# Patient Record
Sex: Female | Born: 1955 | Race: Black or African American | Hispanic: No | Marital: Single | Smoking: Never smoker
Health system: Southern US, Community
[De-identification: ages and names within clinical notes are randomized; demographics above are authoritative.]

## PROBLEM LIST (undated history)

## (undated) DIAGNOSIS — I10 Essential (primary) hypertension: Secondary | ICD-10-CM

## (undated) DIAGNOSIS — M62838 Other muscle spasm: Secondary | ICD-10-CM

## (undated) DIAGNOSIS — E119 Type 2 diabetes mellitus without complications: Secondary | ICD-10-CM

---

## 2006-06-15 ENCOUNTER — Ambulatory Visit (HOSPITAL_COMMUNITY): Admission: RE | Admit: 2006-06-15 | Discharge: 2006-06-15 | Payer: Self-pay | Admitting: Family Medicine

## 2006-09-21 ENCOUNTER — Ambulatory Visit (HOSPITAL_COMMUNITY): Admission: RE | Admit: 2006-09-21 | Discharge: 2006-09-21 | Payer: Self-pay | Admitting: Family Medicine

## 2007-06-19 ENCOUNTER — Ambulatory Visit (HOSPITAL_COMMUNITY): Admission: RE | Admit: 2007-06-19 | Discharge: 2007-06-19 | Payer: Self-pay | Admitting: Family Medicine

## 2008-08-12 ENCOUNTER — Ambulatory Visit (HOSPITAL_COMMUNITY): Admission: RE | Admit: 2008-08-12 | Discharge: 2008-08-12 | Payer: Self-pay | Admitting: Family Medicine

## 2010-08-09 ENCOUNTER — Encounter: Payer: Self-pay | Admitting: Family Medicine

## 2011-01-22 ENCOUNTER — Emergency Department (HOSPITAL_COMMUNITY)
Admission: EM | Admit: 2011-01-22 | Discharge: 2011-01-22 | Disposition: A | Discharge status: Home / Self Care | Payer: No Typology Code available for payment source | Attending: Emergency Medicine | Admitting: Emergency Medicine

## 2011-01-22 DIAGNOSIS — Y32XXXA Crashing of motor vehicle, undetermined intent, initial encounter: Secondary | ICD-10-CM | POA: Insufficient documentation

## 2011-01-22 DIAGNOSIS — IMO0002 Reserved for concepts with insufficient information to code with codable children: Secondary | ICD-10-CM | POA: Insufficient documentation

## 2011-01-22 DIAGNOSIS — M542 Cervicalgia: Secondary | ICD-10-CM | POA: Insufficient documentation

## 2011-05-05 ENCOUNTER — Other Ambulatory Visit (HOSPITAL_COMMUNITY): Payer: Self-pay | Admitting: Preventative Medicine

## 2011-05-05 DIAGNOSIS — M542 Cervicalgia: Secondary | ICD-10-CM

## 2011-05-06 ENCOUNTER — Other Ambulatory Visit (HOSPITAL_COMMUNITY): Payer: No Typology Code available for payment source

## 2011-05-07 ENCOUNTER — Ambulatory Visit (HOSPITAL_COMMUNITY)
Admission: RE | Admit: 2011-05-07 | Discharge: 2011-05-07 | Disposition: A | Discharge status: Home / Self Care | Payer: No Typology Code available for payment source | Source: Ambulatory Visit | Attending: Preventative Medicine | Admitting: Preventative Medicine

## 2011-05-07 DIAGNOSIS — M542 Cervicalgia: Secondary | ICD-10-CM | POA: Insufficient documentation

## 2011-05-07 DIAGNOSIS — R209 Unspecified disturbances of skin sensation: Secondary | ICD-10-CM | POA: Insufficient documentation

## 2012-10-03 ENCOUNTER — Telehealth (HOSPITAL_COMMUNITY): Payer: Self-pay | Admitting: Dietician

## 2012-10-03 NOTE — Telephone Encounter (Signed)
Pt registered at attend group diabetes class at [facility] on 10/03/12. However, pt was a no-show.

## 2012-10-12 ENCOUNTER — Telehealth (HOSPITAL_COMMUNITY): Payer: Self-pay | Admitting: Dietician

## 2012-10-12 NOTE — Telephone Encounter (Signed)
Pt registered at attend group diabetes class at [facility] on 10/12/12. However, pt was a no-show.   

## 2012-10-17 ENCOUNTER — Encounter (HOSPITAL_COMMUNITY): Payer: Self-pay | Admitting: Dietician

## 2012-10-17 NOTE — Progress Notes (Signed)
[facility] Diabetes Class Completion  Date:October 17, 2012  Time: 1000  Pt attended [facility]'s Diabetes Group Education Class on October 17, 2012.   Patient was educated on the following topics: survival skills (signs and symptoms of hyperglycemia and hypoglycemia, treatment for hypoglycemia, ideal levels for fasting and postprandial blood sugars, goal Hgb A1c level, foot care basics), recommendations for physical activity, carbohydrate metabolism in relation to diabetes, and meal planning (sources of carbohydrate, carbohydrate counting, meal planning strategies, food label reading, and portion control).   Melody Haver, RD, LDN

## 2013-05-29 ENCOUNTER — Other Ambulatory Visit (HOSPITAL_COMMUNITY): Payer: Self-pay | Admitting: *Deleted

## 2013-05-29 DIAGNOSIS — Z1231 Encounter for screening mammogram for malignant neoplasm of breast: Secondary | ICD-10-CM

## 2013-06-15 ENCOUNTER — Ambulatory Visit (HOSPITAL_COMMUNITY): Payer: No Typology Code available for payment source

## 2013-07-02 ENCOUNTER — Ambulatory Visit (HOSPITAL_COMMUNITY)
Admission: RE | Admit: 2013-07-02 | Discharge: 2013-07-02 | Disposition: A | Discharge status: Home / Self Care | Payer: Self-pay | Source: Ambulatory Visit | Attending: *Deleted | Admitting: *Deleted

## 2013-07-02 DIAGNOSIS — Z1231 Encounter for screening mammogram for malignant neoplasm of breast: Secondary | ICD-10-CM | POA: Insufficient documentation

## 2013-07-11 ENCOUNTER — Other Ambulatory Visit: Payer: Self-pay | Admitting: *Deleted

## 2013-07-11 DIAGNOSIS — R928 Other abnormal and inconclusive findings on diagnostic imaging of breast: Secondary | ICD-10-CM

## 2019-08-28 ENCOUNTER — Emergency Department (HOSPITAL_COMMUNITY)
Admission: EM | Admit: 2019-08-28 | Discharge: 2019-08-28 | Disposition: A | Discharge status: Home / Self Care | Payer: Self-pay | Attending: Emergency Medicine | Admitting: Emergency Medicine

## 2019-08-28 ENCOUNTER — Other Ambulatory Visit: Payer: Self-pay

## 2019-08-28 ENCOUNTER — Emergency Department (HOSPITAL_COMMUNITY): Payer: Self-pay

## 2019-08-28 ENCOUNTER — Encounter (HOSPITAL_COMMUNITY): Payer: Self-pay | Admitting: Emergency Medicine

## 2019-08-28 DIAGNOSIS — W08XXXA Fall from other furniture, initial encounter: Secondary | ICD-10-CM | POA: Insufficient documentation

## 2019-08-28 DIAGNOSIS — Y999 Unspecified external cause status: Secondary | ICD-10-CM | POA: Insufficient documentation

## 2019-08-28 DIAGNOSIS — M25531 Pain in right wrist: Secondary | ICD-10-CM | POA: Insufficient documentation

## 2019-08-28 DIAGNOSIS — M25511 Pain in right shoulder: Secondary | ICD-10-CM | POA: Insufficient documentation

## 2019-08-28 DIAGNOSIS — Z7984 Long term (current) use of oral hypoglycemic drugs: Secondary | ICD-10-CM | POA: Insufficient documentation

## 2019-08-28 DIAGNOSIS — Y92099 Unspecified place in other non-institutional residence as the place of occurrence of the external cause: Secondary | ICD-10-CM | POA: Insufficient documentation

## 2019-08-28 DIAGNOSIS — E119 Type 2 diabetes mellitus without complications: Secondary | ICD-10-CM | POA: Insufficient documentation

## 2019-08-28 DIAGNOSIS — Y9389 Activity, other specified: Secondary | ICD-10-CM | POA: Insufficient documentation

## 2019-08-28 HISTORY — DX: Type 2 diabetes mellitus without complications: E11.9

## 2019-08-28 HISTORY — DX: Other muscle spasm: M62.838

## 2019-08-28 HISTORY — DX: Essential (primary) hypertension: I10

## 2019-08-28 NOTE — ED Triage Notes (Signed)
PT states she fell about 3 weeks ago and since then has been having right shoulder and right wrist pain with ROM.

## 2019-08-28 NOTE — Discharge Instructions (Addendum)
Your xrays are negative for any acute injury from your fall or any arthritis in your shoulder or wrist.  It is possible you have a muscle or tendon injury which will not show up on the tests we have available in the emergency department.  I recommend continuing your naproxen and your muscle relaxer.  I also recommend taking a tylenol (acetaminophen) 650 mg tablet every 12 hours also for pain relief.  Apply a heating pad for 15-20 minutes three time daily.  Call the orthopedist listed above if your symptoms do not improve with this treatment over the next 10-14 days.
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
## 2021-02-23 ENCOUNTER — Encounter: Payer: Self-pay | Admitting: *Deleted

## 2021-02-24 ENCOUNTER — Telehealth: Payer: Self-pay | Admitting: *Deleted

## 2021-02-24 ENCOUNTER — Ambulatory Visit: Payer: Self-pay | Admitting: Neurology

## 2021-02-24 ENCOUNTER — Encounter: Payer: Self-pay | Admitting: Neurology

## 2021-02-24 NOTE — Telephone Encounter (Signed)
Patient no showed appt with Dr Frances Furbish this AM. This is her first no show at [facility].

## 2021-02-25 ENCOUNTER — Ambulatory Visit: Payer: Self-pay | Admitting: Neurology

## 2021-03-29 IMAGING — DX DG SHOULDER 2+V*R*
3 series · 3 of 3 positions shown · non-contrast
Comparison: None.

CLINICAL DATA: Fall 3 weeks ago.  Persistent right shoulder pain.

EXAM:
RIGHT SHOULDER - 2+ VIEW

[shoulder grashey]
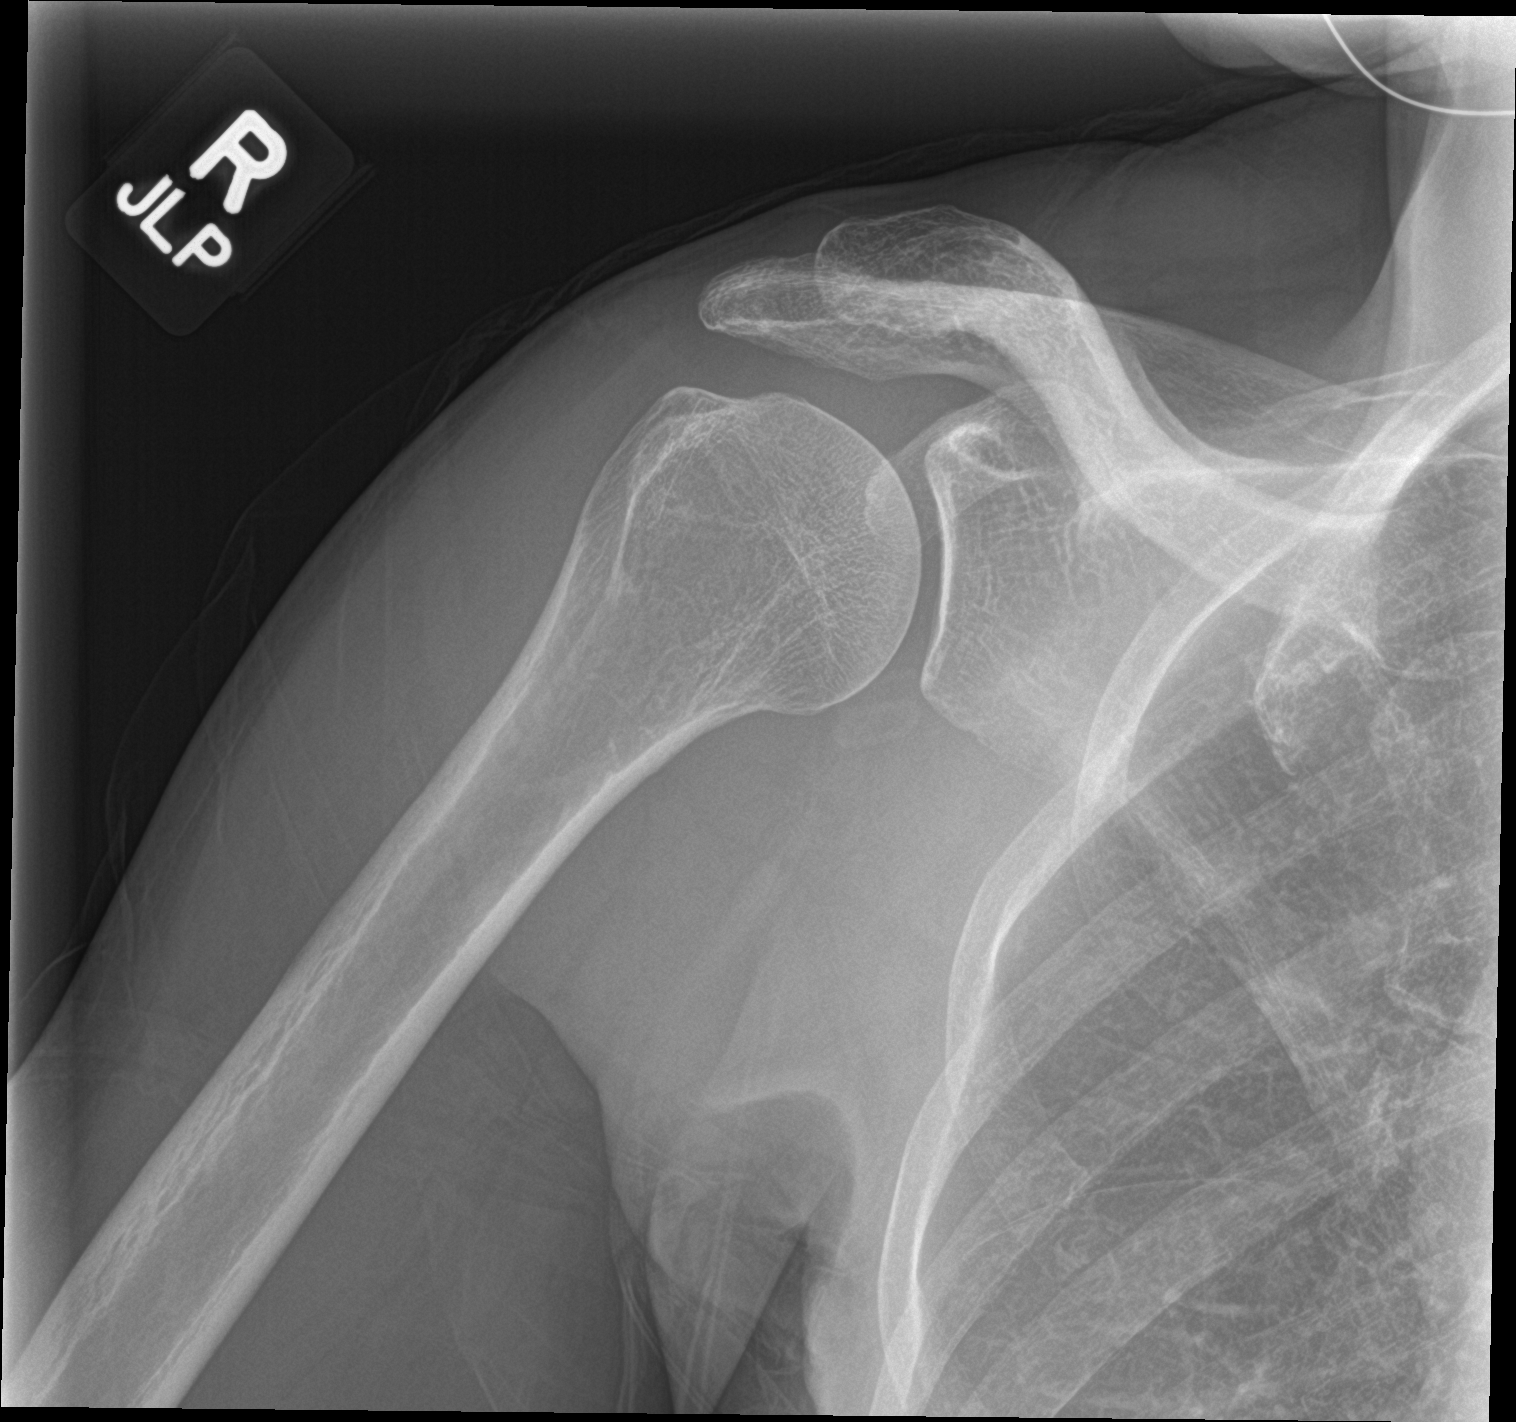

[shoulder y view]
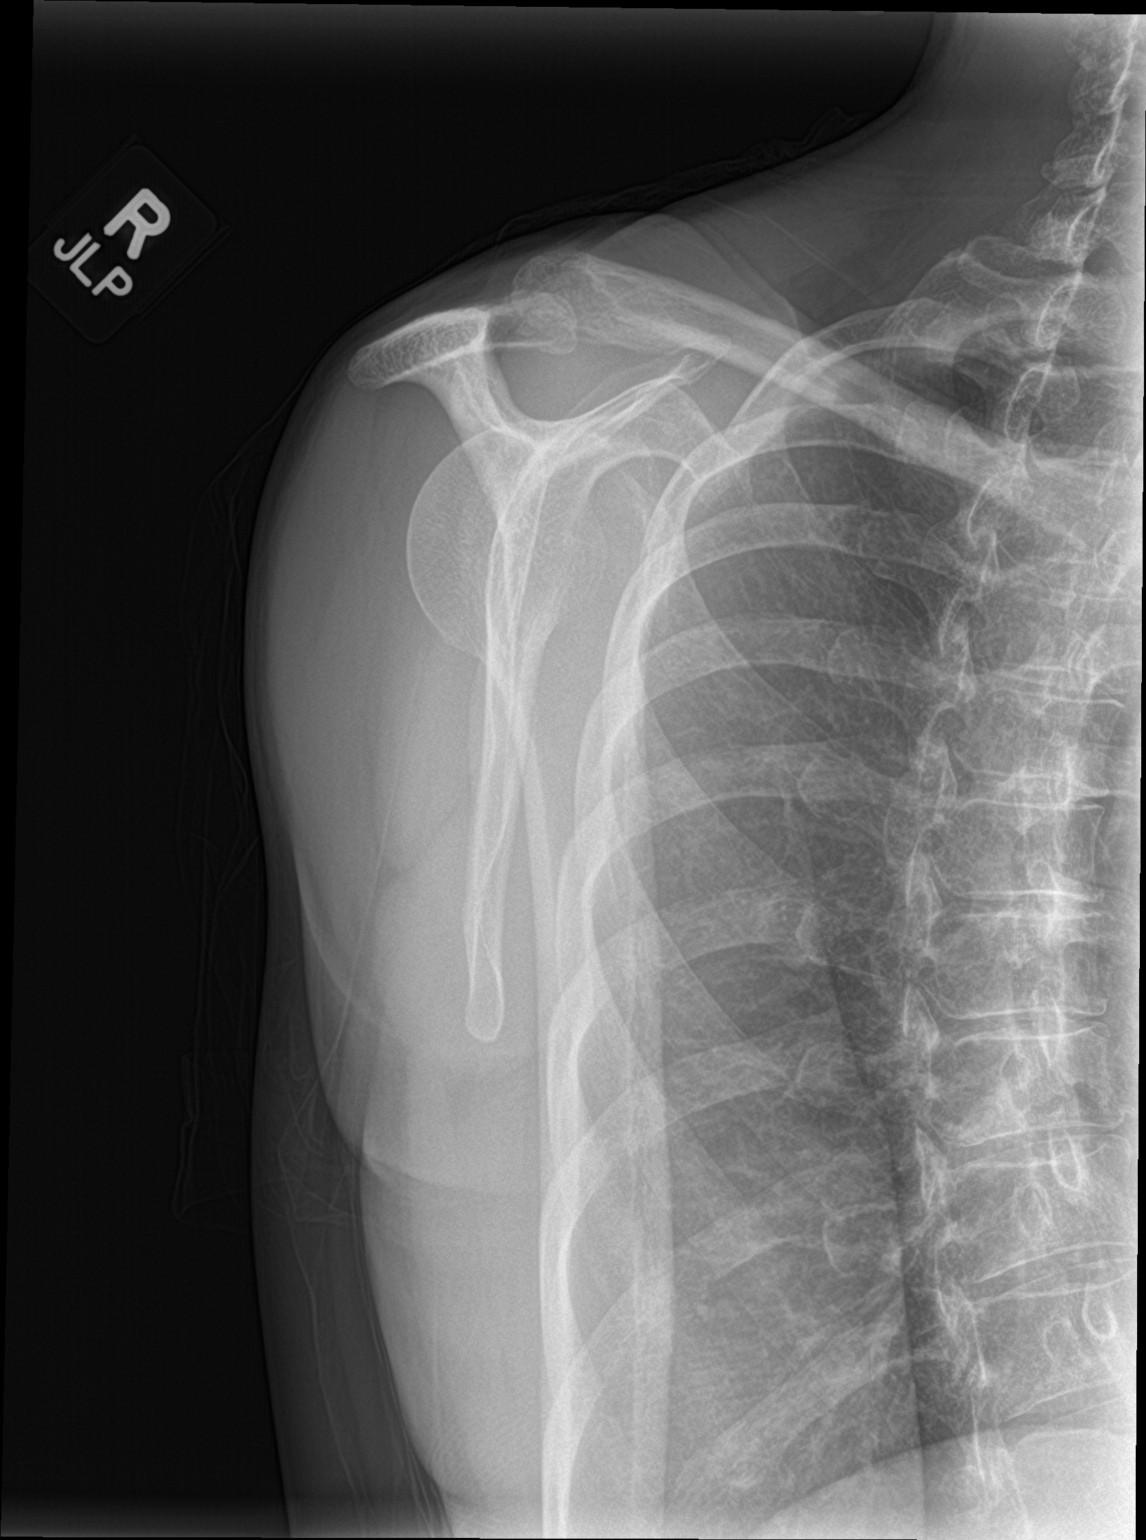

[shoulder axillary]
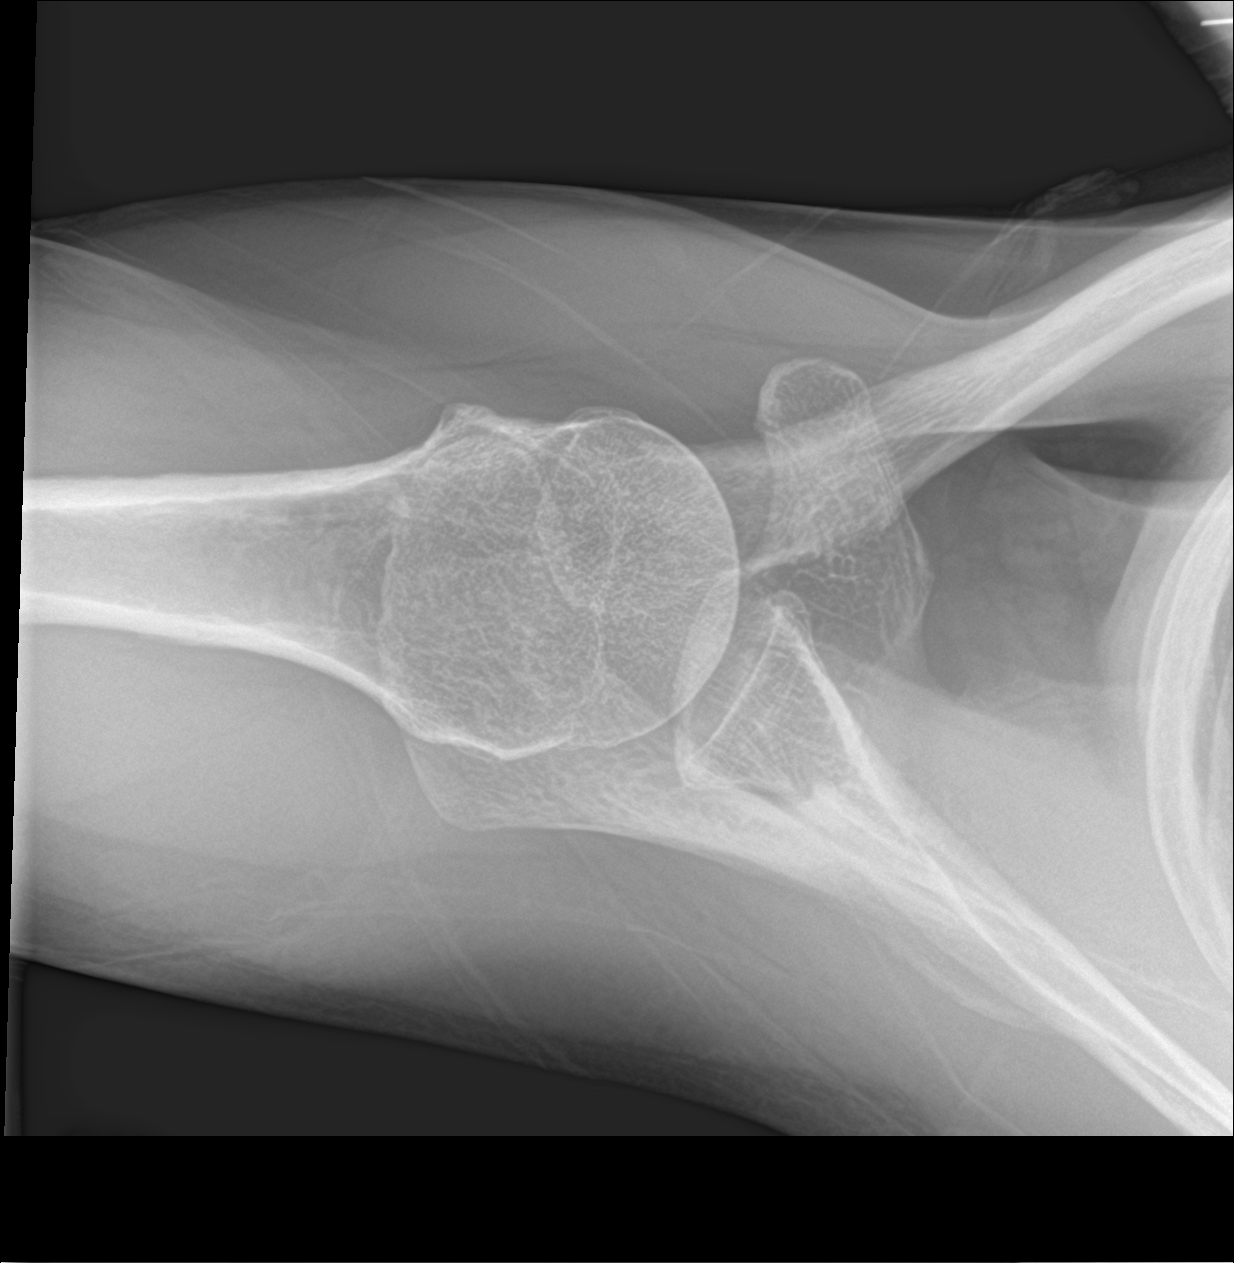

[3 of 3 positions shown; findings below may reference images not displayed]

FINDINGS: There is no evidence of fracture or dislocation. There is no
evidence of arthropathy or other focal bone abnormality. Soft
tissues are unremarkable.
IMPRESSION: Negative right shoulder radiographs

## 2023-08-12 ENCOUNTER — Emergency Department (HOSPITAL_COMMUNITY): Payer: Medicare Other

## 2023-08-12 ENCOUNTER — Encounter (HOSPITAL_COMMUNITY): Payer: Self-pay | Admitting: *Deleted

## 2023-08-12 ENCOUNTER — Inpatient Hospital Stay (HOSPITAL_COMMUNITY)
Admission: EM | Admit: 2023-08-12 | Discharge: 2023-09-13 | DRG: 689 | Disposition: A | Discharge status: Skilled Nursing Facility | Payer: Medicare Other | Attending: Internal Medicine | Admitting: Internal Medicine

## 2023-08-12 ENCOUNTER — Other Ambulatory Visit: Payer: Self-pay

## 2023-08-12 DIAGNOSIS — E538 Deficiency of other specified B group vitamins: Secondary | ICD-10-CM | POA: Diagnosis present

## 2023-08-12 DIAGNOSIS — N390 Urinary tract infection, site not specified: Secondary | ICD-10-CM | POA: Diagnosis not present

## 2023-08-12 DIAGNOSIS — F03C0 Unspecified dementia, severe, without behavioral disturbance, psychotic disturbance, mood disturbance, and anxiety: Secondary | ICD-10-CM | POA: Insufficient documentation

## 2023-08-12 DIAGNOSIS — Z91010 Allergy to peanuts: Secondary | ICD-10-CM | POA: Diagnosis not present

## 2023-08-12 DIAGNOSIS — Z7984 Long term (current) use of oral hypoglycemic drugs: Secondary | ICD-10-CM | POA: Diagnosis not present

## 2023-08-12 DIAGNOSIS — F05 Delirium due to known physiological condition: Secondary | ICD-10-CM | POA: Diagnosis present

## 2023-08-12 DIAGNOSIS — Z9181 History of falling: Secondary | ICD-10-CM

## 2023-08-12 DIAGNOSIS — E860 Dehydration: Secondary | ICD-10-CM | POA: Diagnosis present

## 2023-08-12 DIAGNOSIS — Z886 Allergy status to analgesic agent status: Secondary | ICD-10-CM | POA: Diagnosis not present

## 2023-08-12 DIAGNOSIS — Z5919 Other inadequate housing: Secondary | ICD-10-CM | POA: Diagnosis not present

## 2023-08-12 DIAGNOSIS — R569 Unspecified convulsions: Secondary | ICD-10-CM | POA: Diagnosis not present

## 2023-08-12 DIAGNOSIS — Z79899 Other long term (current) drug therapy: Secondary | ICD-10-CM | POA: Diagnosis not present

## 2023-08-12 DIAGNOSIS — F0390 Unspecified dementia without behavioral disturbance: Secondary | ICD-10-CM | POA: Diagnosis not present

## 2023-08-12 DIAGNOSIS — Z91013 Allergy to seafood: Secondary | ICD-10-CM

## 2023-08-12 DIAGNOSIS — Z880 Allergy status to penicillin: Secondary | ICD-10-CM | POA: Diagnosis not present

## 2023-08-12 DIAGNOSIS — N179 Acute kidney failure, unspecified: Secondary | ICD-10-CM | POA: Diagnosis not present

## 2023-08-12 DIAGNOSIS — Z781 Physical restraint status: Secondary | ICD-10-CM

## 2023-08-12 DIAGNOSIS — N3001 Acute cystitis with hematuria: Principal | ICD-10-CM | POA: Diagnosis present

## 2023-08-12 DIAGNOSIS — R410 Disorientation, unspecified: Secondary | ICD-10-CM

## 2023-08-12 DIAGNOSIS — R4189 Other symptoms and signs involving cognitive functions and awareness: Secondary | ICD-10-CM | POA: Diagnosis not present

## 2023-08-12 DIAGNOSIS — E119 Type 2 diabetes mellitus without complications: Secondary | ICD-10-CM | POA: Diagnosis present

## 2023-08-12 DIAGNOSIS — D509 Iron deficiency anemia, unspecified: Secondary | ICD-10-CM | POA: Diagnosis present

## 2023-08-12 DIAGNOSIS — I10 Essential (primary) hypertension: Secondary | ICD-10-CM | POA: Diagnosis present

## 2023-08-12 DIAGNOSIS — L89022 Pressure ulcer of left elbow, stage 2: Secondary | ICD-10-CM | POA: Diagnosis present

## 2023-08-12 DIAGNOSIS — M62838 Other muscle spasm: Secondary | ICD-10-CM | POA: Diagnosis present

## 2023-08-12 DIAGNOSIS — F03C18 Unspecified dementia, severe, with other behavioral disturbance: Secondary | ICD-10-CM | POA: Diagnosis present

## 2023-08-12 DIAGNOSIS — I959 Hypotension, unspecified: Secondary | ICD-10-CM | POA: Diagnosis not present

## 2023-08-12 DIAGNOSIS — L89012 Pressure ulcer of right elbow, stage 2: Secondary | ICD-10-CM | POA: Diagnosis present

## 2023-08-12 DIAGNOSIS — Z881 Allergy status to other antibiotic agents status: Secondary | ICD-10-CM | POA: Diagnosis not present

## 2023-08-12 DIAGNOSIS — L8990 Pressure ulcer of unspecified site, unspecified stage: Secondary | ICD-10-CM | POA: Diagnosis present

## 2023-08-12 DIAGNOSIS — G9341 Metabolic encephalopathy: Secondary | ICD-10-CM | POA: Diagnosis present

## 2023-08-12 DIAGNOSIS — W57XXXA Bitten or stung by nonvenomous insect and other nonvenomous arthropods, initial encounter: Secondary | ICD-10-CM | POA: Diagnosis present

## 2023-08-12 DIAGNOSIS — N3000 Acute cystitis without hematuria: Secondary | ICD-10-CM | POA: Diagnosis not present

## 2023-08-12 DIAGNOSIS — Z91018 Allergy to other foods: Secondary | ICD-10-CM

## 2023-08-12 DIAGNOSIS — E1165 Type 2 diabetes mellitus with hyperglycemia: Secondary | ICD-10-CM | POA: Diagnosis not present

## 2023-08-12 LAB — RAPID URINE DRUG SCREEN, HOSP PERFORMED
Amphetamines: NOT DETECTED
Barbiturates: NOT DETECTED
Benzodiazepines: POSITIVE — AB
Cocaine: NOT DETECTED
Opiates: NOT DETECTED
Tetrahydrocannabinol: NOT DETECTED

## 2023-08-12 LAB — CBC WITH DIFFERENTIAL/PLATELET
Abs Immature Granulocytes: 0 10*3/uL (ref 0.00–0.07)
Basophils Absolute: 0.1 10*3/uL (ref 0.0–0.1)
Basophils Relative: 1 %
Eosinophils Absolute: 0.2 10*3/uL (ref 0.0–0.5)
Eosinophils Relative: 4 %
HCT: 35.9 % — ABNORMAL LOW (ref 36.0–46.0)
Hemoglobin: 12.4 g/dL (ref 12.0–15.0)
Immature Granulocytes: 0 %
Lymphocytes Relative: 43 %
Lymphs Abs: 2.5 10*3/uL (ref 0.7–4.0)
MCH: 27 pg (ref 26.0–34.0)
MCHC: 34.5 g/dL (ref 30.0–36.0)
MCV: 78.2 fL — ABNORMAL LOW (ref 80.0–100.0)
Monocytes Absolute: 0.3 10*3/uL (ref 0.1–1.0)
Monocytes Relative: 5 %
Neutro Abs: 2.7 10*3/uL (ref 1.7–7.7)
Neutrophils Relative %: 47 %
Platelets: 272 10*3/uL (ref 150–400)
RBC: 4.59 MIL/uL (ref 3.87–5.11)
RDW: 14.4 % (ref 11.5–15.5)
WBC: 5.8 10*3/uL (ref 4.0–10.5)
nRBC: 0 % (ref 0.0–0.2)

## 2023-08-12 LAB — URINALYSIS, ROUTINE W REFLEX MICROSCOPIC
Bilirubin Urine: NEGATIVE
Glucose, UA: NEGATIVE mg/dL
Hgb urine dipstick: NEGATIVE
Ketones, ur: NEGATIVE mg/dL
Nitrite: NEGATIVE
Protein, ur: 30 mg/dL — AB
Specific Gravity, Urine: 1.021 (ref 1.005–1.030)
pH: 6 (ref 5.0–8.0)

## 2023-08-12 LAB — COMPREHENSIVE METABOLIC PANEL
ALT: 18 U/L (ref 0–44)
AST: 28 U/L (ref 15–41)
Albumin: 3.8 g/dL (ref 3.5–5.0)
Alkaline Phosphatase: 66 U/L (ref 38–126)
Anion gap: 8 (ref 5–15)
BUN: 14 mg/dL (ref 8–23)
CO2: 25 mmol/L (ref 22–32)
Calcium: 9.3 mg/dL (ref 8.9–10.3)
Chloride: 105 mmol/L (ref 98–111)
Creatinine, Ser: 0.89 mg/dL (ref 0.44–1.00)
GFR, Estimated: >60 mL/min (ref >60)
Glucose, Bld: 108 mg/dL — ABNORMAL HIGH (ref 70–99)
Potassium: 3.9 mmol/L (ref 3.5–5.1)
Sodium: 138 mmol/L (ref 135–145)
Total Bilirubin: 0.7 mg/dL (ref 0.0–1.2)
Total Protein: 8.5 g/dL — ABNORMAL HIGH (ref 6.5–8.1)

## 2023-08-12 LAB — CBG MONITORING, ED: Glucose-Capillary: 106 mg/dL — ABNORMAL HIGH (ref 70–99)

## 2023-08-12 LAB — ETHANOL: Alcohol, Ethyl (B): <10 mg/dL (ref <10)

## 2023-08-12 MED ORDER — ZIPRASIDONE MESYLATE 20 MG IM SOLR
10.0000 mg | Freq: Once | INTRAMUSCULAR | Status: AC
  Administered 2023-08-12: 10 mg via INTRAMUSCULAR
  Filled 2023-08-12: qty 20

## 2023-08-12 MED ORDER — DIPHENHYDRAMINE HCL 25 MG PO CAPS
25.0000 mg | ORAL_CAPSULE | Freq: Once | ORAL | Status: AC
  Administered 2023-08-13: 25 mg via ORAL
  Filled 2023-08-12: qty 1

## 2023-08-12 MED ORDER — SODIUM CHLORIDE 0.9 % IV SOLN
2.0000 g | Freq: Once | INTRAVENOUS | Status: AC
  Administered 2023-08-12: 2 g via INTRAVENOUS
  Filled 2023-08-12: qty 20

## 2023-08-12 NOTE — ED Triage Notes (Signed)
Pt brought in due to confusion and visual and auditory hallucinations.  Pt with known bed bugs and bites all over.  POA reports that pt is not showering or taking her medications

## 2023-08-12 NOTE — H&P (Signed)
History and Physical    Patient: Shannon Barrett ZOX:[id number] DOB: Mar 10, 1956 DOA: 08/12/2023 DOS: the patient was seen and examined on 08/13/2023 PCP: [facility]  Patient coming from: Home  Chief Complaint:  Chief Complaint  Patient presents with   Altered Mental Status   HPI: Shannon Barrett is a 68 y.o. female with medical history significant of hypertension, T2DM who presents to the emergency department due to confusion, visual and auditory hallucinations.  Patient was confused and was unable to provide history, history was obtained from ED physician and ED medical record.  Per report, patient has not been compliant with home medications and has not been taking showers.  She was reported to get lost whenever she goes out.  No further history was obtainable at this time.  ED Course:  In the emergency department, BP was 176/81, other vital signs are within normal range.  Workup in the ED showed microcytic anemia, BMP was normal except for blood glucose of 108.  Urinalysis was positive for large leukocytes and 21-50 WBC, urine drug screen was positive for benzodiazepine, alcohol level was less than 10. CT of head without contrast showed no evidence of acute intracranial abnormality. Chest x-ray showed no active cardiopulmonary disease. IM Geodon 10 mg x 1, IM Haldol 2 mg x 1 was given due to agitation, Benadryl 25 mg p.o. x 1 was given and IV ceftriaxone due to presumed UTI was given. Hospitalist was asked to admit patient for further evaluation and management.  Review of Systems: Review of systems as noted in the HPI. All other systems reviewed and are negative.   Past Medical History:  Diagnosis Date   Diabetes mellitus without complication (HCC)    Hypertension    Night muscle spasms    History reviewed. No pertinent surgical history.  Social History:  reports that she has never smoked. She has never used smokeless tobacco. She reports that she does  not drink alcohol and does not use drugs.   Allergies  Allergen Reactions   Amoxicillin    Aspirin Hives   Claritin [Loratadine]    Ibuprofen    Peanut-Containing Drug Products    Penicillins    Shellfish-Derived Products    Strawberry (Diagnostic)     History reviewed. No pertinent family history.    Prior to Admission medications   Medication Sig Start Date End Date Taking? Authorizing Provider  albuterol (PROVENTIL HFA) 108 (90 Base) MCG/ACT inhaler Inhale 2 puffs into the lungs every 4 (four) hours as needed for wheezing or shortness of breath.    [provider]  cetirizine (ZYRTEC) 10 MG tablet Take 10 mg by mouth daily.    [provider]  cyclobenzaprine (FLEXERIL) 10 MG tablet Take 10 mg by mouth 2 (two) times daily as needed for muscle spasms.    [provider]  lisinopril-hydrochlorothiazide (ZESTORETIC) 20-12.5 MG tablet Take 1 tablet by mouth daily.    [provider]  metFORMIN (GLUCOPHAGE-XR) 500 MG 24 hr tablet Take 500 mg by mouth in the morning and at bedtime.    [provider]  naproxen (NAPROSYN) 500 MG tablet Take 500 mg by mouth 2 (two) times daily as needed.    [provider]    Physical Exam: BP (!) 185/85   Pulse 66   Temp 97.9 F (36.6 C) (Oral)   Resp 17   Ht 5\' 2"  (1.575 m)   SpO2 100%   BMI 26.16 kg/m   General: 68 y.o.  year-old female ill appearing, somnolent, arousable, though quickly goes back to sleep and in no acute distress.  HEENT: NCAT, PERRL Neck: Supple, trachea medial Cardiovascular: Regular rate and rhythm with no rubs or gallops.  No thyromegaly or JVD noted.  No lower extremity edema. 2/4 pulses in all 4 extremities. Respiratory: Clear to auscultation with no wheezes or rales. Good inspiratory effort. Abdomen: Soft, nontender nondistended with normal bowel sounds x4 quadrants. Muskuloskeletal: No cyanosis, clubbing or edema noted bilaterally Neuro: Patient moving all  extremities, no focal neurologic deficit Skin: No ulcerative lesions noted or rashes Psychiatry: Mood is appropriate for condition and setting          Labs on Admission:  Basic Metabolic Panel: Recent Labs  Lab 08/12/23 1839  NA 138  K 3.9  CL 105  CO2 25  GLUCOSE 108*  BUN 14  CREATININE 0.89  CALCIUM 9.3   Liver Function Tests: Recent Labs  Lab 08/12/23 1839  AST 28  ALT 18  ALKPHOS 66  BILITOT 0.7  PROT 8.5*  ALBUMIN 3.8   No results for input(s): "LIPASE", "AMYLASE" in the last 168 hours. No results for input(s): "AMMONIA" in the last 168 hours. CBC: Recent Labs  Lab 08/12/23 1839  WBC 5.8  NEUTROABS 2.7  HGB 12.4  HCT 35.9*  MCV 78.2*  PLT 272   Cardiac Enzymes: No results for input(s): "CKTOTAL", "CKMB", "CKMBINDEX", "TROPONINI" in the last 168 hours.  BNP (last 3 results) No results for input(s): "BNP" in the last 8760 hours.  ProBNP (last 3 results) No results for input(s): "PROBNP" in the last 8760 hours.  CBG: Recent Labs  Lab 08/12/23 1844  GLUCAP 106*    Radiological Exams on Admission: CT Head Wo Contrast Result Date: 08/12/2023 CLINICAL DATA:  Mental status change confusion EXAM: CT HEAD WITHOUT CONTRAST TECHNIQUE: Contiguous axial images were obtained from the base of the skull through the vertex without intravenous contrast. RADIATION DOSE REDUCTION: This exam was performed according to the departmental dose-optimization program which includes automated exposure control, adjustment of the mA and/or kV according to patient size and/or use of iterative reconstruction technique. COMPARISON:  None Available. FINDINGS: Brain: No acute territorial infarction, hemorrhage or intracranial mass. Mild atrophy. Minimal white matter hypodensity. Nonenlarged ventricles Vascular: No hyperdense vessels.  No unexpected calcification Skull: Normal. Negative for fracture or focal lesion. Sinuses/Orbits: No acute finding. Other: None IMPRESSION: No CT  evidence for acute intracranial abnormality. Mild atrophy. Electronically Signed   By: Jasmine Pang M.D.   On: 08/12/2023 19:57   DG Chest 2 View Result Date: 08/12/2023 CLINICAL DATA:  Weakness and confusion. EXAM: CHEST - 2 VIEW COMPARISON:  None Available. FINDINGS: The cardiomediastinal contours are normal. Pulmonary vasculature is normal. No consolidation, pleural effusion, or pneumothorax. No acute osseous abnormalities are seen. IMPRESSION: No active cardiopulmonary disease. Electronically Signed   By: Narda Rutherford M.D.   On: 08/12/2023 18:25    EKG: I independently viewed the EKG done and my findings are as followed: EKG was not done in the ED  Assessment/Plan Present on Admission:  UTI (urinary tract infection)  Principal Problem:   UTI (urinary tract infection) Active Problems:   Acute metabolic encephalopathy   Microcytic anemia   Essential hypertension   Type 2 diabetes mellitus (HCC)  Acute metabolic encephalopathy possibly secondary to presumed UTI POA Patient was started on IV ceftriaxone, we shall continue with same at this time with plan to de-escalate/discontinue based on urine culture. Vitamin B12, TSH to  rule out reversible dementia will be checked CNS acting drugs will be held at this time Continue fall precaution Consider psychiatry evaluation if patient continues to remain confused despite UTI being treated  Microcytic anemia MCV 78.2, hematocrit 35.9 Iron studies will be done  Essential hypertension (uncontrolled) Continue IV hydralazine 10 mg every 6 hours as needed for SBP > 170 Continue home Zestoretic  Type 2 diabetes mellitus Continue ISS and hypoglycemia protocol Metformin will be held at this time   DVT prophylaxis: Lovenox   Family Communication: None at bedside    Advance Care Planning:   Code Status: Full Code   Consults: None  Severity of Illness: The appropriate patient status for this patient is INPATIENT. Inpatient status  is judged to be reasonable and necessary in order to provide the required intensity of service to ensure the patient's safety. The patient's presenting symptoms, physical exam findings, and initial radiographic and laboratory data in the context of their chronic comorbidities is felt to place them at high risk for further clinical deterioration. Furthermore, it is not anticipated that the patient will be medically stable for discharge from the hospital within 2 midnights of admission.   * I certify that at the point of admission it is my clinical judgment that the patient will require inpatient hospital care spanning beyond 2 midnights from the point of admission due to high intensity of service, high risk for further deterioration and high frequency of surveillance required.*  Author: Frankey Shown, DO 08/13/2023 5:50 AM  For on call review [email or web address].

## 2023-08-12 NOTE — ED Notes (Signed)
Pt is standing in her room, she has neatly made her bed. Family thinks they are making her confusion worse. Provided reassurance that it is likely the environment and not them.

## 2023-08-12 NOTE — ED Notes (Signed)
Blood sugar 106

## 2023-08-12 NOTE — ED Notes (Signed)
Patient remains very restless despite IM Geodon. Hospitalist messaged for something else to try to help patient. Patient still trying to get out of bed

## 2023-08-13 ENCOUNTER — Other Ambulatory Visit (HOSPITAL_COMMUNITY): Payer: Medicare Other

## 2023-08-13 DIAGNOSIS — G9341 Metabolic encephalopathy: Secondary | ICD-10-CM | POA: Insufficient documentation

## 2023-08-13 DIAGNOSIS — R410 Disorientation, unspecified: Secondary | ICD-10-CM | POA: Diagnosis not present

## 2023-08-13 DIAGNOSIS — I10 Essential (primary) hypertension: Secondary | ICD-10-CM | POA: Insufficient documentation

## 2023-08-13 DIAGNOSIS — D509 Iron deficiency anemia, unspecified: Secondary | ICD-10-CM | POA: Diagnosis not present

## 2023-08-13 DIAGNOSIS — N3000 Acute cystitis without hematuria: Secondary | ICD-10-CM

## 2023-08-13 DIAGNOSIS — E119 Type 2 diabetes mellitus without complications: Secondary | ICD-10-CM

## 2023-08-13 DIAGNOSIS — E1165 Type 2 diabetes mellitus with hyperglycemia: Secondary | ICD-10-CM

## 2023-08-13 LAB — GLUCOSE, CAPILLARY
Glucose-Capillary: 85 mg/dL (ref 70–99)
Glucose-Capillary: 88 mg/dL (ref 70–99)
Glucose-Capillary: 88 mg/dL (ref 70–99)
Glucose-Capillary: 117 mg/dL — ABNORMAL HIGH (ref 70–99)

## 2023-08-13 LAB — HIV ANTIBODY (ROUTINE TESTING W REFLEX): HIV Screen 4th Generation wRfx: NONREACTIVE

## 2023-08-13 LAB — CBC
HCT: 35.7 % — ABNORMAL LOW (ref 36.0–46.0)
Hemoglobin: 11.9 g/dL — ABNORMAL LOW (ref 12.0–15.0)
MCH: 26.3 pg (ref 26.0–34.0)
MCHC: 33.3 g/dL (ref 30.0–36.0)
MCV: 78.8 fL — ABNORMAL LOW (ref 80.0–100.0)
Platelets: 262 10*3/uL (ref 150–400)
RBC: 4.53 MIL/uL (ref 3.87–5.11)
RDW: 14.1 % (ref 11.5–15.5)
WBC: 5 10*3/uL (ref 4.0–10.5)
nRBC: 0 % (ref 0.0–0.2)

## 2023-08-13 LAB — TSH: TSH: 3.854 u[IU]/mL (ref 0.350–4.500)

## 2023-08-13 LAB — COMPREHENSIVE METABOLIC PANEL
ALT: 17 U/L (ref 0–44)
AST: 25 U/L (ref 15–41)
Albumin: 3.7 g/dL (ref 3.5–5.0)
Alkaline Phosphatase: 66 U/L (ref 38–126)
Anion gap: 8 (ref 5–15)
BUN: 14 mg/dL (ref 8–23)
CO2: 23 mmol/L (ref 22–32)
Calcium: 9.2 mg/dL (ref 8.9–10.3)
Chloride: 108 mmol/L (ref 98–111)
Creatinine, Ser: 0.81 mg/dL (ref 0.44–1.00)
GFR, Estimated: >60 mL/min (ref >60)
Glucose, Bld: 103 mg/dL — ABNORMAL HIGH (ref 70–99)
Potassium: 3.6 mmol/L (ref 3.5–5.1)
Sodium: 139 mmol/L (ref 135–145)
Total Bilirubin: 0.2 mg/dL (ref 0.0–1.2)
Total Protein: 7.8 g/dL (ref 6.5–8.1)

## 2023-08-13 LAB — VITAMIN B12: Vitamin B-12: 305 pg/mL (ref 180–914)

## 2023-08-13 LAB — SEDIMENTATION RATE: Sed Rate: 37 mm/h — ABNORMAL HIGH (ref 0–22)

## 2023-08-13 LAB — PHOSPHORUS: Phosphorus: 3.3 mg/dL (ref 2.5–4.6)

## 2023-08-13 LAB — FERRITIN: Ferritin: 36 ng/mL (ref 11–307)

## 2023-08-13 LAB — C-REACTIVE PROTEIN: CRP: 0.7 mg/dL

## 2023-08-13 LAB — IRON AND TIBC
Iron: 41 ug/dL (ref 28–170)
Saturation Ratios: 12 % (ref 10.4–31.8)
TIBC: 340 ug/dL (ref 250–450)
UIBC: 299 ug/dL

## 2023-08-13 LAB — MAGNESIUM: Magnesium: 2.1 mg/dL (ref 1.7–2.4)

## 2023-08-13 LAB — FOLATE: Folate: 15.8 ng/mL (ref >5.9)

## 2023-08-13 MED ORDER — LORAZEPAM 2 MG/ML IJ SOLN
2.0000 mg | Freq: Once | INTRAMUSCULAR | Status: AC | PRN
  Administered 2023-08-13: 2 mg via INTRAVENOUS
  Filled 2023-08-13: qty 1

## 2023-08-13 MED ORDER — LISINOPRIL-HYDROCHLOROTHIAZIDE 20-12.5 MG PO TABS: 1.0000 | ORAL_TABLET | Freq: Every day | ORAL | Status: DC

## 2023-08-13 MED ORDER — ENOXAPARIN SODIUM 40 MG/0.4ML IJ SOSY
40.0000 mg | PREFILLED_SYRINGE | INTRAMUSCULAR | Status: DC
  Administered 2023-08-13 – 2023-08-14 (×2): 40 mg via SUBCUTANEOUS
  Filled 2023-08-13 (×2): qty 0.4

## 2023-08-13 MED ORDER — ACETAMINOPHEN 325 MG PO TABS
650.0000 mg | ORAL_TABLET | Freq: Four times a day (QID) | ORAL | Status: DC | PRN
Start: 2023-08-13 — End: 2023-09-13
  Administered 2023-08-18 – 2023-09-06 (×6): 650 mg via ORAL
  Filled 2023-08-13 (×8): qty 2

## 2023-08-13 MED ORDER — CYANOCOBALAMIN 1000 MCG/ML IJ SOLN
1000.0000 ug | Freq: Every day | INTRAMUSCULAR | Status: AC
  Administered 2023-08-14 – 2023-08-16 (×3): 1000 ug via INTRAMUSCULAR
  Filled 2023-08-13 (×4): qty 1

## 2023-08-13 MED ORDER — HYDROCHLOROTHIAZIDE 12.5 MG PO TABS
12.5000 mg | ORAL_TABLET | Freq: Every day | ORAL | Status: DC
  Administered 2023-08-14: 12.5 mg via ORAL
  Filled 2023-08-13 (×3): qty 1

## 2023-08-13 MED ORDER — OLANZAPINE 5 MG PO TABS
2.5000 mg | ORAL_TABLET | Freq: Every morning | ORAL | Status: DC
Start: 2023-08-14 — End: 2023-09-02
  Administered 2023-08-14 – 2023-09-02 (×18): 2.5 mg via ORAL
  Filled 2023-08-13 (×22): qty 1

## 2023-08-13 MED ORDER — OLANZAPINE 5 MG PO TABS
5.0000 mg | ORAL_TABLET | Freq: Every day | ORAL | Status: DC
Start: 2023-08-13 — End: 2023-09-02
  Administered 2023-08-14 – 2023-09-01 (×18): 5 mg via ORAL
  Filled 2023-08-13 (×19): qty 1

## 2023-08-13 MED ORDER — INSULIN ASPART 100 UNIT/ML IJ SOLN
0.0000 [IU] | Freq: Three times a day (TID) | INTRAMUSCULAR | Status: DC
  Administered 2023-08-15 – 2023-08-16 (×3): 1 [IU] via SUBCUTANEOUS
  Administered 2023-08-17: 2 [IU] via SUBCUTANEOUS
  Administered 2023-08-17 – 2023-08-18 (×2): 1 [IU] via SUBCUTANEOUS
  Administered 2023-08-18: 2 [IU] via SUBCUTANEOUS

## 2023-08-13 MED ORDER — AMLODIPINE BESYLATE 10 MG PO TABS
10.0000 mg | ORAL_TABLET | Freq: Every day | ORAL | Status: DC
Start: 2023-08-13 — End: 2023-09-02
  Administered 2023-08-14 – 2023-09-02 (×16): 10 mg via ORAL
  Filled 2023-08-13 (×11): qty 1
  Filled 2023-08-13: qty 2
  Filled 2023-08-13 (×7): qty 1

## 2023-08-13 MED ORDER — SODIUM CHLORIDE 0.9 % IV SOLN
1.0000 g | INTRAVENOUS | Status: DC
  Administered 2023-08-13 – 2023-08-15 (×3): 1 g via INTRAVENOUS
  Filled 2023-08-13 (×3): qty 10

## 2023-08-13 MED ORDER — ONDANSETRON HCL 4 MG/2ML IJ SOLN: 4.0000 mg | Freq: Four times a day (QID) | INTRAMUSCULAR | Status: DC | PRN

## 2023-08-13 MED ORDER — ACETAMINOPHEN 650 MG RE SUPP: 650.0000 mg | Freq: Four times a day (QID) | RECTAL | Status: DC | PRN

## 2023-08-13 MED ORDER — VITAMIN B-12 1000 MCG PO TABS
1000.0000 ug | ORAL_TABLET | Freq: Every day | ORAL | Status: DC
  Administered 2023-08-17 – 2023-09-10 (×22): 1000 ug via ORAL
  Filled 2023-08-13 (×25): qty 1

## 2023-08-13 MED ORDER — LISINOPRIL 20 MG PO TABS
20.0000 mg | ORAL_TABLET | Freq: Every day | ORAL | Status: DC
  Administered 2023-08-14: 20 mg via ORAL
  Filled 2023-08-13 (×2): qty 1
  Filled 2023-08-13: qty 2

## 2023-08-13 MED ORDER — HYDRALAZINE HCL 20 MG/ML IJ SOLN: 10.0000 mg | Freq: Four times a day (QID) | INTRAMUSCULAR | Status: DC | PRN

## 2023-08-13 MED ORDER — HALOPERIDOL LACTATE 5 MG/ML IJ SOLN
2.0000 mg | Freq: Once | INTRAMUSCULAR | Status: AC
  Administered 2023-08-13: 2 mg via INTRAMUSCULAR
  Filled 2023-08-13: qty 1

## 2023-08-13 MED ORDER — ONDANSETRON HCL 4 MG PO TABS: 4.0000 mg | ORAL_TABLET | Freq: Four times a day (QID) | ORAL | Status: DC | PRN

## 2023-08-13 MED ORDER — HALOPERIDOL LACTATE 5 MG/ML IJ SOLN
2.0000 mg | Freq: Four times a day (QID) | INTRAMUSCULAR | Status: DC | PRN
  Administered 2023-08-13: 2 mg via INTRAVENOUS
  Filled 2023-08-13: qty 1

## 2023-08-13 NOTE — Progress Notes (Addendum)
PROGRESS NOTE  Shannon Barrett QMV:[id number] DOB: [birth year]   PCP: Randell Patient [facility]  Patient is from: Home  DOA: 08/12/2023 LOS: 1  Chief complaints Chief Complaint  Patient presents with   Altered Mental Status     Brief Narrative / Interim history: 68 y.o. F with PMH of DM-2 and HTN brought to ED due to confusion, visual and auditory hallucination.  Reportedly with known bedbugs and bites all over.  Per POA report to triage nurse, patient was not showering or taking her medications.  Patient does confused and only oriented to self on presentation.  In ED, slightly hypertensive.  Basic labs without significant finding.  UA with large LE and rare bacteria.  UDS positive for benzo.  Does not seem to prescription for benzo.  CT head and CXR without significant finding.  Patient was given IM Geodon 10 mg x 1, IM Haldol 2 mg x 1, Benadryl 25 mg p.o. x 1 and IV ceftriaxone.  Admission requested for acute encephalopathy in the setting of presumed UTI.   Subjective: Patient was agitated overnight requiring bilateral wrist restraints.  She is sleepy this morning.  Wakes to voice but only oriented to self.  She follows command.  Denies pain.   Objective: Vitals:   08/12/23 1915 08/12/23 2349 08/13/23 0009 08/13/23 0627  BP: (!) 151/99 (!) 176/81 (!) 185/85 (!) 153/88  Pulse: 76 76 66 63  Resp:  18 17 18   Temp:  98.9 F (37.2 C) 97.9 F (36.6 C) 97.7 F (36.5 C)  TempSrc:   Oral Oral  SpO2: 96% 99% 100% 100%  Height:        Examination:  GENERAL: No apparent distress.  Nontoxic. HEENT: MMM.  Vision and hearing grossly intact.  NECK: Supple.  No apparent JVD.  RESP:  No IWOB.  Fair aeration bilaterally. CVS:  RRR. Heart sounds normal.  ABD/GI/GU: BS+. Abd soft, NTND.  MSK/EXT:  Moves extremities. No apparent deformity. No edema.  SKIN: no apparent skin lesion or wound NEURO: Sleepy but wakes to voice.  Oriented to self.  Follows commands.  No apparent focal  neuro deficit. PSYCH: Calm. Normal affect.   Procedures:  None  Microbiology summarized: Urine culture pending  Assessment and plan: Confusion/agitation/audiovisual hallucination: Suspect major cognitive impairment.  Talked to patient's daughter.  No formal diagnosis of dementia but he does not see provider much.  Daughter noted some cognitive decline over the last 1 year that has progressed quickly in the last 2 months.  She is confused without a visual hallucination, sundowning, wandering and not taking care of herself.  At times, she does not even recognize her daughter.  Does not taking medications either.  Per daughter, does not have regular PCP but goes to health department times. UDS positive for benzodiazepines.  Daughter denies prescription for benzodiazepines.  UA with pyuria but she does not have fever or leukocytosis.  No suprapubic tenderness either.  CT head without significant finding.  TSH and B12 within normal.   Patient is currently sleepy but wakes to voice.  She is oriented to self and follows command.  No focal neurodeficit.  Doubt seizure or epilepsy. -Discussed with teleneurology who recommended MRI brain with and without contrast, B12 injection, treating possible UTI and transferred to [facility] for neurology consultation/evaluation. -Reorientation and delirium precaution -Will try to take off wrist restraints -Will consult psych if become agitated or psychotic again -Continue one-to-one safety sitter for now -SLP consult for language and cognitive evaluation -  OT eval  Pyuria: Unclear if this is UTI or just pyuria.  She has no fever, leukocytosis or suprapubic tenderness.   -Continue IV ceftriaxone -Requested microbiology to add urine culture to previous sample  NIDDM-2: On metformin at home but does not take. Recent Labs  Lab 08/12/23 1844 08/13/23 0724  GLUCAP 106* 85  -Check hemoglobin A1c -SSI sensitive pending A1c  Essential hypertension: BP slightly  elevated.  Seems to be on lisinopril/HCTZ at home -Will do amlodipine continue -P.o. hydralazine as needed  Microcytic anemia: Stable.  Anemia panel with mild iron deficiency.  B12 305. Recent Labs    08/12/23 1839 08/13/23 0622  HGB 12.4 11.9*  -B12 supplementation as above -Continue monitoring   Advance care planning: Discussed CODE STATUS with patient's daughter.  Patient is full code.  Body mass index is 26.16 kg/m.           DVT prophylaxis:  enoxaparin (LOVENOX) injection 40 mg Start: 08/13/23 1000 SCDs Start: 08/13/23 0543  Code Status: Full code Family Communication: Updated patient's daughter over the phone.  Not able to get hold of patient's niece. Level of care: Med-Surg Status is: Inpatient Remains inpatient appropriate because: Agitation/psychosis/confusion   Final disposition: To be determined Consultants:  Telemetry neurology over the phone  55 minutes with more than 50% spent in reviewing records, counseling patient/family and coordinating care.   Sch Meds:  Scheduled Meds:  enoxaparin (LOVENOX) injection  40 mg Subcutaneous Q24H   insulin aspart  0-9 Units Subcutaneous TID WC   Continuous Infusions:  cefTRIAXone (ROCEPHIN)  IV 1 g (08/13/23 0831)   PRN Meds:.acetaminophen **OR** acetaminophen, hydrALAZINE, ondansetron **OR** ondansetron (ZOFRAN) IV  Antimicrobials: Anti-infectives (From admission, onward)    Start     Dose/Rate Route Frequency Ordered Stop   08/13/23 1000  cefTRIAXone (ROCEPHIN) 1 g in sodium chloride 0.9 % 100 mL IVPB        1 g 200 mL/hr over 30 Minutes Intravenous Every 24 hours 08/13/23 0530     08/12/23 2300  cefTRIAXone (ROCEPHIN) 2 g in sodium chloride 0.9 % 100 mL IVPB        2 g 200 mL/hr over 30 Minutes Intravenous  Once 08/12/23 2245 08/13/23 0018        I have personally reviewed the following labs and images: CBC: Recent Labs  Lab 08/12/23 1839 08/13/23 0622  WBC 5.8 5.0  NEUTROABS 2.7  --   HGB  12.4 11.9*  HCT 35.9* 35.7*  MCV 78.2* 78.8*  PLT 272 262   BMP &GFR Recent Labs  Lab 08/12/23 1839 08/13/23 0622  NA 138 139  K 3.9 3.6  CL 105 108  CO2 25 23  GLUCOSE 108* 103*  BUN 14 14  CREATININE 0.89 0.81  CALCIUM 9.3 9.2  MG  --  2.1  PHOS  --  3.3   CrCl cannot be calculated (Unknown ideal weight.). Liver & Pancreas: Recent Labs  Lab 08/12/23 1839 08/13/23 0622  AST 28 25  ALT 18 17  ALKPHOS 66 66  BILITOT 0.7 0.2  PROT 8.5* 7.8  ALBUMIN 3.8 3.7   No results for input(s): "LIPASE", "AMYLASE" in the last 168 hours. No results for input(s): "AMMONIA" in the last 168 hours. Diabetic: No results for input(s): "HGBA1C" in the last 72 hours. Recent Labs  Lab 08/12/23 1844 08/13/23 0724  GLUCAP 106* 85   Cardiac Enzymes: No results for input(s): "CKTOTAL", "CKMB", "CKMBINDEX", "TROPONINI" in the last 168 hours. No results for input(s): "  PROBNP" in the last 8760 hours. Coagulation Profile: No results for input(s): "INR", "PROTIME" in the last 168 hours. Thyroid Function Tests: Recent Labs    08/13/23 0622  TSH 3.854   Lipid Profile: No results for input(s): "CHOL", "HDL", "LDLCALC", "TRIG", "CHOLHDL", "LDLDIRECT" in the last 72 hours. Anemia Panel: Recent Labs    08/13/23 0622  VITAMINB12 305  FERRITIN 36  TIBC 340  IRON 41   Urine analysis:    Component Value Date/Time   COLORURINE YELLOW 08/12/2023 1752   APPEARANCEUR HAZY (A) 08/12/2023 1752   LABSPEC 1.021 08/12/2023 1752   PHURINE 6.0 08/12/2023 1752   GLUCOSEU NEGATIVE 08/12/2023 1752   HGBUR NEGATIVE 08/12/2023 1752   BILIRUBINUR NEGATIVE 08/12/2023 1752   KETONESUR NEGATIVE 08/12/2023 1752   PROTEINUR 30 (A) 08/12/2023 1752   NITRITE NEGATIVE 08/12/2023 1752   LEUKOCYTESUR LARGE (A) 08/12/2023 1752   Sepsis Labs: Invalid input(s): "PROCALCITONIN", "LACTICIDVEN"  Microbiology: No results found for this or any previous visit (from the past 240 hours).  Radiology  Studies: CT Head Wo Contrast Result Date: 08/12/2023 CLINICAL DATA:  Mental status change confusion EXAM: CT HEAD WITHOUT CONTRAST TECHNIQUE: Contiguous axial images were obtained from the base of the skull through the vertex without intravenous contrast. RADIATION DOSE REDUCTION: This exam was performed according to the departmental dose-optimization program which includes automated exposure control, adjustment of the mA and/or kV according to patient size and/or use of iterative reconstruction technique. COMPARISON:  None Available. FINDINGS: Brain: No acute territorial infarction, hemorrhage or intracranial mass. Mild atrophy. Minimal white matter hypodensity. Nonenlarged ventricles Vascular: No hyperdense vessels.  No unexpected calcification Skull: Normal. Negative for fracture or focal lesion. Sinuses/Orbits: No acute finding. Other: None IMPRESSION: No CT evidence for acute intracranial abnormality. Mild atrophy. Electronically Signed   By: Jasmine Pang M.D.   On: 08/12/2023 19:57   DG Chest 2 View Result Date: 08/12/2023 CLINICAL DATA:  Weakness and confusion. EXAM: CHEST - 2 VIEW COMPARISON:  None Available. FINDINGS: The cardiomediastinal contours are normal. Pulmonary vasculature is normal. No consolidation, pleural effusion, or pneumothorax. No acute osseous abnormalities are seen. IMPRESSION: No active cardiopulmonary disease. Electronically Signed   By: Narda Rutherford M.D.   On: 08/12/2023 18:25      Jenilee Franey T. Ewelina Naves [facility]  If 7PM-7AM, please contact night-coverage [email or web address] 08/13/2023, 9:15 AM

## 2023-08-13 NOTE — Progress Notes (Signed)
Called the patient's daughter to alert her of location change. Daughter is requesting an update from the doctors tomorrow

## 2023-08-13 NOTE — Progress Notes (Signed)
Patient arrived to the floor. She has a Recruitment consultant. She could only tell me her name but was confused to time, place and situation. She could not even tell me her birth date. She is pleasantly confused at this time. Sitter is able to redirect patient to keep patient in the room.

## 2023-08-13 NOTE — Progress Notes (Signed)
This pts restraints were removed to allow pt to walk with nurse tech through the hospital hallway for an activity. Pt tolerated the walk well, no further issues were noted, pt cont'd to deny lying in bed for sleep, soft restraints were re-applied. Pt is now sleeping quietly, will continue to monitor progress and follow restraint protocol

## 2023-08-13 NOTE — Progress Notes (Signed)
Pt is confused and restless.  Pt refused morning medication. Sitter at bedside. Pt continues to try and climb out of the bed and pull at IV site. Nursing student walked pt to the bathroom this morning. Soft restraints remain in place while in bed. MD and charge nurse made aware.

## 2023-08-13 NOTE — Progress Notes (Signed)
   08/13/23 0833  TOC Brief Assessment  Insurance and Status Reviewed  Patient has primary care physician Yes  Home environment has been reviewed From home  Prior level of function: Independent  Prior/Current Home Services No current home services  Social Drivers of Health Review SDOH reviewed no interventions necessary  Readmission risk has been reviewed Yes  Transition of care needs transition of care needs identified, TOC will continue to follow (May need Behavior Health inntervention)   Pt currently has a sitter for safety, may need behavioral health support.  TOC to follow.

## 2023-08-13 NOTE — Plan of Care (Signed)
  Problem: Health Behavior/Discharge Planning: Goal: Ability to manage health-related needs will improve Outcome: Progressing   Problem: Clinical Measurements: Goal: Ability to maintain clinical measurements within normal limits will improve Outcome: Progressing Goal: Will remain free from infection Outcome: Progressing Goal: Diagnostic test results will improve Outcome: Progressing   Problem: Activity: Goal: Risk for activity intolerance will decrease Outcome: Progressing   Problem: Skin Integrity: Goal: Risk for impaired skin integrity will decrease Outcome: Progressing   Problem: Safety: Goal: Non-violent Restraint(s) Outcome: Progressing

## 2023-08-13 NOTE — Progress Notes (Signed)
Pt arrived to the floor confused and restless. Sitter in place with pt.  Pt proceeded to continuously climb out of bed. Paged on call physician requesting Geodon 10 mg. Order rec'd for Haldol. This nurse and nurse tech engaged pt with walking hoping to stimulate tiredness for sleep. Walking pt and Haldol uneffective. Pt refused to cooperate, behavior progressively getting worse. Requested soft restraints. Restraints in place, pt at ease, finally sleeping comfortably. Rise and fall of chest visualized. Sitter in room with pt. This nurse will continue to monitor pt the remainder of nursing shift for any changes or signs of discomfort with restraints. Will also follow restraint protocol

## 2023-08-13 NOTE — Consult Note (Incomplete)
NEUROLOGY CONSULT NOTE   Date of service: August 13, 2023 Patient Name: Shannon Barrett MRN:  [id number] DOB:  [birth year] Chief Complaint: "altered mental status" Requesting Provider: Almon Hercules, MD  History of Present Illness  Shannon Barrett is a 68 y.o. female who has a past medical history of Diabetes mellitus without complication (HCC), Hypertension, and Night muscle spasms., who presents with confusion, visual, and auditory hallucinations. It is reported that she has been noncompliant with her home medications and has not been showering or taking care of herself. She has been getting lost every time she leaves the house. In the emergency department her UA showed large leukocytes with rare bacteria, he urine culture is in process.  Her UDS is also positive for benzodiazepines which do not appear in her home or hospital medications lists. Additionally she does have bedbug bites.  Overnight she did require bilateral wrist restraints. She has only been oriented to self since being in the hospital.   ROS   Unable to ascertain due to altered mental status   Past History   Past Medical History:  Diagnosis Date   Diabetes mellitus without complication (HCC)    Hypertension    Night muscle spasms     History reviewed. No pertinent surgical history.  Family History: History reviewed. No pertinent family history.  Social History  reports that she has never smoked. She has never used smokeless tobacco. She reports that she does not drink alcohol and does not use drugs.  Allergies  Allergen Reactions   Amoxicillin    Aspirin Hives   Claritin [Loratadine]    Ibuprofen    Peanut-Containing Drug Products    Penicillins    Shellfish-Derived Products    Strawberry (Diagnostic)     Medications   Current Facility-Administered Medications:    acetaminophen (TYLENOL) tablet 650 mg, 650 mg, Oral, Q6H PRN **OR** acetaminophen (TYLENOL) suppository 650 mg, 650 mg, Rectal, Q6H  PRN, Adefeso, Oladapo, DO   amLODipine (NORVASC) tablet 10 mg, 10 mg, Oral, Daily, Gonfa, Taye T, MD   cefTRIAXone (ROCEPHIN) 1 g in sodium chloride 0.9 % 100 mL IVPB, 1 g, Intravenous, Q24H, Adefeso, Oladapo, DO, Last Rate: 200 mL/hr at 08/13/23 0831, 1 g at 08/13/23 0831   cyanocobalamin (VITAMIN B12) injection 1,000 mcg, 1,000 mcg, Intramuscular, Daily **FOLLOWED BY** [START ON 08/17/2023] cyanocobalamin (VITAMIN B12) tablet 1,000 mcg, 1,000 mcg, Oral, Daily, Gonfa, Taye T, MD   enoxaparin (LOVENOX) injection 40 mg, 40 mg, Subcutaneous, Q24H, Adefeso, Oladapo, DO, 40 mg at 08/13/23 0831   haloperidol lactate (HALDOL) injection 2 mg, 2 mg, Intravenous, Q6H PRN, Alanda Slim, Taye T, MD, 2 mg at 08/13/23 1402   hydrALAZINE (APRESOLINE) injection 10 mg, 10 mg, Intravenous, Q6H PRN, Adefeso, Oladapo, DO   lisinopril (ZESTRIL) tablet 20 mg, 20 mg, Oral, Daily **AND** hydrochlorothiazide (HYDRODIURIL) tablet 12.5 mg, 12.5 mg, Oral, Daily, Murriel Hopper M, RPH   insulin aspart (novoLOG) injection 0-9 Units, 0-9 Units, Subcutaneous, TID WC, Adefeso, Oladapo, DO   [START ON 08/14/2023] OLANZapine (ZYPREXA) tablet 2.5 mg, 2.5 mg, Oral, q AM **AND** OLANZapine (ZYPREXA) tablet 5 mg, 5 mg, Oral, QHS, Gonfa, Taye T, MD   ondansetron (ZOFRAN) tablet 4 mg, 4 mg, Oral, Q6H PRN **OR** ondansetron (ZOFRAN) injection 4 mg, 4 mg, Intravenous, Q6H PRN, Adefeso, Oladapo, DO  No current facility-administered medications on file prior to encounter.   Current Outpatient Medications on File Prior to Encounter  Medication Sig Dispense Refill   albuterol (PROVENTIL HFA) 108 (90  Base) MCG/ACT inhaler Inhale 2 puffs into the lungs every 4 (four) hours as needed for wheezing or shortness of breath. (Patient not taking: Reported on 08/13/2023)     cetirizine (ZYRTEC) 10 MG tablet Take 10 mg by mouth daily. (Patient not taking: Reported on 08/13/2023)     cyclobenzaprine (FLEXERIL) 10 MG tablet Take 10 mg by mouth 2 (two) times daily  as needed for muscle spasms. (Patient not taking: Reported on 08/13/2023)     lisinopril-hydrochlorothiazide (ZESTORETIC) 20-12.5 MG tablet Take 1 tablet by mouth daily. (Patient not taking: Reported on 08/13/2023)     metFORMIN (GLUCOPHAGE-XR) 500 MG 24 hr tablet Take 500 mg by mouth in the morning and at bedtime. (Patient not taking: Reported on 08/13/2023)     naproxen (NAPROSYN) 500 MG tablet Take 500 mg by mouth 2 (two) times daily as needed. (Patient not taking: Reported on 08/13/2023)       Vitals   Vitals:   08/13/23 0009 08/13/23 0627 08/13/23 1326 08/13/23 1635  BP: (!) 185/85 (!) 153/88 (!) 182/95 (!) 169/99  Pulse: 66 63 98 96  Resp: 17 18 17 16   Temp: 97.9 F (36.6 C) 97.7 F (36.5 C)  98.2 F (36.8 C)  TempSrc: Oral Oral    SpO2: 100% 100% 100% 100%  Height:        Body mass index is 26.16 kg/m.  Physical Exam   HEENT: [place]/AT Lungs: Respirations unlabored Ext: No edema  Neurologic Examination  Mental Status: Initially sleeping on my arrival to the room. On awakening she is oriented to name, but states she is 68 years old.  When told she is at the hospital she states "no I'm not", and says that she wants to go to sleep. Intermittently follows simple commands.  Poor attention and concentration Cranial Nerves: II: Blinks to threat bilaterally. Pupils are equal, round, and reactive to light.   III,IV, VI: Tracks examiner bilaterally VII: Facial movement is symmetric resting and smiling VIII: Hearing is intact to voice X: Phonation is normal XI: Shoulder shrug is symmetric. XII: Does not follow commands Motor: Tone is normal. Bulk is normal.   Elevates bilateral lower extremities antigravity.   Moves bilateral upper extremities antigravity. Sensory: Localizes to noxious stimuli Cerebellar: Does not follow commands to complete   Labs/Imaging/Neurodiagnostic studies   CBC:  Recent Labs  Lab 18-Aug-2023 1839 08/13/23 0622  WBC 5.8 5.0  NEUTROABS 2.7  --   HGB  12.4 11.9*  HCT 35.9* 35.7*  MCV 78.2* 78.8*  PLT 272 262    Basic Metabolic Panel:  Lab Results  Component Value Date   NA 139 08/13/2023   K 3.6 08/13/2023   CO2 23 08/13/2023   GLUCOSE 103 (H) 08/13/2023   BUN 14 08/13/2023   CREATININE 0.81 08/13/2023   CALCIUM 9.2 08/13/2023   GFRNONAA >60 08/13/2023    Lab Results  Component Value Date   VITAMINB12 305 08/13/2023   TSH 3.854 08/13/2023     Urine Drug Screen:     Component Value Date/Time   LABOPIA NONE DETECTED 08/18/2023 1752   COCAINSCRNUR NONE DETECTED 18-Aug-2023 1752   LABBENZ POSITIVE (A) 18-Aug-2023 1752   AMPHETMU NONE DETECTED 08/18/23 1752   THCU NONE DETECTED 18-Aug-2023 1752   LABBARB NONE DETECTED 08/18/23 1752     Alcohol Level     Component Value Date/Time   ETH <10 08/18/23 1839   CT Head without contrast (Personally reviewed): No CT evidence for acute intracranial abnormality. Mild atrophy.  ASSESSMENT  68 year old female with a PMHx of DM, HTN and night muscle spasms who presents from home for assessment of rapidly progressing cognitive decline.   - Exam is limited by participation. She is disoriented, somnolent and confused, but with no focal motor or sensory deficits on exam. She is currently requiring bilateral wrist restraints in and a bedside sitter.   - CT head: No evidence for acute intracranial abnormality. Mild atrophy - Lab work is still pending as is an MRI brain with and without contrast.   - She has required multiple doses of Geodon and Haldol to help with her agitation. Zyprexa 2.5 mg before meals and 5 mg at bedtime to start tomorrow - DDx for presentation includes a rapidly progressive dementia, toxic/metabolic encephalopathy, and subclinical seizures  RECOMMENDATIONS  - MRI brain w/wo contrast - Dementia panel - B12, B1, TSH, RPR, HIV, CRP, ESR, Folate - B12 is 305-supplement - Continue UTI treatment - EEG  (ordered) ______________________________________________________________________   Patient seen and examined by NP/APP with MD.  Elmer Picker, DNP, FNP-BC [facility] Pager: [phone]  I have seen and examined the patient. I have reviewed the assessment and recommendations and made amendations as needed. 68 year old female presenting with rapidly progressive cognitive decline. Initial NP and follow up attending exams are limited by participation and waxing/waning mental status, but no focal neurological deficits are appreciated. She is currently requiring bilateral wrist restraints and a bedside sitter. DDx for underlying etiology is broad. Recommendations as above.  Electronically signed: Dr. Caryl Pina

## 2023-08-13 NOTE — Progress Notes (Signed)
Pt is trying climb out the bed, confused, and talking to herself. Haldol given. Md made aware. Safety sitter at bedside and soft restraints in place.

## 2023-08-13 NOTE — Plan of Care (Signed)
  Problem: Health Behavior/Discharge Planning: Goal: Ability to manage health-related needs will improve Outcome: Progressing   Problem: Clinical Measurements: Goal: Ability to maintain clinical measurements within normal limits will improve Outcome: Progressing Goal: Will remain free from infection Outcome: Progressing Goal: Diagnostic test results will improve Outcome: Progressing   Problem: Activity: Goal: Risk for activity intolerance will decrease Outcome: Progressing   Problem: Skin Integrity: Goal: Risk for impaired skin integrity will decrease Outcome: Progressing   Problem: Safety: Goal: Non-violent Restraint(s) Outcome: Progressing   Problem: Education: Goal: Ability to describe self-care measures that may prevent or decrease complications (Diabetes Survival Skills Education) will improve Outcome: Progressing Goal: Individualized Educational Video(s) Outcome: Progressing   Problem: Coping: Goal: Ability to adjust to condition or change in health will improve Outcome: Progressing   Problem: Fluid Volume: Goal: Ability to maintain a balanced intake and output will improve Outcome: Progressing   Problem: Health Behavior/Discharge Planning: Goal: Ability to identify and utilize available resources and services will improve Outcome: Progressing Goal: Ability to manage health-related needs will improve Outcome: Progressing   Problem: Metabolic: Goal: Ability to maintain appropriate glucose levels will improve Outcome: Progressing   Problem: Nutritional: Goal: Maintenance of adequate nutrition will improve Outcome: Progressing Goal: Progress toward achieving an optimal weight will improve Outcome: Progressing   Problem: Skin Integrity: Goal: Risk for impaired skin integrity will decrease Outcome: Progressing   Problem: Tissue Perfusion: Goal: Adequacy of tissue perfusion will improve Outcome: Progressing

## 2023-08-14 ENCOUNTER — Inpatient Hospital Stay (HOSPITAL_COMMUNITY): Payer: Medicare Other

## 2023-08-14 DIAGNOSIS — R4189 Other symptoms and signs involving cognitive functions and awareness: Secondary | ICD-10-CM

## 2023-08-14 DIAGNOSIS — D509 Iron deficiency anemia, unspecified: Secondary | ICD-10-CM | POA: Diagnosis not present

## 2023-08-14 DIAGNOSIS — G9341 Metabolic encephalopathy: Secondary | ICD-10-CM | POA: Diagnosis not present

## 2023-08-14 DIAGNOSIS — R410 Disorientation, unspecified: Secondary | ICD-10-CM | POA: Diagnosis not present

## 2023-08-14 DIAGNOSIS — R569 Unspecified convulsions: Secondary | ICD-10-CM | POA: Diagnosis not present

## 2023-08-14 DIAGNOSIS — N3000 Acute cystitis without hematuria: Secondary | ICD-10-CM | POA: Diagnosis not present

## 2023-08-14 DIAGNOSIS — I10 Essential (primary) hypertension: Secondary | ICD-10-CM | POA: Diagnosis not present

## 2023-08-14 LAB — CBC
HCT: 37.7 % (ref 36.0–46.0)
Hemoglobin: 13.3 g/dL (ref 12.0–15.0)
MCH: 26.8 pg (ref 26.0–34.0)
MCHC: 35.3 g/dL (ref 30.0–36.0)
MCV: 76 fL — ABNORMAL LOW (ref 80.0–100.0)
Platelets: 298 10*3/uL (ref 150–400)
RBC: 4.96 MIL/uL (ref 3.87–5.11)
RDW: 14.2 % (ref 11.5–15.5)
WBC: 5.9 10*3/uL (ref 4.0–10.5)
nRBC: 0 % (ref 0.0–0.2)

## 2023-08-14 LAB — VITAMIN D 25 HYDROXY (VIT D DEFICIENCY, FRACTURES): Vit D, 25-Hydroxy: 22.97 ng/mL — ABNORMAL LOW (ref 30–100)

## 2023-08-14 LAB — RENAL FUNCTION PANEL
Albumin: 3.5 g/dL (ref 3.5–5.0)
Anion gap: 10 (ref 5–15)
BUN: 12 mg/dL (ref 8–23)
CO2: 23 mmol/L (ref 22–32)
Calcium: 9.3 mg/dL (ref 8.9–10.3)
Chloride: 105 mmol/L (ref 98–111)
Creatinine, Ser: 1.11 mg/dL — ABNORMAL HIGH (ref 0.44–1.00)
GFR, Estimated: 55 mL/min — ABNORMAL LOW (ref >60)
Glucose, Bld: 105 mg/dL — ABNORMAL HIGH (ref 70–99)
Phosphorus: 4.1 mg/dL (ref 2.5–4.6)
Potassium: 4 mmol/L (ref 3.5–5.1)
Sodium: 138 mmol/L (ref 135–145)

## 2023-08-14 LAB — FOLATE: Folate: 15.4 ng/mL (ref >5.9)

## 2023-08-14 LAB — GLUCOSE, CAPILLARY
Glucose-Capillary: 100 mg/dL — ABNORMAL HIGH (ref 70–99)
Glucose-Capillary: 95 mg/dL (ref 70–99)
Glucose-Capillary: 106 mg/dL — ABNORMAL HIGH (ref 70–99)
Glucose-Capillary: 128 mg/dL — ABNORMAL HIGH (ref 70–99)

## 2023-08-14 LAB — MAGNESIUM: Magnesium: 2 mg/dL (ref 1.7–2.4)

## 2023-08-14 LAB — RPR: RPR Ser Ql: NONREACTIVE

## 2023-08-14 MED ORDER — LORAZEPAM 2 MG/ML IJ SOLN
2.0000 mg | Freq: Once | INTRAMUSCULAR | Status: AC | PRN
  Administered 2023-08-14: 2 mg via INTRAVENOUS
  Filled 2023-08-14: qty 1

## 2023-08-14 MED ORDER — ENOXAPARIN SODIUM 40 MG/0.4ML IJ SOSY
40.0000 mg | PREFILLED_SYRINGE | INTRAMUSCULAR | Status: DC
  Administered 2023-08-15: 40 mg via SUBCUTANEOUS
  Filled 2023-08-14: qty 0.4

## 2023-08-14 NOTE — Plan of Care (Signed)
?  Problem: Clinical Measurements: ?Goal: Ability to maintain clinical measurements within normal limits will improve ?Outcome: Progressing ?Goal: Will remain free from infection ?Outcome: Progressing ?Goal: Diagnostic test results will improve ?Outcome: Progressing ?  ?

## 2023-08-14 NOTE — Progress Notes (Signed)
EEG complete - results pending

## 2023-08-14 NOTE — Plan of Care (Signed)
Problem: Health Behavior/Discharge Planning: Goal: Ability to manage health-related needs will improve Outcome: Progressing   Problem: Clinical Measurements: Goal: Ability to maintain clinical measurements within normal limits will improve Outcome: Progressing Goal: Diagnostic test results will improve Outcome: Progressing

## 2023-08-14 NOTE — Procedures (Signed)
Patient Name: Shannon Barrett  MRN: [id number]  Epilepsy Attending: Charlsie Quest  Referring Physician/Provider: Caryl Pina, MD  Date: 08/14/2023 Duration: 22.45 mins  Patient history: 68 year old female with a PMHx of DM, HTN and night muscle spasms who presents from home for assessment of rapidly progressing cognitive decline. EEG to evaluate for seizure  Level of alertness: Awake, asleep  AEDs during EEG study: None  Technical aspects: This EEG study was done with scalp electrodes positioned according to the 10-20 International system of electrode placement. Electrical activity was reviewed with band pass filter of 1-70Hz , sensitivity of 7 uV/mm, display speed of 62mm/sec with a 60Hz  notched filter applied as appropriate. EEG data were recorded continuously and digitally stored.  Video monitoring was available and reviewed as appropriate.  Description: The posterior dominant rhythm consists of 8 Hz activity of moderate voltage (25-35 uV) seen predominantly in posterior head regions, symmetric and reactive to eye opening and eye closing. Sleep was characterized by vertex waves, sleep spindles (12 to 14 Hz), maximal frontocentral region.  EEG showed intermittent generalized 3 to 6 Hz theta-delta slowing. Hyperventilation and photic stimulation were not performed.     ABNORMALITY - Intermittent slow, generalized  IMPRESSION: This study is suggestive of mild diffuse encephalopathy. No seizures or epileptiform discharges were seen throughout the recording.  Gabrella Stroh Annabelle Harman

## 2023-08-14 NOTE — Progress Notes (Signed)
PROGRESS NOTE  Shannon Barrett:[id number] DOB: 11/09/1955   PCP: Randell Patient [facility]  Patient is from: Home  DOA: 08/12/2023 LOS: 2  Chief complaints Chief Complaint  Patient presents with   Altered Mental Status     Brief Narrative / Interim history: 68 y.o. F with PMH of DM-2 and HTN brought to ED due to confusion, visual and auditory hallucination.  Reportedly with known bedbugs and bites all over.  Per POA report to triage nurse, patient was not showering or taking her medications.  Patient does confused and only oriented to self on presentation.  In ED, slightly hypertensive.  Basic labs without significant finding.  UA with large LE and rare bacteria.  UDS positive for benzo.  Does not seem to prescription for benzo.  CT head and CXR without significant finding.  Patient was given IM Geodon 10 mg x 1, IM Haldol 2 mg x 1, Benadryl 25 mg p.o. x 1 and IV ceftriaxone.  Admission requested for acute encephalopathy in the setting of presumed UTI.   Patient continued to be confused and agitated at times.  Per discussion with patient's daughter, rapid cognitive decline over the last 2 months.  Neurology consulted and recommended MRI brain with and transferred to [facility] for further workup.  Subjective: Seen and examined earlier this morning.  No major events overnight of this morning.  Sleepy for most part.  Per Recruitment consultant, she woke up this morning and went to the bathroom.  After she returned, she tried to walk out of the room but redirectable.  She has been sleepy since then.  Has not eaten breakfast yet.  Patient wakes to voice.  She is oriented only to self and follows command.   Objective: Vitals:   08/14/23 0049 08/14/23 0054 08/14/23 0450 08/14/23 0838  BP: (!) 162/125 (!) 142/72 (!) 160/92 (!) 144/76  Pulse: 83 79 86 (!) 103  Resp: 18  18 17   Temp: (!) 97.5 F (36.4 C)  97.8 F (36.6 C) 98.2 F (36.8 C)  TempSrc: Oral  Rectal Axillary  SpO2: 100%  98%  100%  Weight:      Height:        Examination:  GENERAL: No apparent distress.  Nontoxic. HEENT: MMM.  Vision and hearing grossly intact.  NECK: Supple.  No apparent JVD.  RESP:  No IWOB.  Fair aeration bilaterally. CVS:  RRR. Heart sounds normal.  ABD/GI/GU: BS+. Abd soft, NTND.  MSK/EXT:  Moves extremities. No apparent deformity. No edema.  SKIN: no apparent skin lesion or wound NEURO: Sleepy but wakes to voice.  Oriented to self.  Follows commands.  No apparent focal neuro deficit. PSYCH: Calm. Normal affect.   Procedures:  None  Microbiology summarized: Urine culture pending  Assessment and plan: Confusion/agitation/audiovisual hallucination: Suspect major cognitive impairment.  Per daughter, no formal diagnosis of dementia but he does not see provider much.  Daughter noted some cognitive decline over the last 1 year that has progressed quickly in the last 2 months.  She is confused without a visual hallucination, sundowning, wandering and self-neglect.  At times, she does not even recognize her daughter.  He does not take her regular meds.  Per daughter, does not have regular PCP but goes to health department times. UDS positive for benzodiazepines without prescription.  UA with pyuria but she does not have fever or leukocytosis.  No suprapubic tenderness either.  CT head without significant finding.  RPR, CRP, HIV, TSH, folate and  B12 within normal.  EEG with mild diffuse encephalopathy but no seizure or epileptiform discharge.   Patient is currently sleepy but wakes to voice.  She is oriented to self and follows command.  No focal neurodeficit.  -Appreciate help by neurology-MRI brain with/without contrast, dementia panel, B12 injection and UTI treatment -Appreciate psychiatry input-not able to assess due to somnolence. -Continue Zyprexa 2.5 mg in the morning and 5 mg in the evening -IV Haldol as needed -QTc 426. -Continue one-to-one safety sitter and bilateral wrist  restraints. -Reorientation and delirium precaution  Pyuria: Unclear if this is UTI or just pyuria.  She has no fever, leukocytosis or suprapubic tenderness.   -Continue IV ceftriaxone pending urine culture  NIDDM-2: On metformin at home but does not take. Recent Labs  Lab 08/13/23 1123 08/13/23 1645 08/13/23 2110 08/14/23 0709 08/14/23 1124  GLUCAP 88 88 117* 100* 95  -Check hemoglobin A1c -SSI sensitive pending A1c  Essential hypertension: BP slightly elevated.  Seems to be on lisinopril/HCTZ at home -Continue amlodipine -P.o. hydralazine as needed  Microcytic anemia: Stable.  Anemia panel with mild iron deficiency.  B12 305. Recent Labs    08/12/23 1839 08/13/23 0622 08/14/23 0610  HGB 12.4 11.9* 13.3  -B12 supplementation as above -Continue monitoring   Advance care planning: Discussed CODE STATUS with patient's daughter.  Patient is full code.  Body mass index is 26.17 kg/m.           DVT prophylaxis:  enoxaparin (LOVENOX) injection 40 mg Start: 08/13/23 1000 SCDs Start: 08/13/23 0543  Code Status: Full code Family Communication: Updated patient's daughter over the phone. Level of care: Med-Surg Status is: Inpatient Remains inpatient appropriate because: Agitation/psychosis/confusion   Final disposition: To be determined Consultants:  Neurology Psychiatry  55 minutes with more than 50% spent in reviewing records, counseling patient/family and coordinating care.   Sch Meds:  Scheduled Meds:  amLODipine  10 mg Oral Daily   cyanocobalamin  1,000 mcg Intramuscular Daily   Followed by   Melene Muller ON 08/17/2023] vitamin B-12  1,000 mcg Oral Daily   enoxaparin (LOVENOX) injection  40 mg Subcutaneous Q24H   lisinopril  20 mg Oral Daily   And   hydrochlorothiazide  12.5 mg Oral Daily   insulin aspart  0-9 Units Subcutaneous TID WC   OLANZapine  2.5 mg Oral q AM   And   OLANZapine  5 mg Oral QHS   Continuous Infusions:  cefTRIAXone (ROCEPHIN)  IV 1  g (08/14/23 1113)   PRN Meds:.acetaminophen **OR** acetaminophen, haloperidol lactate, hydrALAZINE, ondansetron **OR** ondansetron (ZOFRAN) IV  Antimicrobials: Anti-infectives (From admission, onward)    Start     Dose/Rate Route Frequency Ordered Stop   08/13/23 1000  cefTRIAXone (ROCEPHIN) 1 g in sodium chloride 0.9 % 100 mL IVPB        1 g 200 mL/hr over 30 Minutes Intravenous Every 24 hours 08/13/23 0530     08/12/23 2300  cefTRIAXone (ROCEPHIN) 2 g in sodium chloride 0.9 % 100 mL IVPB        2 g 200 mL/hr over 30 Minutes Intravenous  Once 08/12/23 2245 08/13/23 0018        I have personally reviewed the following labs and images: CBC: Recent Labs  Lab 08/12/23 1839 08/13/23 0622 08/14/23 0610  WBC 5.8 5.0 5.9  NEUTROABS 2.7  --   --   HGB 12.4 11.9* 13.3  HCT 35.9* 35.7* 37.7  MCV 78.2* 78.8* 76.0*  PLT 272 262 298  BMP &GFR Recent Labs  Lab 08/12/23 1839 08/13/23 0622 08/14/23 0610  NA 138 139 138  K 3.9 3.6 4.0  CL 105 108 105  CO2 25 23 23   GLUCOSE 108* 103* 105*  BUN 14 14 12   CREATININE 0.89 0.81 1.11*  CALCIUM 9.3 9.2 9.3  MG  --  2.1 2.0  PHOS  --  3.3 4.1   Estimated Creatinine Clearance: 44.1 mL/min (A) (by C-G formula based on SCr of 1.11 mg/dL (H)). Liver & Pancreas: Recent Labs  Lab 08/12/23 1839 08/13/23 0622 08/14/23 0610  AST 28 25  --   ALT 18 17  --   ALKPHOS 66 66  --   BILITOT 0.7 0.2  --   PROT 8.5* 7.8  --   ALBUMIN 3.8 3.7 3.5   No results for input(s): "LIPASE", "AMYLASE" in the last 168 hours. No results for input(s): "AMMONIA" in the last 168 hours. Diabetic: No results for input(s): "HGBA1C" in the last 72 hours. Recent Labs  Lab 08/13/23 1123 08/13/23 1645 08/13/23 2110 08/14/23 0709 08/14/23 1124  GLUCAP 88 88 117* 100* 95   Cardiac Enzymes: No results for input(s): "CKTOTAL", "CKMB", "CKMBINDEX", "TROPONINI" in the last 168 hours. No results for input(s): "PROBNP" in the last 8760 hours. Coagulation  Profile: No results for input(s): "INR", "PROTIME" in the last 168 hours. Thyroid Function Tests: Recent Labs    08/13/23 0622  TSH 3.854   Lipid Profile: No results for input(s): "CHOL", "HDL", "LDLCALC", "TRIG", "CHOLHDL", "LDLDIRECT" in the last 72 hours. Anemia Panel: Recent Labs    08/13/23 0622 08/13/23 1911  VITAMINB12 305  --   FOLATE  --  15.8  FERRITIN 36  --   TIBC 340  --   IRON 41  --    Urine analysis:    Component Value Date/Time   COLORURINE YELLOW 08/12/2023 1752   APPEARANCEUR HAZY (A) 08/12/2023 1752   LABSPEC 1.021 08/12/2023 1752   PHURINE 6.0 08/12/2023 1752   GLUCOSEU NEGATIVE 08/12/2023 1752   HGBUR NEGATIVE 08/12/2023 1752   BILIRUBINUR NEGATIVE 08/12/2023 1752   KETONESUR NEGATIVE 08/12/2023 1752   PROTEINUR 30 (A) 08/12/2023 1752   NITRITE NEGATIVE 08/12/2023 1752   LEUKOCYTESUR LARGE (A) 08/12/2023 1752   Sepsis Labs: Invalid input(s): "PROCALCITONIN", "LACTICIDVEN"  Microbiology: No results found for this or any previous visit (from the past 240 hours).  Radiology Studies: EEG adult Result Date: 08/14/2023 Charlsie Quest, MD     08/14/2023  9:36 AM Patient Name: Shannon Barrett MRN: [id number] Epilepsy Attending: Charlsie Quest Referring Physician/Provider: Caryl Pina, MD Date: 08/14/2023 Duration: 22.45 mins Patient history: 68 year old female with a PMHx of DM, HTN and night muscle spasms who presents from home for assessment of rapidly progressing cognitive decline. EEG to evaluate for seizure Level of alertness: Awake, asleep AEDs during EEG study: None Technical aspects: This EEG study was done with scalp electrodes positioned according to the 10-20 International system of electrode placement. Electrical activity was reviewed with band pass filter of 1-70Hz , sensitivity of 7 uV/mm, display speed of 8mm/sec with a 60Hz  notched filter applied as appropriate. EEG data were recorded continuously and digitally stored.  Video monitoring  was available and reviewed as appropriate. Description: The posterior dominant rhythm consists of 8 Hz activity of moderate voltage (25-35 uV) seen predominantly in posterior head regions, symmetric and reactive to eye opening and eye closing. Sleep was characterized by vertex waves, sleep spindles (12 to 14 Hz), maximal  frontocentral region.  EEG showed intermittent generalized 3 to 6 Hz theta-delta slowing. Hyperventilation and photic stimulation were not performed.   ABNORMALITY - Intermittent slow, generalized IMPRESSION: This study is suggestive of mild diffuse encephalopathy. No seizures or epileptiform discharges were seen throughout the recording. Priyanka Annabelle Harman      Demarri Elie T. Vernita Tague [facility]  If 7PM-7AM, please contact night-coverage [email or web address] 08/14/2023, 11:54 AM

## 2023-08-14 NOTE — Consult Note (Addendum)
Patient is 68 y.o. female with medical history significant for hypertension, T2DM who presents to the emergency department due to confusion, visual and auditory hallucinations. Psychiatry was consulted for evaluation of "psychosis with agitation." This writer went to the patient's room and found patient sleeping. Attempts to wake patient up was unsuccessful as she is fast asleep. Collateral information from the 1:1 sitter at bedside indicates patient as been sleeping all day and only gets up to use the bathroom. He reports patient remains confused, disoriented and has not been able to participate in conversation. Psychiatric evaluation would be defer at this time. Complete psychiatric evaluation to be done when patient is fully awake and alert.   Dairl Ponder, MD  Psychiatrist.

## 2023-08-14 NOTE — Progress Notes (Signed)
OT Cancellation Note  Patient Details Name: Shannon Barrett MRN: [id number] DOB: July 15, 1956   Cancelled Treatment:    Reason Eval/Treat Not Completed: Patient at procedure or test/ unavailable pt at MRI. OT will return as time allows and pt is appropriate.   Providence Crosby 08/14/2023, 1:43 PM

## 2023-08-14 NOTE — Progress Notes (Signed)
NEUROLOGY CONSULT FOLLOW UP NOTE   Date of service: August 14, 2023 Patient Name: Shannon Barrett MRN:  [id number] DOB:  11/17/1955  Interval Hx/subjective  - Per bedside RN, patient has had some improved agitation since yesterday. Bedside sitter present in patient's room.  - Bedside RN reports that patient was mistakenly given Ativan at outside hospital that had been ordered for MRI tolerance.  Patient was sedated for approximately 3 hours following Ativan administration though did not undergo MRI imaging.  Plan to repeat MRI brain today.  - UDS + for benzos though no prescription for benzodiazepines present on patient's home medication list and urine was obtained prior to any hospital administration of benzodiazepine medications.    - Further history obtained from patient's niece on the telephone.  Patient's first noted confusion was in September at her mother's funeral.  The patient initially said that her mother looked so beautiful at her viewing and then while sitting during the ceremony, the patient asked her family whose funeral they were at.  She continued to have some episodes of confusion but family reports that she was redirectable.  The patient was described as an anxious person with slightly obsessive cleaning tendencies prior to the last few months with some suspected underlying psychiatric issues related to her anxiety and obsessive tendencies but was never diagnosed with any underlying psychiatric disorders.  The patient's daughter manages her finances but the patient had been living independently in an apartment able to take care of her own ADLs.  Over the past 2 months, the patient became less active, unmotivated to get out of bed, stopped caring for herself or her home with a bedbug infestation and stopped bathing herself which is extremely out of character for the patient as she is typically obsessively clean.  Family notes that the patient's hair was matted and her home looked as  if she was beginning to hoard items.  The patient began to talk to herself but it was unclear if she was hallucinating as she was initially able to be redirected.   The patient recently has become combative at night as well around 6-8 PM.  The patient also has had increased confusion talking about visiting her family members that have passed away including her mother and sisters and also has rambled about snakes at a store as children and then reported that she had snakes under her refrigerator.  These conversations have become more sporadic and the patient has become less redirectable over the past 1 to 2 months.  The family became increasingly concerned when the apartment manager notified family that the patient was walking outside without shoes or appropriate clothing and below freezing temperatures as she is wandering off.  The patient was noted to have stuffed animals and her dog that are treated as her "babies" that she commonly talks to.  Vitals   Vitals:   08/14/23 0049 08/14/23 0054 08/14/23 0450 08/14/23 0838  BP: (!) 162/125 (!) 142/72 (!) 160/92 (!) 144/76  Pulse: 83 79 86 (!) 103  Resp: 18  18 17   Temp: (!) 97.5 F (36.4 C)  97.8 F (36.6 C) 98.2 F (36.8 C)  TempSrc: Oral  Rectal Axillary  SpO2: 100%  98% 100%  Weight:      Height:        Body mass index is 26.17 kg/m.  Physical Exam   Constitutional: Thin appearing African American female laying in hospital bed, in no acute distress Psych: Patient appears pleasantly confused but is  calm throughout assessment without agitation.  Eyes: No scleral injection.  HENT: No OP obstrucion.  Head: Normocephalic.  Cardiovascular: Normal rate and regular rhythm on bedside cardiac monitor.  Respiratory: Effort normal, non-labored breathing on room air.  GI: Soft.  No distension. There is no tenderness.   Neurologic Examination  Mental Status: Patient is awake, alert, laying in hospital bed following PIV placement on neurology  evaluation.  She is able to state her name initially only giving her last name.  On clarification, she is able to state her full name.  Every further orientation question is answered and perseveration of her full name.  She is unable to state her age, month, location, or the current year.   Patient intermittently follows simple commands.  Poor attention and concentration. Nonfluent speech.  She does not objects or repeat phrases. Cranial Nerves: II:  PERRL. Does not participate in formal visual testing.  III,IV, VI: Control and instrumentation engineer throughout visual fields.  V: Reacts to light touch on bilateral face VII: Face is symmetric resting and with movement VIII: Hearing is intact to voice X: Does not allow for visualization of soft palate XI: Head moves equally to the left and right. XII: Tongue protrudes midline with minimal tongue protrusion to command Motor: Tone is normal. Bulk is normal.  Moves extremity spontaneously and with antigravity movement.  She is able to elevate each extremity without vertical drift with constant coaching.  There is no asymmetry or unilateral weakness noted. Sensory: Reacts to light touch throughout. Plantars: Toes are downgoing bilaterally.  Cerebellar: Patient does not perform  Medications  Current Facility-Administered Medications:    acetaminophen (TYLENOL) tablet 650 mg, 650 mg, Oral, Q6H PRN **OR** acetaminophen (TYLENOL) suppository 650 mg, 650 mg, Rectal, Q6H PRN, Adefeso, Oladapo, DO   amLODipine (NORVASC) tablet 10 mg, 10 mg, Oral, Daily, Alanda Slim, Taye T, MD, 10 mg at 08/14/23 1114   cefTRIAXone (ROCEPHIN) 1 g in sodium chloride 0.9 % 100 mL IVPB, 1 g, Intravenous, Q24H, Adefeso, Oladapo, DO, Last Rate: 200 mL/hr at 08/14/23 1113, 1 g at 08/14/23 1113   cyanocobalamin (VITAMIN B12) injection 1,000 mcg, 1,000 mcg, Intramuscular, Daily, 1,000 mcg at 08/14/23 1122 **FOLLOWED BY** [START ON 08/17/2023] cyanocobalamin (VITAMIN B12) tablet 1,000 mcg, 1,000 mcg,  Oral, Daily, Gonfa, Taye T, MD   enoxaparin (LOVENOX) injection 40 mg, 40 mg, Subcutaneous, Q24H, Adefeso, Oladapo, DO, 40 mg at 08/14/23 1122   haloperidol lactate (HALDOL) injection 2 mg, 2 mg, Intravenous, Q6H PRN, Alanda Slim, Taye T, MD, 2 mg at 08/13/23 1402   hydrALAZINE (APRESOLINE) injection 10 mg, 10 mg, Intravenous, Q6H PRN, Adefeso, Oladapo, DO   lisinopril (ZESTRIL) tablet 20 mg, 20 mg, Oral, Daily, 20 mg at 08/14/23 1114 **AND** hydrochlorothiazide (HYDRODIURIL) tablet 12.5 mg, 12.5 mg, Oral, Daily, Orson Aloe, RPH, 12.5 mg at 08/14/23 1114   insulin aspart (novoLOG) injection 0-9 Units, 0-9 Units, Subcutaneous, TID WC, Adefeso, Oladapo, DO   OLANZapine (ZYPREXA) tablet 2.5 mg, 2.5 mg, Oral, q AM, 2.5 mg at 08/14/23 0510 **AND** OLANZapine (ZYPREXA) tablet 5 mg, 5 mg, Oral, QHS, Gonfa, Taye T, MD   ondansetron (ZOFRAN) tablet 4 mg, 4 mg, Oral, Q6H PRN **OR** ondansetron (ZOFRAN) injection 4 mg, 4 mg, Intravenous, Q6H PRN, Adefeso, Oladapo, DO  Labs and Diagnostic Imaging   CBC:  Recent Labs  Lab 08/12/23 1839 08/13/23 0622 08/14/23 0610  WBC 5.8 5.0 5.9  NEUTROABS 2.7  --   --   HGB 12.4 11.9* 13.3  HCT 35.9* 35.7* 37.7  MCV 78.2* 78.8* 76.0*  PLT 272 262 298   Basic Metabolic Panel:  Lab Results  Component Value Date   NA 138 08/14/2023   K 4.0 08/14/2023   CO2 23 08/14/2023   GLUCOSE 105 (H) 08/14/2023   BUN 12 08/14/2023   CREATININE 1.11 (H) 08/14/2023   CALCIUM 9.3 08/14/2023   GFRNONAA 55 (L) 08/14/2023   Lipid Panel: No results found for: "LDLCALC" HgbA1c: No results found for: "HGBA1C" Urine Drug Screen:     Component Value Date/Time   LABOPIA NONE DETECTED 08/12/2023 1752   COCAINSCRNUR NONE DETECTED 08/12/2023 1752   LABBENZ POSITIVE (A) 08/12/2023 1752   AMPHETMU NONE DETECTED 08/12/2023 1752   THCU NONE DETECTED 08/12/2023 1752   LABBARB NONE DETECTED 08/12/2023 1752    Alcohol Level     Component Value Date/Time   [facility] <10 08/12/2023  1839   Lab Results  Component Value Date   VITAMINB12 305 08/13/2023   Last vitamin D Lab Results  Component Value Date   VD25OH 22.97 (L) 08/14/2023  Folate 15.4 Lab Results  Component Value Date   ESRSEDRATE 37 (H) 08/13/2023   Lab Results  Component Value Date   TSH 3.854 08/13/2023   Lab Results  Component Value Date   CRP 0.7 08/13/2023  RPR Non reactive  CT Head without contrast 08/12/23 (Personally reviewed): No CT evidence for acute intracranial abnormality. Mild atrophy.   MRI Brain ordered, pending  rEEG:  "This study is suggestive of mild diffuse encephalopathy. No seizures or epileptiform discharges were seen throughout the recording. "  Assessment   Shannon Barrett is a 68 y.o. female with a PMHx of HTN and DM2 presenting to the ED 08/12/23 for evaluation of confusion, visual and auditory hallucinations with progressive functional and cognitive decline since September of 2024 initially with redirectable confusion progressing to hallucinations, self-neglect, sundowning, and patient wondering off over the last 1-2 months.  Initial workup revealed patient with multiple bedbug bites and urine with findings consistent with a pyuria versus UTI and she was started on antibiotics pending a urine culture.  Patient was transferred to [facility] for further neurology evaluation of her cognitive decline.  Given the rapid cognitive decline, there is concern for an autoimmune encephalitis process therefore will complete EEG, MRI, lumbar puncture for CSF diagnostics.  Recommendations  - MRI brain with and without contrast attempted, only diffusion sequences obtained which were reassuring   -Will need to reattempt to with more sedation - LP with CSF autoimmune encephalitis panel, cell count with differential, glucose, protein, gram stain, and culture  -Consent in chart but could not be completed today due to Lovenox given at 11 a.m.; Lovenox retimed for the evening to facilitate  workup - [facility] Autoimmune Encephalopathy Panel (serum) ordered) - CT chest/abdomen/pelvis for occult malignancy screening - Continue evaluation and treatment of infectious process - Follow up on labs: B6, thiamine, Vitamin A - Delirium precautions  - Neurology will continue to follow ______________________________________________________________________  Signed, Kara Mead, NP [facility]  Attending Neurologist's note:  I personally saw this patient, gathering history, performing a full neurologic examination, reviewing relevant labs, personally reviewing relevant imaging including limited MRI brain, and formulated the assessment and plan, adding the note above for completeness and clarity to accurately reflect my thoughts  Brooke Dare MD-PhD [facility] [phone] Available 7 AM to 7 PM, outside these hours please contact Neurologist on call listed on AMION

## 2023-08-15 DIAGNOSIS — G9341 Metabolic encephalopathy: Secondary | ICD-10-CM | POA: Diagnosis not present

## 2023-08-15 DIAGNOSIS — D509 Iron deficiency anemia, unspecified: Secondary | ICD-10-CM | POA: Diagnosis not present

## 2023-08-15 DIAGNOSIS — F0390 Unspecified dementia without behavioral disturbance: Secondary | ICD-10-CM

## 2023-08-15 DIAGNOSIS — I10 Essential (primary) hypertension: Secondary | ICD-10-CM | POA: Diagnosis not present

## 2023-08-15 DIAGNOSIS — N3000 Acute cystitis without hematuria: Secondary | ICD-10-CM | POA: Diagnosis not present

## 2023-08-15 LAB — RENAL FUNCTION PANEL
Albumin: 3.5 g/dL (ref 3.5–5.0)
Anion gap: 11 (ref 5–15)
BUN: 27 mg/dL — ABNORMAL HIGH (ref 8–23)
CO2: 23 mmol/L (ref 22–32)
Calcium: 9.5 mg/dL (ref 8.9–10.3)
Chloride: 103 mmol/L (ref 98–111)
Creatinine, Ser: 1.36 mg/dL — ABNORMAL HIGH (ref 0.44–1.00)
GFR, Estimated: 43 mL/min — ABNORMAL LOW (ref >60)
Glucose, Bld: 121 mg/dL — ABNORMAL HIGH (ref 70–99)
Phosphorus: 4.1 mg/dL (ref 2.5–4.6)
Potassium: 3.6 mmol/L (ref 3.5–5.1)
Sodium: 137 mmol/L (ref 135–145)

## 2023-08-15 LAB — GLUCOSE, CAPILLARY
Glucose-Capillary: 114 mg/dL — ABNORMAL HIGH (ref 70–99)
Glucose-Capillary: 92 mg/dL (ref 70–99)
Glucose-Capillary: 124 mg/dL — ABNORMAL HIGH (ref 70–99)

## 2023-08-15 LAB — CBC
HCT: 38.2 % (ref 36.0–46.0)
Hemoglobin: 13.3 g/dL (ref 12.0–15.0)
MCH: 26.7 pg (ref 26.0–34.0)
MCHC: 34.8 g/dL (ref 30.0–36.0)
MCV: 76.6 fL — ABNORMAL LOW (ref 80.0–100.0)
Platelets: 313 10*3/uL (ref 150–400)
RBC: 4.99 MIL/uL (ref 3.87–5.11)
RDW: 14.3 % (ref 11.5–15.5)
WBC: 7.6 10*3/uL (ref 4.0–10.5)
nRBC: 0 % (ref 0.0–0.2)

## 2023-08-15 LAB — HEMOGLOBIN A1C
Hgb A1c MFr Bld: 6.2 % — ABNORMAL HIGH (ref 4.8–5.6)
Mean Plasma Glucose: 131 mg/dL

## 2023-08-15 LAB — URINE CULTURE: Culture: 10000 — AB

## 2023-08-15 LAB — MAGNESIUM: Magnesium: 2.3 mg/dL (ref 1.7–2.4)

## 2023-08-15 MED ORDER — HALOPERIDOL LACTATE 5 MG/ML IJ SOLN
2.0000 mg | Freq: Four times a day (QID) | INTRAMUSCULAR | Status: DC | PRN
  Administered 2023-08-16 – 2023-08-20 (×5): 2 mg via INTRAVENOUS
  Filled 2023-08-15 (×6): qty 1

## 2023-08-15 MED ORDER — HALOPERIDOL LACTATE 5 MG/ML IJ SOLN
2.0000 mg | Freq: Four times a day (QID) | INTRAMUSCULAR | Status: DC | PRN
  Administered 2023-08-15 – 2023-08-29 (×4): 2 mg via INTRAMUSCULAR
  Filled 2023-08-15 (×4): qty 1

## 2023-08-15 MED ORDER — SODIUM CHLORIDE 0.9 % IV BOLUS
1000.0000 mL | Freq: Once | INTRAVENOUS | Status: AC
  Administered 2023-08-15: 1000 mL via INTRAVENOUS

## 2023-08-15 NOTE — Progress Notes (Signed)
amlodipine lisinopril and hydroclorotiazide held BP 97/59  MD Candelaria Stagers notified via epic chat.  Currently bolus infusing. Encourage pt increase oral intake.  Patient confused.

## 2023-08-15 NOTE — Progress Notes (Signed)
PROGRESS NOTE  Shannon Barrett NWG:[id number] DOB: 1956-07-13   PCP: Randell Patient [facility]  Patient is from: Home  DOA: 08/12/2023 LOS: 3  Chief complaints Chief Complaint  Patient presents with   Altered Mental Status     Brief Narrative / Interim history: 68 y.o. F with PMH of DM-2 and HTN brought to ED due to confusion, visual and auditory hallucination.  Reportedly with known bedbugs and bites all over.  Per POA report to triage nurse, patient was not showering or taking her medications.  Patient does confused and only oriented to self on presentation.  In ED, slightly hypertensive.  Basic labs without significant finding.  UA with large LE and rare bacteria.  UDS positive for benzo.  Does not seem to prescription for benzo.  CT head and CXR without significant finding.  Patient was given IM Geodon 10 mg x 1, IM Haldol 2 mg x 1, Benadryl 25 mg p.o. x 1 and IV ceftriaxone.  Admission requested for acute encephalopathy in the setting of presumed UTI.   Patient continued to be confused and agitated at times.  Per discussion with patient's daughter, rapid cognitive decline over the last 2 months.  Neurology consulted and recommended MRI brain with and transferred to [facility] for further workup.  EEG negative for seizure or epilepsy.  MRI brain with/without contrast attempted but only diffusion sequence obtained and is reassuring.  Neurology following and planning LP for CSF studies, CT chest/abdomen/pelvis to rule out occult malignancy and labs for nutritional deficiencies.  Subjective: Seen and examined earlier this morning.  No major events overnight of this morning.  Sleepy for most part.  Per Recruitment consultant, she woke up this morning and went to the bathroom.  After she returned, she tried to walk out of the room but redirectable.  She has been sleepy since then.  Has not eaten breakfast yet.  Patient wakes to voice.  She is oriented only to self and follows command.    Objective: Vitals:   08/14/23 1922 08/15/23 0430 08/15/23 0952 08/15/23 1200  BP: 126/67 118/68 (!) 97/59 109/66  Pulse: 100 76 78 81  Resp: 17 15    Temp: 98.2 F (36.8 C) 97.7 F (36.5 C)    TempSrc: Oral Axillary    SpO2: 100% 100%    Weight:      Height:        Examination:  GENERAL: No apparent distress.  Nontoxic. HEENT: MMM.  Vision and hearing grossly intact.  NECK: Supple.  No apparent JVD.  RESP:  No IWOB.  Fair aeration bilaterally. CVS:  RRR. Heart sounds normal.  ABD/GI/GU: BS+. Abd soft, NTND.  MSK/EXT:  Moves extremities. No apparent deformity. No edema.  SKIN: no apparent skin lesion or wound NEURO: Awake and alert.  Oriented to self.  Follows commands.  No apparent focal neuro deficit. PSYCH: Calm. Normal affect.   Procedures:  None  Microbiology summarized: Urine culture pending  Assessment and plan: Confusion/agitation/audiovisual hallucination: Suspect major cognitive impairment.  Per daughter, no formal diagnosis of dementia but he does not see provider much.  Daughter noted some cognitive decline over the last 1 year that has progressed quickly in the last 2 months.  She is confused without a visual hallucination, sundowning, wandering and self-neglect.  At times, she does not even recognize her daughter.  He does not take her regular meds.  Per daughter, does not have regular PCP but goes to health department times. UDS positive for benzodiazepines  without prescription.  UA with pyuria but she does not have fever or leukocytosis.  No suprapubic tenderness either.  CT head without significant finding.  RPR, CRP, HIV, TSH, folate and B12 within normal.  EEG with mild diffuse encephalopathy but no seizure or epileptiform discharge.  MRI brain as above.  Patient is currently sleepy but wakes to voice.  She is oriented to self and follows command.  No focal neurodeficit.  Agitation and hallucination seems to have resolved. -Appreciate help by neurology -LP  for CSF studies,  -CT chest/abdomen/pelvis to rule out occult malignancy -Labs for nutritional deficiencies. -Antibiotics for UTI -Vitamin B12 supplementation -Delirium precaution -Appreciate psychiatry input-continue low-dose Zyprexa.  IV Haldol as needed.  QTc 426. -Continue one-to-one Recruitment consultant.  Will try discontinuing restraints.  Pyuria: Unclear if this is UTI or just pyuria.  She has no fever, leukocytosis or suprapubic tenderness.  Urine culture with multiple species.  Received ceftriaxone from 1/24-1/27.  AKI: Likely prerenal from poor p.o. intake, lisinopril and HCTZ..  Slightly hypotensive this morning. Recent Labs    08/12/23 1839 08/13/23 0622 08/14/23 0610 08/15/23 0601  BUN 14 14 12  27*  CREATININE 0.89 1.61 1.11* 1.36*  -Discontinue lisinopril/HCTZ -IV NS bolus 1 L at 250 cc an hour -Recheck urine function in the morning   NIDDM-2: On metformin at home but does not take. Recent Labs  Lab 08/14/23 1124 08/14/23 1610 08/14/23 2051 08/15/23 0647 08/15/23 1216  GLUCAP 95 106* 128* 114* 92  -Check hemoglobin A1c -SSI sensitive pending A1c  Essential hypertension: Slightly hypotensive today. -Discontinue lisinopril and HCTZ -Continue amlodipine -P.o. hydralazine as needed  Microcytic anemia: Stable.  Anemia panel with mild iron deficiency.  B12 305. Recent Labs    08/12/23 1839 08/13/23 0622 08/14/23 0610 08/15/23 0601  HGB 12.4 11.9* 13.3 13.3  -B12 supplementation as above -Continue monitoring  Physical deconditioning -PT/OT eval  Advance care planning: Discussed CODE STATUS with patient's daughter.  Patient is full code.  Body mass index is 26.17 kg/m.           DVT prophylaxis:  enoxaparin (LOVENOX) injection 40 mg Start: 08/15/23 2000 SCDs Start: 08/13/23 0543  Code Status: Full code Family Communication: Updated patient's daughter over the phone on 1/27. Level of care: Med-Surg Status is: Inpatient Remains inpatient  appropriate because: Agitation/psychosis/confusion and AKI.   Final disposition: To be determined Consultants:  Neurology Psychiatry  55 minutes with more than 50% spent in reviewing records, counseling patient/family and coordinating care.   Sch Meds:  Scheduled Meds:  amLODipine  10 mg Oral Daily   cyanocobalamin  1,000 mcg Intramuscular Daily   Followed by   Melene Muller ON 08/17/2023] vitamin B-12  1,000 mcg Oral Daily   enoxaparin (LOVENOX) injection  40 mg Subcutaneous Q24H   insulin aspart  0-9 Units Subcutaneous TID WC   OLANZapine  2.5 mg Oral q AM   And   OLANZapine  5 mg Oral QHS   Continuous Infusions:  cefTRIAXone (ROCEPHIN)  IV 1 g (08/15/23 0944)   PRN Meds:.acetaminophen **OR** acetaminophen, haloperidol lactate, hydrALAZINE, ondansetron **OR** ondansetron (ZOFRAN) IV  Antimicrobials: Anti-infectives (From admission, onward)    Start     Dose/Rate Route Frequency Ordered Stop   08/13/23 1000  cefTRIAXone (ROCEPHIN) 1 g in sodium chloride 0.9 % 100 mL IVPB        1 g 200 mL/hr over 30 Minutes Intravenous Every 24 hours 08/13/23 0530     08/12/23 2300  cefTRIAXone (ROCEPHIN) 2 g  in sodium chloride 0.9 % 100 mL IVPB        2 g 200 mL/hr over 30 Minutes Intravenous  Once 08/12/23 2245 08/13/23 0018        I have personally reviewed the following labs and images: CBC: Recent Labs  Lab 08/12/23 1839 08/13/23 0622 08/14/23 0610 08/15/23 0601  WBC 5.8 5.0 5.9 7.6  NEUTROABS 2.7  --   --   --   HGB 12.4 11.9* 13.3 13.3  HCT 35.9* 35.7* 37.7 38.2  MCV 78.2* 78.8* 76.0* 76.6*  PLT 272 262 298 313   BMP &GFR Recent Labs  Lab 08/12/23 1839 08/13/23 0622 08/14/23 0610 08/15/23 0601  NA 138 139 138 137  K 3.9 3.6 4.0 3.6  CL 105 108 105 103  CO2 25 23 23 23   GLUCOSE 108* 103* 105* 121*  BUN 14 14 12  27*  CREATININE 0.89 0.81 1.11* 1.36*  CALCIUM 9.3 9.2 9.3 9.5  MG  --  2.1 2.0 2.3  PHOS  --  3.3 4.1 4.1   Estimated Creatinine Clearance: 36 mL/min  (A) (by C-G formula based on SCr of 1.36 mg/dL (H)). Liver & Pancreas: Recent Labs  Lab 08/12/23 1839 08/13/23 0622 08/14/23 0610 08/15/23 0601  AST 28 25  --   --   ALT 18 17  --   --   ALKPHOS 66 66  --   --   BILITOT 0.7 0.2  --   --   PROT 8.5* 7.8  --   --   ALBUMIN 3.8 3.7 3.5 3.5   No results for input(s): "LIPASE", "AMYLASE" in the last 168 hours. No results for input(s): "AMMONIA" in the last 168 hours. Diabetic: Recent Labs    08/13/23 0622  HGBA1C 6.2*   Recent Labs  Lab 08/14/23 1124 08/14/23 1610 08/14/23 2051 08/15/23 0647 08/15/23 1216  GLUCAP 95 106* 128* 114* 92   Cardiac Enzymes: No results for input(s): "CKTOTAL", "CKMB", "CKMBINDEX", "TROPONINI" in the last 168 hours. No results for input(s): "PROBNP" in the last 8760 hours. Coagulation Profile: No results for input(s): "INR", "PROTIME" in the last 168 hours. Thyroid Function Tests: Recent Labs    08/13/23 0622  TSH 3.854   Lipid Profile: No results for input(s): "CHOL", "HDL", "LDLCALC", "TRIG", "CHOLHDL", "LDLDIRECT" in the last 72 hours. Anemia Panel: Recent Labs    08/13/23 0622 08/13/23 1911 08/14/23 1110  VITAMINB12 305  --   --   FOLATE  --  15.8 15.4  FERRITIN 36  --   --   TIBC 340  --   --   IRON 41  --   --    Urine analysis:    Component Value Date/Time   COLORURINE YELLOW 08/12/2023 1752   APPEARANCEUR HAZY (A) 08/12/2023 1752   LABSPEC 1.021 08/12/2023 1752   PHURINE 6.0 08/12/2023 1752   GLUCOSEU NEGATIVE 08/12/2023 1752   HGBUR NEGATIVE 08/12/2023 1752   BILIRUBINUR NEGATIVE 08/12/2023 1752   KETONESUR NEGATIVE 08/12/2023 1752   PROTEINUR 30 (A) 08/12/2023 1752   NITRITE NEGATIVE 08/12/2023 1752   LEUKOCYTESUR LARGE (A) 08/12/2023 1752   Sepsis Labs: Invalid input(s): "PROCALCITONIN", "LACTICIDVEN"  Microbiology: Recent Results (from the past 240 hours)  Urine Culture     Status: Abnormal   Collection Time: 08/12/23  5:52 PM   Specimen: Urine, Clean  Catch  Result Value Ref Range Status   Specimen Description   Final    URINE, CLEAN CATCH Performed at [facility], [address], [place], [place] [address]    Special Requests   Final    NONE Performed at [facility], [address], [place], [place] [address]    Culture MULTIPLE SPECIES PRESENT, SUGGEST RECOLLECTION (A)  Final   Report Status 08/15/2023 FINAL  Final    Radiology Studies: No results found.     Jakayla Schweppe T. Tashari Schoenfelder [facility]  If 7PM-7AM, please contact night-coverage [email or web address] 08/15/2023, 2:17 PM

## 2023-08-15 NOTE — Plan of Care (Signed)
  Problem: Safety: Goal: Non-violent Restraint(s) Outcome: Progressing   Problem: Metabolic: Goal: Ability to maintain appropriate glucose levels will improve Outcome: Progressing   Problem: Nutritional: Goal: Maintenance of adequate nutrition will improve Outcome: Progressing   Problem: Skin Integrity: Goal: Risk for impaired skin integrity will decrease Outcome: Progressing   Problem: Tissue Perfusion: Goal: Adequacy of tissue perfusion will improve Outcome: Progressing

## 2023-08-15 NOTE — TOC Progression Note (Signed)
Transition of Care [facility]) - Progression Note    Patient Details  Name: Shannon Barrett MRN: [id number] Date of Birth: Jul 26, 1955  Transition of Care [facility]) CM/SW Contact  Ronny Bacon, RN Phone Number: 08/15/2023, 10:55 AM  Clinical Narrative:   During progression rounds, nurse reported that patient was in bilateral restraints. Upon chart review, PT/OT, CT abd and pelvis ordered. TOC will continue to follow for discharging needs.      Barriers to Discharge: Continued Medical Work up  Expected Discharge Plan and Services                                               Social Determinants of Health (SDOH) Interventions SDOH Screenings   Food Insecurity: Patient Unable To Answer (08/13/2023)  Housing: Patient Unable To Answer (08/13/2023)  Transportation Needs: Patient Unable To Answer (08/13/2023)  Utilities: Patient Unable To Answer (08/13/2023)  Social Connections: Unknown (08/13/2023)  Tobacco Use: Low Risk  (08/12/2023)    Readmission Risk Interventions     No data to display

## 2023-08-15 NOTE — Progress Notes (Signed)
NEUROLOGY CONSULT FOLLOW UP NOTE   Date of service: August 15, 2023 Patient Name: Shannon Barrett MRN:  [id number] DOB:  07/19/1956  Interval Hx/subjective  -Patient has remained hemodynamically stable and afebrile.  Lumbar puncture was unable to be performed yesterday as patient had received Lovenox in the morning, will attempt LP at bedside today.  - UDS + for benzos though no prescription for benzodiazepines present on patient's home medication list and urine was obtained prior to any hospital administration of benzodiazepine medications.  PMP search reveals no prescriptions for benzodiazepines within the last 5 years.   - Further history obtained from patient's niece on the telephone on 1/26.  Patient's first noted confusion was in September at her mother's funeral.  The patient initially said that her mother looked so beautiful at her viewing and then while sitting during the ceremony, the patient asked her family whose funeral they were at.  She continued to have some episodes of confusion but family reports that she was redirectable.  The patient was described as an anxious person with slightly obsessive cleaning tendencies prior to the last few months with some suspected underlying psychiatric issues related to her anxiety and obsessive tendencies but was never diagnosed with any underlying psychiatric disorders.  The patient's daughter manages her finances but the patient had been living independently in an apartment able to take care of her own ADLs.  Over the past 2 months, the patient became less active, unmotivated to get out of bed, stopped caring for herself or her home with a bedbug infestation and stopped bathing herself which is extremely out of character for the patient as she is typically obsessively clean.  Family notes that the patient's hair was matted and her home looked as if she was beginning to hoard items.  The patient began to talk to herself but it was unclear if she was  hallucinating as she was initially able to be redirected.   The patient recently has become combative at night as well around 6-8 PM.  The patient also has had increased confusion talking about visiting her family members that have passed away including her mother and sisters and also has rambled about snakes at a store as children and then reported that she had snakes under her refrigerator.  These conversations have become more sporadic and the patient has become less redirectable over the past 1 to 2 months.  The family became increasingly concerned when the apartment manager notified family that the patient was walking outside without shoes or appropriate clothing and below freezing temperatures as she is wandering off.  The patient was noted to have stuffed animals and her dog that are treated as her "babies" that she commonly talks to.  Vitals   Vitals:   08/14/23 1922 08/15/23 0430 08/15/23 0952 08/15/23 1200  BP: 126/67 118/68 (!) 97/59 109/66  Pulse: 100 76 78 81  Resp: 17 15    Temp: 98.2 F (36.8 C) 97.7 F (36.5 C)    TempSrc: Oral Axillary    SpO2: 100% 100%    Weight:      Height:        Body mass index is 26.17 kg/m.  Physical Exam   Constitutional: Well-nourished, well-developed African American female laying in hospital bed, in no acute distress Psych: Patient appears pleasantly confused but is calm throughout assessment without agitation.  Eyes: No scleral injection.  HENT: No OP obstrucion.  Head: Normocephalic.  Respiratory: Effort normal, non-labored breathing on room air.  Neurologic Examination  Mental Status: Patient patient rests with eyes closed and does not open them to request.  She responds to her name and is able to state her name but states that she is in a movie theater when she is asked where she is.  She is unable to recall that she is in the hospital after being reoriented.  She is unable to answer most questions and cannot do simple addition.  She  can intermittently follow simple commands.  Voice is noted to be soft, hypophonic and dysarthric. Cranial Nerves: II:  PERRL. Does not participate in formal visual testing.  III,IV, VI: Does not focus or track examiner V: Reacts to light touch on bilateral face VII: Face is symmetric resting and with movement VIII: Hearing is intact to voice X: Does not allow for visualization of soft palate XI: Head moves equally to the left and right. XII: Noncooperative with tongue protrusion Motor: Tone is normal. Bulk is normal.  Able to move all 4 extremities with antigravity strength, symmetrically Sensory: Reacts to light touch throughout.  Cerebellar: Patient does not perform  Medications  Current Facility-Administered Medications:    acetaminophen (TYLENOL) tablet 650 mg, 650 mg, Oral, Q6H PRN **OR** acetaminophen (TYLENOL) suppository 650 mg, 650 mg, Rectal, Q6H PRN, Adefeso, Oladapo, DO   amLODipine (NORVASC) tablet 10 mg, 10 mg, Oral, Daily, Alanda Slim, Taye T, MD, 10 mg at 08/14/23 1114   cefTRIAXone (ROCEPHIN) 1 g in sodium chloride 0.9 % 100 mL IVPB, 1 g, Intravenous, Q24H, Adefeso, Oladapo, DO, Last Rate: 200 mL/hr at 08/15/23 0944, 1 g at 08/15/23 0944   cyanocobalamin (VITAMIN B12) injection 1,000 mcg, 1,000 mcg, Intramuscular, Daily, 1,000 mcg at 08/15/23 0943 **FOLLOWED BY** [START ON 08/17/2023] cyanocobalamin (VITAMIN B12) tablet 1,000 mcg, 1,000 mcg, Oral, Daily, Gonfa, Taye T, MD   enoxaparin (LOVENOX) injection 40 mg, 40 mg, Subcutaneous, Q24H, Bhagat, Srishti L, MD   haloperidol lactate (HALDOL) injection 2 mg, 2 mg, Intravenous, Q6H PRN, Alanda Slim, Taye T, MD, 2 mg at 08/13/23 1402   hydrALAZINE (APRESOLINE) injection 10 mg, 10 mg, Intravenous, Q6H PRN, Adefeso, Oladapo, DO   insulin aspart (novoLOG) injection 0-9 Units, 0-9 Units, Subcutaneous, TID WC, Adefeso, Oladapo, DO   OLANZapine (ZYPREXA) tablet 2.5 mg, 2.5 mg, Oral, q AM, 2.5 mg at 08/15/23 0635 **AND** OLANZapine (ZYPREXA)  tablet 5 mg, 5 mg, Oral, QHS, Gonfa, Taye T, MD, 5 mg at 08/14/23 2217   ondansetron (ZOFRAN) tablet 4 mg, 4 mg, Oral, Q6H PRN **OR** ondansetron (ZOFRAN) injection 4 mg, 4 mg, Intravenous, Q6H PRN, Adefeso, Oladapo, DO  Labs and Diagnostic Imaging   CBC:  Recent Labs  Lab 08/12/23 1839 08/13/23 0622 08/14/23 0610 08/15/23 0601  WBC 5.8   < > 5.9 7.6  NEUTROABS 2.7  --   --   --   HGB 12.4   < > 13.3 13.3  HCT 35.9*   < > 37.7 38.2  MCV 78.2*   < > 76.0* 76.6*  PLT 272   < > 298 313   < > = values in this interval not displayed.   Basic Metabolic Panel:  Lab Results  Component Value Date   NA 137 08/15/2023   K 3.6 08/15/2023   CO2 23 08/15/2023   GLUCOSE 121 (H) 08/15/2023   BUN 27 (H) 08/15/2023   CREATININE 1.36 (H) 08/15/2023   CALCIUM 9.5 08/15/2023   GFRNONAA 43 (L) 08/15/2023   Lipid Panel: No results found for: "LDLCALC" HgbA1c:  Lab Results  Component Value Date   HGBA1C 6.2 (H) 08/13/2023   Urine Drug Screen:     Component Value Date/Time   LABOPIA NONE DETECTED 08/12/2023 1752   COCAINSCRNUR NONE DETECTED 08/12/2023 1752   LABBENZ POSITIVE (A) 08/12/2023 1752   AMPHETMU NONE DETECTED 08/12/2023 1752   THCU NONE DETECTED 08/12/2023 1752   LABBARB NONE DETECTED 08/12/2023 1752    Alcohol Level     Component Value Date/Time   [facility] <10 08/12/2023 1839   Lab Results  Component Value Date   VITAMINB12 305 08/13/2023   Last vitamin D Lab Results  Component Value Date   VD25OH 22.97 (L) 08/14/2023  Folate 15.4 Lab Results  Component Value Date   ESRSEDRATE 37 (H) 08/13/2023   Lab Results  Component Value Date   TSH 3.854 08/13/2023   Lab Results  Component Value Date   CRP 0.7 08/13/2023  RPR Non reactive Thiamine pending Vitamin A pending Folate 15.4 B6 pending  CT Head without contrast 08/12/23 (Personally reviewed): No CT evidence for acute intracranial abnormality. Mild atrophy.   MRI Brain: Motion limited DWI images obtained with  no signs of acute or subacute infarct  rEEG:  "This study is suggestive of mild diffuse encephalopathy. No seizures or epileptiform discharges were seen throughout the recording. "  Assessment   Shannon Barrett is a 68 y.o. female with a PMHx of HTN and DM2 presenting to the ED 08/12/23 for evaluation of confusion, visual and auditory hallucinations with progressive functional and cognitive decline since September of 2024 initially with redirectable confusion progressing to hallucinations, self-neglect, sundowning, and patient wondering off over the last 1-2 months.  Initial workup revealed patient with multiple bedbug bites and urine with findings consistent with a pyuria versus UTI and she was started on antibiotics pending a urine culture.  Patient was transferred to [facility] for further neurology evaluation of her cognitive decline.  Given the rapid cognitive decline, there is concern for an autoimmune encephalitis process therefore will complete EEG, MRI, lumbar puncture for CSF diagnostics.  Of note, UDS was positive for benzodiazepines, which were not prescribed for patient.  Upon further discussion with family, patient was noted to have memory and cognitive issues since September.  She first had episodes of confusion but was redirectable.  She was noted to have stopped caring for herself or cleaning her home, which was out of character for patient.  Recently, family had been informed by patient's landlord that patient had been walking outside in cold weather without shoes or appropriate clothing.  Recommendations  - MRI brain with and without contrast attempted, only diffusion sequences obtained which were reassuring   -Will need to reattempt to with more sedation - LP with CSF autoimmune encephalitis panel, cell count with differential, glucose, protein, gram stain, and culture - [facility] Autoimmune Encephalopathy Panel (serum) ordered) - CT chest/abdomen/pelvis for occult malignancy screening -  Continue evaluation and treatment of infectious process - Follow up on labs: B6, thiamine, Vitamin A - Delirium precautions  - Neurology will continue to follow ______________________________________________________________________  Patient seen by NP and then by MD, MD to edit note as needed. Cortney E Ernestina Columbia , MSN, AGACNP-BC [facility] See Amion for schedule and pager information 08/15/2023 1:01 PM    Attending Neurohospitalist Addendum Patient seen and examined with APP/Resident. Agree with the history and physical as documented above. Agree with the plan as documented, which I helped formulate. I have edited the note above to reflect my full findings and  recommendations. I have independently reviewed the chart, obtained history, review of systems and examined the patient.I have personally reviewed pertinent head/neck/spine imaging (CT/MRI). Please feel free to call with any questions.  -- Bing Neighbors, MD [facility] [phone]  If 7pm- 7am, please page neurology on call as listed in AMION.

## 2023-08-15 NOTE — ED Provider Notes (Addendum)
[facility]  SURGICAL Provider Note   CSN: [id number] Arrival date & time: 08/12/23  1721     History  Chief Complaint  Patient presents with   Altered Mental Status    Shannon Barrett is a 68 y.o. female.  Patient presents with confusion and visual and auditory hallucinations.  Patient has a history of hypertension  The history is provided by the patient and medical records. No language interpreter was used.  Altered Mental Status Presenting symptoms: disorientation   Severity:  Moderate Most recent episode:  More than 2 days ago Episode history:  Continuous Timing:  Constant Progression:  Worsening Chronicity:  New Context: not alcohol use   Associated symptoms: no abdominal pain        Home Medications Prior to Admission medications   Medication Sig Start Date End Date Taking? Authorizing Provider  albuterol (PROVENTIL HFA) 108 (90 Base) MCG/ACT inhaler Inhale 2 puffs into the lungs every 4 (four) hours as needed for wheezing or shortness of breath. Patient not taking: Reported on 08/13/2023    [provider]  cetirizine (ZYRTEC) 10 MG tablet Take 10 mg by mouth daily. Patient not taking: Reported on 08/13/2023    [provider]  cyclobenzaprine (FLEXERIL) 10 MG tablet Take 10 mg by mouth 2 (two) times daily as needed for muscle spasms. Patient not taking: Reported on 08/13/2023    [provider]  lisinopril-hydrochlorothiazide (ZESTORETIC) 20-12.5 MG tablet Take 1 tablet by mouth daily. Patient not taking: Reported on 08/13/2023    [provider]  metFORMIN (GLUCOPHAGE-XR) 500 MG 24 hr tablet Take 500 mg by mouth in the morning and at bedtime. Patient not taking: Reported on 08/13/2023    [provider]  naproxen (NAPROSYN) 500 MG tablet Take 500 mg by mouth 2 (two) times daily as needed. Patient not taking: Reported on 08/13/2023    [provider]      Allergies    Amoxicillin,  Aspirin, Claritin [loratadine], Ibuprofen, Peanut-containing drug products, Penicillins, Shellfish-derived products, and Strawberry (diagnostic)    Review of Systems   Review of Systems  Unable to perform ROS: Mental status change  Gastrointestinal:  Negative for abdominal pain.    Physical Exam Updated Vital Signs BP 109/66   Pulse 81   Temp 97.7 F (36.5 C) (Axillary)   Resp 15   Ht 5\' 2"  (1.575 m)   Wt 64.9 kg   SpO2 100%   BMI 26.17 kg/m  Physical Exam Vitals and nursing note reviewed.  Constitutional:      Appearance: She is well-developed.  HENT:     Head: Normocephalic.     Nose: Nose normal.  Eyes:     General: No scleral icterus.    Conjunctiva/sclera: Conjunctivae normal.  Neck:     Thyroid: No thyromegaly.  Cardiovascular:     Rate and Rhythm: Normal rate and regular rhythm.     Heart sounds: No murmur heard.    No friction rub. No gallop.  Pulmonary:     Breath sounds: No stridor. No wheezing or rales.  Chest:     Chest wall: No tenderness.  Abdominal:     General: There is no distension.     Tenderness: There is no abdominal tenderness. There is no rebound.  Musculoskeletal:        General: Normal range of motion.     Cervical back: Neck supple.  Lymphadenopathy:     Cervical: No cervical adenopathy.  Skin:  Findings: No erythema or rash.  Neurological:     Mental Status: She is alert.     Motor: No abnormal muscle tone.     Coordination: Coordination normal.     Comments: Oriented to person only  Psychiatric:     Comments: Agitated     ED Results / Procedures / Treatments   Labs (all labs ordered are listed, but only abnormal results are displayed) Labs Reviewed  URINE CULTURE - Abnormal; Notable for the following components:      Result Value   Culture MULTIPLE SPECIES PRESENT, SUGGEST RECOLLECTION (*)    All other components within normal limits  CBC WITH DIFFERENTIAL/PLATELET - Abnormal; Notable for the following components:    HCT 35.9 (*)    MCV 78.2 (*)    All other components within normal limits  COMPREHENSIVE METABOLIC PANEL - Abnormal; Notable for the following components:   Glucose, Bld 108 (*)    Total Protein 8.5 (*)    All other components within normal limits  URINALYSIS, ROUTINE W REFLEX MICROSCOPIC - Abnormal; Notable for the following components:   APPearance HAZY (*)    Protein, ur 30 (*)    Leukocytes,Ua LARGE (*)    Bacteria, UA RARE (*)    All other components within normal limits  RAPID URINE DRUG SCREEN, HOSP PERFORMED - Abnormal; Notable for the following components:   Benzodiazepines POSITIVE (*)    All other components within normal limits  HEMOGLOBIN A1C - Abnormal; Notable for the following components:   Hgb A1c MFr Bld 6.2 (*)    All other components within normal limits  CBC - Abnormal; Notable for the following components:   Hemoglobin 11.9 (*)    HCT 35.7 (*)    MCV 78.8 (*)    All other components within normal limits  COMPREHENSIVE METABOLIC PANEL - Abnormal; Notable for the following components:   Glucose, Bld 103 (*)    All other components within normal limits  RENAL FUNCTION PANEL - Abnormal; Notable for the following components:   Glucose, Bld 105 (*)    Creatinine, Ser 1.11 (*)    GFR, Estimated 55 (*)    All other components within normal limits  CBC - Abnormal; Notable for the following components:   MCV 76.0 (*)    All other components within normal limits  SEDIMENTATION RATE - Abnormal; Notable for the following components:   Sed Rate 37 (*)    All other components within normal limits  GLUCOSE, CAPILLARY - Abnormal; Notable for the following components:   Glucose-Capillary 117 (*)    All other components within normal limits  GLUCOSE, CAPILLARY - Abnormal; Notable for the following components:   Glucose-Capillary 100 (*)    All other components within normal limits  VITAMIN D 25 HYDROXY (VIT D DEFICIENCY, FRACTURES) - Abnormal; Notable for the following  components:   Vit D, 25-Hydroxy 22.97 (*)    All other components within normal limits  GLUCOSE, CAPILLARY - Abnormal; Notable for the following components:   Glucose-Capillary 106 (*)    All other components within normal limits  RENAL FUNCTION PANEL - Abnormal; Notable for the following components:   Glucose, Bld 121 (*)    BUN 27 (*)    Creatinine, Ser 1.36 (*)    GFR, Estimated 43 (*)    All other components within normal limits  CBC - Abnormal; Notable for the following components:   MCV 76.6 (*)    All other components within normal limits  GLUCOSE, CAPILLARY - Abnormal; Notable for the following components:   Glucose-Capillary 128 (*)    All other components within normal limits  GLUCOSE, CAPILLARY - Abnormal; Notable for the following components:   Glucose-Capillary 114 (*)    All other components within normal limits  CBG MONITORING, ED - Abnormal; Notable for the following components:   Glucose-Capillary 106 (*)    All other components within normal limits  ETHANOL  VITAMIN B12  TSH  IRON AND TIBC  FERRITIN  HIV ANTIBODY (ROUTINE TESTING W REFLEX)  MAGNESIUM  PHOSPHORUS  GLUCOSE, CAPILLARY  GLUCOSE, CAPILLARY  GLUCOSE, CAPILLARY  MAGNESIUM  RPR  FOLATE  C-REACTIVE PROTEIN  FOLATE  GLUCOSE, CAPILLARY  MAGNESIUM  GLUCOSE, CAPILLARY  CREATININE, SERUM  MISC [facility] TEST (SEND OUT)  VITAMIN B6  VITAMIN A  VITAMIN B1  MISC [facility] TEST (SEND OUT)    EKG None  Radiology MR BRAIN WO CONTRAST Result Date: 08/14/2023 CLINICAL DATA:  Mental status change, unknown cause. The examination had to be discontinued prior to completion due to patient removed herself from the scanner. EXAM: MRI HEAD WITHOUT CONTRAST TECHNIQUE: Multiplanar, multiecho pulse sequences of the brain and surrounding structures were obtained without intravenous contrast. COMPARISON:  CT head without contrast 08/12/2023 FINDINGS: Brain: Only diffusion-weighted images were obtained before the  patient refused further imaging. The diffusion-weighted images demonstrate no acute or subacute infarction. No significant T2 shine through is present. IMPRESSION: 1. Only diffusion-weighted images were obtained before the patient refused further imaging. 2. No acute or subacute infarction. Electronically Signed   By: Marin Roberts M.D.   On: 08/14/2023 12:28   EEG adult Result Date: 08/14/2023 Charlsie Quest, MD     08/14/2023  9:36 AM Patient Name: Shannon Barrett MRN: [id number] Epilepsy Attending: Charlsie Quest Referring Physician/Provider: Caryl Pina, MD Date: 08/14/2023 Duration: 22.45 mins Patient history: 68 year old female with a PMHx of DM, HTN and night muscle spasms who presents from home for assessment of rapidly progressing cognitive decline. EEG to evaluate for seizure Level of alertness: Awake, asleep AEDs during EEG study: None Technical aspects: This EEG study was done with scalp electrodes positioned according to the 10-20 International system of electrode placement. Electrical activity was reviewed with band pass filter of 1-70Hz , sensitivity of 7 uV/mm, display speed of 38mm/sec with a 60Hz  notched filter applied as appropriate. EEG data were recorded continuously and digitally stored.  Video monitoring was available and reviewed as appropriate. Description: The posterior dominant rhythm consists of 8 Hz activity of moderate voltage (25-35 uV) seen predominantly in posterior head regions, symmetric and reactive to eye opening and eye closing. Sleep was characterized by vertex waves, sleep spindles (12 to 14 Hz), maximal frontocentral region.  EEG showed intermittent generalized 3 to 6 Hz theta-delta slowing. Hyperventilation and photic stimulation were not performed.   ABNORMALITY - Intermittent slow, generalized IMPRESSION: This study is suggestive of mild diffuse encephalopathy. No seizures or epileptiform discharges were seen throughout the recording. Charlsie Quest     Procedures Procedures    Medications Ordered in ED Medications  cefTRIAXone (ROCEPHIN) 1 g in sodium chloride 0.9 % 100 mL IVPB (1 g Intravenous New Bag/Given 08/15/23 0944)  hydrALAZINE (APRESOLINE) injection 10 mg (has no administration in time range)  insulin aspart (novoLOG) injection 0-9 Units ( Subcutaneous Patient Refused/Not Given 08/15/23 1231)  acetaminophen (TYLENOL) tablet 650 mg (has no administration in time range)    Or  acetaminophen (TYLENOL) suppository 650 mg (has no  administration in time range)  ondansetron (ZOFRAN) tablet 4 mg (has no administration in time range)    Or  ondansetron (ZOFRAN) injection 4 mg (has no administration in time range)  cyanocobalamin (VITAMIN B12) injection 1,000 mcg (1,000 mcg Intramuscular Given 08/15/23 0943)    Followed by  cyanocobalamin (VITAMIN B12) tablet 1,000 mcg (has no administration in time range)  amLODipine (NORVASC) tablet 10 mg (10 mg Oral Not Given 08/15/23 0952)  OLANZapine (ZYPREXA) tablet 2.5 mg (2.5 mg Oral Given 08/15/23 0635)    And  OLANZapine (ZYPREXA) tablet 5 mg (5 mg Oral Given 08/14/23 2217)  haloperidol lactate (HALDOL) injection 2 mg (2 mg Intravenous Given 08/13/23 1402)  enoxaparin (LOVENOX) injection 40 mg (has no administration in time range)  ziprasidone (GEODON) injection 10 mg (10 mg Intramuscular Given 08/12/23 2256)  cefTRIAXone (ROCEPHIN) 2 g in sodium chloride 0.9 % 100 mL IVPB (2 g Intravenous New Bag/Given 08/12/23 2348)  diphenhydrAMINE (BENADRYL) capsule 25 mg (25 mg Oral Given 08/13/23 0016)  haloperidol lactate (HALDOL) injection 2 mg (2 mg Intramuscular Given 08/13/23 0115)  LORazepam (ATIVAN) injection 2 mg (2 mg Intravenous Given 08/13/23 1439)  LORazepam (ATIVAN) injection 2 mg (2 mg Intravenous Given 08/14/23 1123)  sodium chloride 0.9 % bolus 1,000 mL (1,000 mLs Intravenous New Bag/Given 08/15/23 0946)    ED Course/ Medical Decision Making/ A&P  CRITICAL CARE Performed by: Bethann Berkshire Total critical care time: 45 minutes Critical care time was exclusive of separately billable procedures and treating other patients. Critical care was necessary to treat or prevent imminent or life-threatening deterioration. Critical care was time spent personally by me on the following activities: development of treatment plan with patient and/or surrogate as well as nursing, discussions with consultants, evaluation of patient's response to treatment, examination of patient, obtaining history from patient or surrogate, ordering and performing treatments and interventions, ordering and review of laboratory studies, ordering and review of radiographic studies, pulse oximetry and re-evaluation of patient's condition.                                Medical Decision Making Amount and/or Complexity of Data Reviewed Labs: ordered. Radiology: ordered.  Risk Prescription drug management. Decision regarding hospitalization.   Patient with UTI and altered mental status.  She is admitted to medicine        Final Clinical Impression(s) / ED Diagnoses Final diagnoses:  Acute cystitis with hematuria  Confusion    Rx / DC Orders ED Discharge Orders     None         Bethann Berkshire, MD 08/15/23 1247    Bethann Berkshire, MD 08/15/23 1247

## 2023-08-15 NOTE — Progress Notes (Signed)
Patient refused breakfast this AM, she did eat apple sauce. IV fluids infusing. Water given to patient.

## 2023-08-15 NOTE — Progress Notes (Signed)
IV occluded, IV team consulted, patient refused to get IV.  MD notified.

## 2023-08-15 NOTE — Evaluation (Addendum)
Physical Therapy Evaluation Patient Details Name: Shannon Barrett MRN: [id number] DOB: 07-13-1956 Today's Date: 08/15/2023  History of Present Illness  Pt is a 68 y/o F admitted on 08/12/23 after presenting with c/o confusion, visual & auditory hallucination. Pt's daughter reports a significant cognitive decline over the past 2 months. PMH: DM2, HTN  Clinical Impression  Pt seen for PT evaluation with co-tx with OT. Pt received asleep in bed, requiring extra time to awaken. Pt responds to name but unable to recall her birthday & not oriented to location, situation. Pt follows simple commands with extra time during session. Pt is able to ambulate in room & bathroom without AD with CGA before noting "that's enough". Pt would benefit from acute PT services to address balance, gait & safety with mobility.   Wrist restraints secured at end of session.      If plan is discharge home, recommend the following: A little help with walking and/or transfers;A little help with bathing/dressing/bathroom;Assistance with cooking/housework;Direct supervision/assist for financial management;Supervision due to cognitive status;Assist for transportation;Direct supervision/assist for medications management;Help with stairs or ramp for entrance   Can travel by private vehicle   Yes    Equipment Recommendations Other (comment) (defer to next venue)  Recommendations for Other Services       Functional Status Assessment Patient has had a recent decline in their functional status and demonstrates the ability to make significant improvements in function in a reasonable and predictable amount of time.     Precautions / Restrictions Precautions Precautions: Fall Restrictions Weight Bearing Restrictions Per Provider Order: No      Mobility  Bed Mobility Overal bed mobility: Needs Assistance Bed Mobility: Supine to Sit, Sit to Supine     Supine to sit: Contact guard, HOB elevated, Used rails Sit to  supine: Contact guard assist, HOB elevated, Used rails        Transfers Overall transfer level: Needs assistance Equipment used: None Transfers: Sit to/from Stand Sit to Stand: Contact guard assist                Ambulation/Gait Ambulation/Gait assistance: Contact guard assist Gait Distance (Feet): 15 Feet (+ 15 ft) Assistive device: None Gait Pattern/deviations: Decreased step length - right, Decreased stride length, Decreased step length - left Gait velocity: decreased     General Gait Details: ambulates bed>toilet>sink>bed  Stairs            Wheelchair Mobility     Tilt Bed    Modified Rankin (Stroke Patients Only)       Balance Overall balance assessment: Needs assistance Sitting-balance support: Feet supported Sitting balance-Leahy Scale: Fair     Standing balance support: No upper extremity supported, During functional activity Standing balance-Leahy Scale: Fair Standing balance comment: CGA standing at sink for hand hygiene                             Pertinent Vitals/Pain Pain Assessment Pain Assessment: No/denies pain    Home Living Family/patient expects to be discharged to:: Private residence Living Arrangements: Alone   Type of Home: Apartment             Additional Comments: Information from chart, pt unable to provide.    Prior Function Prior Level of Function : Patient poor historian/Family not available                     Extremity/Trunk Assessment   Upper Extremity Assessment  Upper Extremity Assessment: Generalized weakness    Lower Extremity Assessment Lower Extremity Assessment: Generalized weakness       Communication   Communication Communication: Difficulty communicating thoughts/reduced clarity of speech Cueing Techniques: Verbal cues;Gestural cues;Tactile cues;Visual cues  Cognition Arousal: Alert Behavior During Therapy: WFL for tasks assessed/performed Overall Cognitive Status:  Impaired/Different from baseline Area of Impairment: Orientation, Attention, Memory, Safety/judgement, Awareness, Following commands, Problem solving                 Orientation Level: Disoriented to, Place, Time, Situation (responds to name but does not recall her birthday) Current Attention Level: Sustained   Following Commands: Follows one step commands inconsistently, Follows one step commands with increased time Safety/Judgement: Decreased awareness of safety, Decreased awareness of deficits Awareness: Intellectual Problem Solving: Slow processing, Decreased initiation, Difficulty sequencing, Requires verbal cues, Requires tactile cues General Comments: Pt unable to report her birthday, current location. Follows simple commands throughout session inconsistently with extra time.        General Comments General comments (skin integrity, edema, etc.): Pt with continent void, performs peri hygiene without assistance. Requires cuing to locate toilet handle & paper towels.    Exercises     Assessment/Plan    PT Assessment Patient needs continued PT services  PT Problem List Decreased cognition;Decreased range of motion;Decreased activity tolerance;Decreased mobility;Decreased knowledge of use of DME;Decreased safety awareness;Decreased balance;Decreased strength;Decreased coordination       PT Treatment Interventions DME instruction;Balance training;Modalities;Neuromuscular re-education;Gait training;Stair training;Therapeutic activities;Therapeutic exercise;Patient/family education;Functional mobility training;Cognitive remediation    PT Goals (Current goals can be found in the Care Plan section)  Acute Rehab PT Goals PT Goal Formulation: Patient unable to participate in goal setting Time For Goal Achievement: 08/29/23 Potential to Achieve Goals: Fair    Frequency Min 1X/week     Co-evaluation PT/OT/SLP Co-Evaluation/Treatment: Yes Reason for Co-Treatment: Other  (comment) (unsure of pt's willingness to tolerate seperate sessions) PT goals addressed during session: Mobility/safety with mobility;Balance         AM-PAC PT "6 Clicks" Mobility  Outcome Measure Help needed turning from your back to your side while in a flat bed without using bedrails?: A Little Help needed moving from lying on your back to sitting on the side of a flat bed without using bedrails?: A Little Help needed moving to and from a bed to a chair (including a wheelchair)?: A Little Help needed standing up from a chair using your arms (e.g., wheelchair or bedside chair)?: A Little Help needed to walk in hospital room?: A Little Help needed climbing 3-5 steps with a railing? : A Lot 6 Click Score: 17    End of Session   Activity Tolerance: Patient limited by fatigue Patient left: in bed;with call bell/phone within reach;with bed alarm set Nurse Communication: Mobility status PT Visit Diagnosis: Muscle weakness (generalized) (M62.81);Unsteadiness on feet (R26.81)    Time: 1202-1215 PT Time Calculation (min) (ACUTE ONLY): 13 min   Charges:   PT Evaluation $PT Eval Low Complexity: 1 Low   PT General Charges $$ ACUTE PT VISIT: 1 Visit         Aleda Grana, PT, DPT 08/15/23, 12:58 PM   Sandi Mariscal 08/15/2023, 12:57 PM

## 2023-08-15 NOTE — Progress Notes (Signed)
Pt's daughter in the room. Restraints removed.  Patient steadily walking around the room. Educated daughter in the room and other daughter on the  phone  the reason the patient had restraints on. RN told them the patient is very confused and the restraints were more for safety since we were unable to find sitter.

## 2023-08-15 NOTE — Evaluation (Addendum)
Speech Language Pathology Evaluation Patient Details Name: Shannon Barrett MRN: [id number] DOB: Dec 14, 1955 Today's Date: 08/15/2023 Time: 1308-6578 SLP Time Calculation (min) (ACUTE ONLY): 10 min  Problem List:  Patient Active Problem List   Diagnosis Date Noted   Acute metabolic encephalopathy 08/13/2023   Microcytic anemia 08/13/2023   Essential hypertension 08/13/2023   Type 2 diabetes mellitus (HCC) 08/13/2023   UTI (urinary tract infection) 08/12/2023   Past Medical History:  Past Medical History:  Diagnosis Date   Diabetes mellitus without complication (HCC)    Hypertension    Night muscle spasms    Past Surgical History: History reviewed. No pertinent surgical history. HPI:  Shannon Barrett is a 68 y.o. female who has a past medical history of Diabetes mellitus without complication (HCC), Hypertension, and Night muscle spasms., who presents with confusion, visual, and auditory hallucinations.  She has been getting lost every time she leaves the house. Per neurology note there have been decline in memory and problem solving since Sept 2024. In the emergency department her UA showed large leukocytes with rare bacteria, he urine culture is in process.  Her UDS is also positive for benzodiazepines. Additionally she does have bedbug bites.Neurology NP suspects autoimmune echephalitis and will get LP.   Assessment / Plan / Recommendation Clinical Impression  Pt demonstrates decreased alertness, attention, dysarthria and cognitive impairments. She kept eyes closed but would briefly open on command. Majority of verbalizations in sentences were unintelligible marked by deceased vocal intensity and mostly munbling. Language appeared functional from what was intelligible. She was only oriented to name. She did not follow commands for oromotor commands but did wiggle her fingers and give a thumbs up. Per chart pt has had a significant cognitive decline since Sept and daughter managing  finances (per chart). Neurology NP present at end of session and suspecting autoimmune encephalitis and will perform LP. SLP will work with pt on basic cognition, speech intelligibility however she may not improve until her diagnosis can be determined and recommended medical interventions per MD. ST may be short in duration.    SLP Assessment  SLP Recommendation/Assessment: Patient needs continued Speech Lanaguage Pathology Services SLP Visit Diagnosis: Cognitive communication deficit (R41.841)    Recommendations for follow up therapy are one component of a multi-disciplinary discharge planning process, led by the attending physician.  Recommendations may be updated based on patient status, additional functional criteria and insurance authorization.    Follow Up Recommendations  Skilled nursing-short term rehab (<3 hours/day)    Assistance Recommended at Discharge  Frequent or constant Supervision/Assistance  Functional Status Assessment Patient has had a recent decline in their functional status and demonstrates the ability to make significant improvements in function in a reasonable and predictable amount of time.  Frequency and Duration min 2x/week  2 weeks      SLP Evaluation Cognition  Overall Cognitive Status: Impaired/Different from baseline (difficult to know how different from baseline -no family available.) Arousal/Alertness: Lethargic Orientation Level: Oriented to person;Disoriented to place;Disoriented to situation;Disoriented to time Attention: Sustained Sustained Attention: Impaired Sustained Attention Impairment: Verbal basic Memory:  (TBA) Awareness: Impaired Awareness Impairment: Emergent impairment Problem Solving: Impaired Problem Solving Impairment: Verbal basic Safety/Judgment: Impaired       Comprehension  Auditory Comprehension Overall Auditory Comprehension: Appears within functional limits for tasks assessed Commands: Within Functional Limits (followed  several one step commands) Interfering Components: Attention Visual Recognition/Discrimination Discrimination: Not tested Reading Comprehension Reading Status: Not tested    Expression Expression Primary Mode of Expression:  Verbal Verbal Expression Overall Verbal Expression: Appears within functional limits for tasks assessed Initiation: No impairment Level of Generative/Spontaneous Verbalization: Phrase Repetition:  (repeated 2 short phrases) Naming:  (no response) Pragmatics: Impairment Impairments: Eye contact Interfering Components: Attention Written Expression Written Expression: Not tested   Oral / Motor  Oral Motor/Sensory Function Overall Oral Motor/Sensory Function:  (did not follow commands, no asymmetry or weakness) Motor Speech Respiration: Within functional limits Phonation: Low vocal intensity Resonance: Within functional limits Intelligibility: Intelligibility reduced Word: 25-49% accurate Phrase: 0-24% accurate Motor Planning: Witnin functional limits Motor Speech Errors: Not applicable            [name] 08/15/2023, 9:39 AM

## 2023-08-15 NOTE — Evaluation (Signed)
Occupational Therapy Evaluation Patient Details Name: Shannon Barrett MRN: [id number] DOB: [birth year] Today's Date: 08/15/2023   History of Present Illness Pt is a 68 y/o female presenting on 1/24 with AMS, confusion, visual and auditory hallucinations. EEG negative. CT head, CXR negative. UA with presumed UTI, UDS positive for benzo. Limited MRI but negative.  Per daughter, rapid decline over the last 2 months.   PMH includes: DM, HTN, nigh muscle spasms.   Clinical Impression   Patient admitted for above and presents with problem list below.  She requires min guard for functional mobility and transfers, up to min assist to sequence Adls and for safety.  She is able to follow some simple commands but demonstrates difficulty communicating and has poor awareness to safety/deficits.  Oriented to her self only, but is unable to state her birthday or age. Pt unable to provide PLOF but does report "that's enough" when asked to engage in further therapy after mobility to bathroom.  Based on performance today, believe patient will best benefit from continued OT services acutely and after dc at inpatient setting with <3hrs/day to optimize independence, safety with ADLs and mobility.        If plan is discharge home, recommend the following: A little help with walking and/or transfers;A little help with bathing/dressing/bathroom;Assistance with cooking/housework;Direct supervision/assist for medications management;Direct supervision/assist for financial management;Assist for transportation;Help with stairs or ramp for entrance;Supervision due to cognitive status    Functional Status Assessment  Patient has had a recent decline in their functional status and demonstrates the ability to make significant improvements in function in a reasonable and predictable amount of time.  Equipment Recommendations  Tub/shower seat    Recommendations for Other Services       Precautions / Restrictions  Precautions Precautions: Fall Restrictions Weight Bearing Restrictions Per Provider Order: No      Mobility Bed Mobility Overal bed mobility: Needs Assistance Bed Mobility: Supine to Sit, Sit to Supine     Supine to sit: Contact guard, HOB elevated, Used rails Sit to supine: Contact guard assist, HOB elevated, Used rails        Transfers Overall transfer level: Needs assistance Equipment used: None Transfers: Sit to/from Stand Sit to Stand: Contact guard assist                  Balance Overall balance assessment: Needs assistance Sitting-balance support: Feet supported Sitting balance-Leahy Scale: Fair     Standing balance support: No upper extremity supported, During functional activity Standing balance-Leahy Scale: Fair Standing balance comment: CGA standing at sink for hand hygiene                           ADL either performed or assessed with clinical judgement   ADL Overall ADL's : Needs assistance/impaired     Grooming: Minimal assistance;Wash/dry hands;Standing           Upper Body Dressing : Sitting;Minimal assistance   Lower Body Dressing: Minimal assistance;Sit to/from stand   Toilet Transfer: Contact guard assist;Ambulation   Toileting- Architect and Hygiene: Contact guard assist;Sit to/from stand       Functional mobility during ADLs: Contact guard assist       Vision   Vision Assessment?: No apparent visual deficits Additional Comments: difficult to assess     Perception         Praxis         Pertinent Vitals/Pain Pain Assessment Pain Assessment: Faces Pain  Score: 0-No pain Pain Intervention(s): Monitored during session     Extremity/Trunk Assessment Upper Extremity Assessment Upper Extremity Assessment: Generalized weakness   Lower Extremity Assessment Lower Extremity Assessment: Defer to PT evaluation       Communication Communication Communication: Difficulty communicating  thoughts/reduced clarity of speech Cueing Techniques: Verbal cues;Gestural cues;Tactile cues;Visual cues   Cognition Arousal: Alert Behavior During Therapy: Flat affect Overall Cognitive Status: Difficult to assess Area of Impairment: Orientation, Attention, Memory, Safety/judgement, Awareness, Following commands, Problem solving                 Orientation Level: Disoriented to, Place, Time, Situation Current Attention Level: Focused Memory: Decreased short-term memory Following Commands: Follows one step commands inconsistently, Follows one step commands with increased time Safety/Judgement: Decreased awareness of safety, Decreased awareness of deficits Awareness: Intellectual Problem Solving: Slow processing, Decreased initiation, Difficulty sequencing, Requires verbal cues, Requires tactile cues General Comments: pt responds to her name and states 5 when asked her birthday, but otherwise disoriented.  Pt with nonsensical speech, able to follow some simple commands with increased time.     General Comments  pt somewhat impulsive, easily redirected but requires cues for safety    Exercises     Shoulder Instructions      Home Living Family/patient expects to be discharged to:: Private residence Living Arrangements: Alone   Type of Home: Apartment                           Additional Comments: Information from chart, pt unable to provide.  Lives With: Alone    Prior Functioning/Environment Prior Level of Function : Patient poor historian/Family not available               ADLs Comments: per chart pt with difficulty caring for herself but living alone        OT Problem List: Decreased strength;Decreased activity tolerance;Impaired balance (sitting and/or standing);Decreased safety awareness;Decreased knowledge of use of DME or AE;Decreased knowledge of precautions      OT Treatment/Interventions: Self-care/ADL training;Therapeutic exercise;DME  and/or AE instruction;Therapeutic activities;Cognitive remediation/compensation;Patient/family education;Balance training    OT Goals(Current goals can be found in the care plan section) Acute Rehab OT Goals Patient Stated Goal: none stated OT Goal Formulation: Patient unable to participate in goal setting Time For Goal Achievement: 08/29/23 Potential to Achieve Goals: Fair  OT Frequency: Min 1X/week    Co-evaluation PT/OT/SLP Co-Evaluation/Treatment: Yes Reason for Co-Treatment: Other (comment) (tolerance) PT goals addressed during session: Mobility/safety with mobility;Balance OT goals addressed during session: ADL's and self-care      AM-PAC OT "6 Clicks" Daily Activity     Outcome Measure Help from another person eating meals?: A Little Help from another person taking care of personal grooming?: A Little Help from another person toileting, which includes using toliet, bedpan, or urinal?: A Little Help from another person bathing (including washing, rinsing, drying)?: A Little Help from another person to put on and taking off regular upper body clothing?: A Little Help from another person to put on and taking off regular lower body clothing?: A Little 6 Click Score: 18   End of Session Nurse Communication: Mobility status  Activity Tolerance: Patient tolerated treatment well Patient left: in bed;with call bell/phone within reach;with bed alarm set;with nursing/sitter in room;with restraints reapplied  OT Visit Diagnosis: Other abnormalities of gait and mobility (R26.89);Muscle weakness (generalized) (M62.81);Other symptoms and signs involving cognitive function  Time: 1610-9604 OT Time Calculation (min): 19 min Charges:  OT General Charges $OT Visit: 1 Visit OT Evaluation $OT Eval Moderate Complexity: 1 Mod  Barry Brunner, OT Acute Rehabilitation Services Office [phone]   Chancy Milroy 08/15/2023, 2:40 PM

## 2023-08-15 NOTE — Consult Note (Signed)
Brief Psychiatry Consult Note  Consulted by Shannon Barrett for psychosis with agitation while pt at [facility]  Interim documentation by primary team and nursing staff has been reviewed. Dr. Jannifer Barrett attempted to see yesterday 1/27 with minimal success.   On examination today, pt with eyes open watching TV when I entered the room. She stated her name and then closed her eyes and refused to interact. When I attempted to wake her, stated "I was sleeping" and refused to interact further.   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 Discussed decrease in agitation - relieved to hear this but mostly concerned about cognitive decline. She has had brief moments (ie minutes) of lucidity since 04/20/24. Has been hyperverbal (talking about anything and everything). Knows her name is Shannon Barrett but not recognizing family. Talking about her mother (dead since 04/20/24) and sister (dead 31 years+). Her biggest concern is the lack of ability to bathe, eat, etc. No diagnosed psych hx per niece.   We will follow from Shannon distance at this time - will likely sign off in 2-3 days if no further episodes of agitation. This has been communicated to the primary team. If issues arise in the future, don't hesitate to reconsult the Psychiatry Inpatient Consult Service.   [id number]/low complexity  Shannon Barrett Shannon Barrett

## 2023-08-16 ENCOUNTER — Inpatient Hospital Stay (HOSPITAL_COMMUNITY): Payer: Medicare Other

## 2023-08-16 DIAGNOSIS — I10 Essential (primary) hypertension: Secondary | ICD-10-CM | POA: Diagnosis not present

## 2023-08-16 DIAGNOSIS — N3000 Acute cystitis without hematuria: Secondary | ICD-10-CM | POA: Diagnosis not present

## 2023-08-16 DIAGNOSIS — F0390 Unspecified dementia without behavioral disturbance: Secondary | ICD-10-CM | POA: Diagnosis not present

## 2023-08-16 DIAGNOSIS — G9341 Metabolic encephalopathy: Secondary | ICD-10-CM | POA: Diagnosis not present

## 2023-08-16 LAB — RENAL FUNCTION PANEL
Albumin: 3.3 g/dL — ABNORMAL LOW (ref 3.5–5.0)
Anion gap: 10 (ref 5–15)
BUN: 28 mg/dL — ABNORMAL HIGH (ref 8–23)
CO2: 22 mmol/L (ref 22–32)
Calcium: 9.1 mg/dL (ref 8.9–10.3)
Chloride: 109 mmol/L (ref 98–111)
Creatinine, Ser: 1.11 mg/dL — ABNORMAL HIGH (ref 0.44–1.00)
GFR, Estimated: 55 mL/min — ABNORMAL LOW (ref >60)
Glucose, Bld: 109 mg/dL — ABNORMAL HIGH (ref 70–99)
Phosphorus: 4.2 mg/dL (ref 2.5–4.6)
Potassium: 3.9 mmol/L (ref 3.5–5.1)
Sodium: 141 mmol/L (ref 135–145)

## 2023-08-16 LAB — CBC
HCT: 35.5 % — ABNORMAL LOW (ref 36.0–46.0)
Hemoglobin: 12.6 g/dL (ref 12.0–15.0)
MCH: 26.8 pg (ref 26.0–34.0)
MCHC: 35.5 g/dL (ref 30.0–36.0)
MCV: 75.5 fL — ABNORMAL LOW (ref 80.0–100.0)
Platelets: 281 10*3/uL (ref 150–400)
RBC: 4.7 MIL/uL (ref 3.87–5.11)
RDW: 14.2 % (ref 11.5–15.5)
WBC: 5.9 10*3/uL (ref 4.0–10.5)
nRBC: 0 % (ref 0.0–0.2)

## 2023-08-16 LAB — GLUCOSE, CAPILLARY
Glucose-Capillary: 101 mg/dL — ABNORMAL HIGH (ref 70–99)
Glucose-Capillary: 127 mg/dL — ABNORMAL HIGH (ref 70–99)
Glucose-Capillary: 141 mg/dL — ABNORMAL HIGH (ref 70–99)

## 2023-08-16 LAB — MAGNESIUM: Magnesium: 2.1 mg/dL (ref 1.7–2.4)

## 2023-08-16 LAB — MISC LABCORP TEST (SEND OUT): Labcorp test code: 505535

## 2023-08-16 LAB — VITAMIN A: Vitamin A (Retinoic Acid): 26.4 ug/dL (ref 22.0–69.5)

## 2023-08-16 MED ORDER — THIAMINE HCL 100 MG/ML IJ SOLN
500.0000 mg | Freq: Three times a day (TID) | INTRAVENOUS | Status: AC
  Administered 2023-08-16 – 2023-08-17 (×3): 500 mg via INTRAVENOUS
  Filled 2023-08-16 (×5): qty 5

## 2023-08-16 MED ORDER — THIAMINE HCL 100 MG/ML IJ SOLN
250.0000 mg | Freq: Three times a day (TID) | INTRAVENOUS | Status: AC
  Administered 2023-08-18 (×2): 250 mg via INTRAVENOUS
  Filled 2023-08-16 (×4): qty 2.5

## 2023-08-16 MED ORDER — IOHEXOL 350 MG/ML SOLN
75.0000 mL | Freq: Once | INTRAVENOUS | Status: AC | PRN
  Administered 2023-08-16: 75 mL via INTRAVENOUS

## 2023-08-16 MED ORDER — HALOPERIDOL LACTATE 5 MG/ML IJ SOLN
1.0000 mg | Freq: Once | INTRAMUSCULAR | Status: AC
  Administered 2023-08-19: 1 mg via INTRAVENOUS

## 2023-08-16 NOTE — Social Work (Signed)
CSW acknowledging recommendations for SNF at discharge. Pt currently with ongoing barriers including sitter, restraints, and ongoing medical workup. CSW will assess at a more appropriate time. TOC will continue to follow for any additional needs.

## 2023-08-16 NOTE — Plan of Care (Signed)
  Problem: Health Behavior/Discharge Planning: Goal: Ability to manage health-related needs will improve Outcome: Progressing   Problem: Clinical Measurements: Goal: Ability to maintain clinical measurements within normal limits will improve Outcome: Progressing Goal: Will remain free from infection Outcome: Progressing Goal: Diagnostic test results will improve Outcome: Progressing   Problem: Activity: Goal: Risk for activity intolerance will decrease Outcome: Progressing   Problem: Skin Integrity: Goal: Risk for impaired skin integrity will decrease Outcome: Progressing   Problem: Education: Goal: Ability to describe self-care measures that may prevent or decrease complications (Diabetes Survival Skills Education) will improve Outcome: Progressing Goal: Individualized Educational Video(s) Outcome: Progressing   Problem: Coping: Goal: Ability to adjust to condition or change in health will improve Outcome: Progressing   Problem: Fluid Volume: Goal: Ability to maintain a balanced intake and output will improve Outcome: Progressing   Problem: Metabolic: Goal: Ability to maintain appropriate glucose levels will improve Outcome: Progressing   Problem: Nutritional: Goal: Maintenance of adequate nutrition will improve Outcome: Progressing Goal: Progress toward achieving an optimal weight will improve Outcome: Progressing   Problem: Skin Integrity: Goal: Risk for impaired skin integrity will decrease Outcome: Progressing   Problem: Tissue Perfusion: Goal: Adequacy of tissue perfusion will improve Outcome: Progressing

## 2023-08-16 NOTE — Progress Notes (Addendum)
PROGRESS NOTE  Shannon Barrett ZOX:[id number] DOB: 30-Nov-1955   PCP: Randell Patient [facility]  Patient is from: Home  DOA: 08/12/2023 LOS: 4  Chief complaints Chief Complaint  Patient presents with   Altered Mental Status     Brief Narrative / Interim history: 68 y.o. F with PMH of DM-2 and HTN brought to ED due to confusion, visual and auditory hallucination.  Reportedly with known bedbugs and bites all over.  Per POA report to triage nurse, patient was not showering or taking her medications.  Patient does confused and only oriented to self on presentation.  In ED, slightly hypertensive.  Basic labs without significant finding.  UA with large LE and rare bacteria.  UDS positive for benzo.  Does not seem to prescription for benzo.  CT head and CXR without significant finding.  Patient was given IM Geodon 10 mg x 1, IM Haldol 2 mg x 1, Benadryl 25 mg p.o. x 1 and IV ceftriaxone.  Admission requested for acute encephalopathy in the setting of presumed UTI.   Patient continued to be confused and agitated at times.  Per discussion with patient's daughter, rapid cognitive decline over the last 2 months.  Neurology consulted and recommended MRI brain with and transferred to [facility] for further workup.  EEG negative for seizure or epilepsy.  MRI brain with/without contrast attempted but only diffusion sequence obtained and is reassuring.  Neurology following and planning LP for CSF studies, CT chest/abdomen/pelvis to rule out occult malignancy and labs for nutritional deficiencies.  Subjective: Seen and examined earlier this morning.  No major events overnight of this morning.  She is sleeping but wakes to voice.  She is oriented to self.  She thinks she is in [place].  Objective: Vitals:   08/16/23 0318 08/16/23 0815 08/16/23 1009 08/16/23 1129  BP: 133/77 (!) 129/56 (!) 129/56 (!) 144/64  Pulse: 80 72  75  Resp: 18 16  18   Temp: (!) 97.4 F (36.3 C) 98 F (36.7 C)  97.8 F  (36.6 C)  TempSrc: Oral Axillary  Oral  SpO2: 100% 100%  100%  Weight:      Height:        Examination:  GENERAL: No apparent distress.  Nontoxic. HEENT: MMM.  Vision and hearing grossly intact.  NECK: Supple.  No apparent JVD.  RESP:  No IWOB.  Fair aeration bilaterally. CVS:  RRR. Heart sounds normal.  ABD/GI/GU: BS+. Abd soft, NTND.  MSK/EXT:  Moves extremities. No apparent deformity. No edema.  Bilateral wrist restraints. SKIN: no apparent skin lesion or wound NEURO: Sleepy but wakes to voice.  Oriented to self.  Thinks she is in [place].  Follows commands.  No apparent focal neuro deficit. PSYCH: Calm. Normal affect.   Procedures:  None  Microbiology summarized: Urine culture pending  Assessment and plan: Confusion/agitation/audiovisual hallucination: Suspect undiagnosed major cognitive impairment with rapid decline.  Per daughter, no formal diagnosis of dementia but he does not see provider much.  Daughter noted some cognitive decline over the last 1 year that has progressed quickly in the last 2 months.  She is confused without a visual hallucination, sundowning, wandering and self-neglect.  At times, she does not even recognize her daughter.  He does not take her regular meds.  Per daughter, does not have regular PCP but goes to health department times. UDS positive for benzodiazepines without prescription.  UA with pyuria but she does not have fever or leukocytosis.  No suprapubic tenderness either.  CT head without significant finding.  RPR, CRP, HIV, TSH, folate and B12 within normal.  EEG with mild diffuse encephalopathy but no seizure or epileptiform discharge.  MRI brain as above.  Patient is currently sleepy but wakes to voice.  She is oriented to self and follows command.  No focal neurodeficit.  Still confused with intermittent agitation. -Appreciate help by neurology -LP for CSF studies,  -CT chest/abdomen/pelvis to rule out occult malignancy -Labs for nutritional  deficiencies. -Antibiotics for UTI.  Urine culture with multiple species.  Completed 4 days of ceftriaxone -Vitamin B12 supplementation -Delirium precaution -Appreciate psychiatry input-continue low-dose Zyprexa.  IV Haldol as needed.  QTc 426. -Continue one-to-one safety sitter and soft restraints -Trial of high-dose thiamine.  Pyuria: Unclear if this is UTI or just pyuria.  She has no fever, leukocytosis or suprapubic tenderness.  Urine culture with multiple species.  Received ceftriaxone from 1/24-1/27.  AKI: Likely prerenal from poor p.o. intake, lisinopril and HCTZ.  AKI improved. Recent Labs    08/12/23 1839 08/13/23 0622 08/14/23 0610 08/15/23 0601 08/16/23 0643  BUN 14 14 12  27* 28*  CREATININE 0.89 0.81 1.11* 1.36* 1.11*  -Lisinopril/HCTZ discontinued on 1/27 -Recheck renal function in the morning.   NIDDM-2: On metformin at home but does not take.  A1c 6.2%. Recent Labs  Lab 08/15/23 0647 08/15/23 1216 08/15/23 1653 08/16/23 0808 08/16/23 1113  GLUCAP 114* 92 124* 101* 127*  -Continue SSI-sensitive.  Essential hypertension: BP elevated but improved. -Discontinued lisinopril and HCTZ -Continue amlodipine 10 mg daily -P.o. hydralazine as needed  Microcytic anemia: Stable.  Anemia panel with mild iron deficiency.  B12 305. Recent Labs    08/12/23 1839 08/13/23 0622 08/14/23 0610 08/15/23 0601 08/16/23 0643  HGB 12.4 11.9* 13.3 13.3 12.6  -B12 supplementation as above -Continue monitoring  Physical deconditioning -PT/OT recommended SNF.  Advance care planning: Discussed CODE STATUS with patient's daughter.  Patient is full code.  Body mass index is 26.17 kg/m.           DVT prophylaxis:  SCDs Start: 08/13/23 0543  Code Status: Full code Family Communication: None at bedside today. Level of care: Med-Surg Status is: Inpatient Remains inpatient appropriate because: Agitation/psychosis/confusion and AKI.   Final disposition:  SNF Consultants:  Neurology Psychiatry  55 minutes with more than 50% spent in reviewing records, counseling patient/family and coordinating care.   Sch Meds:  Scheduled Meds:  amLODipine  10 mg Oral Daily   cyanocobalamin  1,000 mcg Intramuscular Daily   Followed by   Melene Muller ON 08/17/2023] vitamin B-12  1,000 mcg Oral Daily   haloperidol lactate  1 mg Intravenous Once   insulin aspart  0-9 Units Subcutaneous TID WC   OLANZapine  2.5 mg Oral q AM   And   OLANZapine  5 mg Oral QHS   Continuous Infusions:   PRN Meds:.acetaminophen **OR** acetaminophen, haloperidol lactate **OR** haloperidol lactate, hydrALAZINE, ondansetron **OR** ondansetron (ZOFRAN) IV  Antimicrobials: Anti-infectives (From admission, onward)    Start     Dose/Rate Route Frequency Ordered Stop   08/13/23 1000  cefTRIAXone (ROCEPHIN) 1 g in sodium chloride 0.9 % 100 mL IVPB  Status:  Discontinued        1 g 200 mL/hr over 30 Minutes Intravenous Every 24 hours 08/13/23 0530 08/15/23 1424   08/12/23 2300  cefTRIAXone (ROCEPHIN) 2 g in sodium chloride 0.9 % 100 mL IVPB        2 g 200 mL/hr over 30 Minutes Intravenous  Once  08/12/23 2245 08/13/23 0018        I have personally reviewed the following labs and images: CBC: Recent Labs  Lab 08/12/23 1839 08/13/23 0622 08/14/23 0610 08/15/23 0601 08/16/23 0643  WBC 5.8 5.0 5.9 7.6 5.9  NEUTROABS 2.7  --   --   --   --   HGB 12.4 11.9* 13.3 13.3 12.6  HCT 35.9* 35.7* 37.7 38.2 35.5*  MCV 78.2* 78.8* 76.0* 76.6* 75.5*  PLT 272 262 298 313 281   BMP &GFR Recent Labs  Lab 08/12/23 1839 08/13/23 0622 08/14/23 0610 08/15/23 0601 08/16/23 0643  NA 138 139 138 137 141  K 3.9 3.6 4.0 3.6 3.9  CL 105 108 105 103 109  CO2 25 23 23 23 22   GLUCOSE 108* 103* 105* 121* 109*  BUN 14 14 12  27* 28*  CREATININE 0.89 0.81 1.11* 1.36* 1.11*  CALCIUM 9.3 9.2 9.3 9.5 9.1  MG  --  2.1 2.0 2.3 2.1  PHOS  --  3.3 4.1 4.1 4.2   Estimated Creatinine Clearance:  44.1 mL/min (A) (by C-G formula based on SCr of 1.11 mg/dL (H)). Liver & Pancreas: Recent Labs  Lab 08/12/23 1839 08/13/23 0622 08/14/23 0610 08/15/23 0601 08/16/23 0643  AST 28 25  --   --   --   ALT 18 17  --   --   --   ALKPHOS 66 66  --   --   --   BILITOT 0.7 0.2  --   --   --   PROT 8.5* 7.8  --   --   --   ALBUMIN 3.8 3.7 3.5 3.5 3.3*   No results for input(s): "LIPASE", "AMYLASE" in the last 168 hours. No results for input(s): "AMMONIA" in the last 168 hours. Diabetic: No results for input(s): "HGBA1C" in the last 72 hours.  Recent Labs  Lab 08/15/23 0647 08/15/23 1216 08/15/23 1653 08/16/23 0808 08/16/23 1113  GLUCAP 114* 92 124* 101* 127*   Cardiac Enzymes: No results for input(s): "CKTOTAL", "CKMB", "CKMBINDEX", "TROPONINI" in the last 168 hours. No results for input(s): "PROBNP" in the last 8760 hours. Coagulation Profile: No results for input(s): "INR", "PROTIME" in the last 168 hours. Thyroid Function Tests: No results for input(s): "TSH", "T4TOTAL", "FREET4", "T3FREE", "THYROIDAB" in the last 72 hours.  Lipid Profile: No results for input(s): "CHOL", "HDL", "LDLCALC", "TRIG", "CHOLHDL", "LDLDIRECT" in the last 72 hours. Anemia Panel: Recent Labs    08/13/23 1911 08/14/23 1110  FOLATE 15.8 15.4   Urine analysis:    Component Value Date/Time   COLORURINE YELLOW 08/12/2023 1752   APPEARANCEUR HAZY (A) 08/12/2023 1752   LABSPEC 1.021 08/12/2023 1752   PHURINE 6.0 08/12/2023 1752   GLUCOSEU NEGATIVE 08/12/2023 1752   HGBUR NEGATIVE 08/12/2023 1752   BILIRUBINUR NEGATIVE 08/12/2023 1752   KETONESUR NEGATIVE 08/12/2023 1752   PROTEINUR 30 (A) 08/12/2023 1752   NITRITE NEGATIVE 08/12/2023 1752   LEUKOCYTESUR LARGE (A) 08/12/2023 1752   Sepsis Labs: Invalid input(s): "PROCALCITONIN", "LACTICIDVEN"  Microbiology: Recent Results (from the past 240 hours)  Urine Culture     Status: Abnormal   Collection Time: 08/12/23  5:52 PM   Specimen: Urine,  Clean Catch  Result Value Ref Range Status   Specimen Description   Final    URINE, CLEAN CATCH Performed at [facility], [address], [place], [place] [address]    Special Requests   Final    NONE Performed at [facility], [address], [place], [place] [address]    Culture MULTIPLE SPECIES PRESENT, SUGGEST RECOLLECTION (A)  Final   Report Status 08/15/2023 FINAL  Final    Radiology Studies: No results found.     Aceson Labell T. Tamotsu Wiederholt [facility]  If 7PM-7AM, please contact night-coverage [email or web address] 08/16/2023, 4:04 PM

## 2023-08-16 NOTE — Progress Notes (Signed)
NEUROLOGY CONSULT FOLLOW UP NOTE   Date of service: August 16, 2023 Patient Name: Shannon Barrett MRN:  [id number] DOB:  01-25-1956  Interval Hx/subjective  -Patient has remained hemodynamically stable and afebrile.  Lumbar puncture unable to be performed  today as she received lovenox last night- now discontinued. Drowsy this morning, states her name.  Current receiving zyprexa 2.5mg  AM and 5mg  HS and haldol PRN  \itals   Vitals:   08/15/23 1646 08/15/23 2028 08/16/23 0318 08/16/23 0815  BP: 109/80 (!) 152/71 133/77 (!) 129/56  Pulse: 93 89 80 72  Resp:  18 18 16   Temp: 98.4 F (36.9 C) 98.8 F (37.1 C) (!) 97.4 F (36.3 C) 98 F (36.7 C)  TempSrc: Oral Oral Oral Axillary  SpO2: 98% 100% 100% 100%  Weight:      Height:        Body mass index is 26.17 kg/m.  Physical Exam   Constitutional: Well-nourished, well-developed African American female laying in hospital bed, in no acute distress Psych: Patient appears pleasantly confused but is calm throughout assessment without agitation.  Eyes: No scleral injection.  HENT: No OP obstrucion.  Head: Normocephalic.  Respiratory: Effort normal, non-labored breathing on room air.    Neurologic Examination  Mental Status: Patient awake alert, states name. Unable to state birthday.  Cranial Nerves: II:  PERRL. Does not participate in formal visual testing.  III,IV, VI: Does not focus or track examiner V: Reacts to light touch on bilateral face VII: Face is symmetric resting and with movement VIII: Hearing is intact to voice X: Does not allow for visualization of soft palate XI: Head moves equally to the left and right. XII: Noncooperative with tongue protrusion Motor: Tone is normal. Bulk is normal.  Able to move all 4 extremities with antigravity strength, symmetrically Sensory: Reacts to light touch throughout.  Cerebellar: Patient does not perform  Medications  Current Facility-Administered Medications:     acetaminophen (TYLENOL) tablet 650 mg, 650 mg, Oral, Q6H PRN **OR** acetaminophen (TYLENOL) suppository 650 mg, 650 mg, Rectal, Q6H PRN, Adefeso, Oladapo, DO   amLODipine (NORVASC) tablet 10 mg, 10 mg, Oral, Daily, Alanda Slim, Taye T, MD, 10 mg at 08/14/23 1114   cyanocobalamin (VITAMIN B12) injection 1,000 mcg, 1,000 mcg, Intramuscular, Daily, 1,000 mcg at 08/15/23 0943 **FOLLOWED BY** [START ON 08/17/2023] cyanocobalamin (VITAMIN B12) tablet 1,000 mcg, 1,000 mcg, Oral, Daily, Gonfa, Taye T, MD   haloperidol lactate (HALDOL) injection 1 mg, 1 mg, Intravenous, Once, Luiz Iron, NP   haloperidol lactate (HALDOL) injection 2 mg, 2 mg, Intravenous, Q6H PRN, 2 mg at 08/16/23 0056 **OR** haloperidol lactate (HALDOL) injection 2 mg, 2 mg, Intramuscular, Q6H PRN, Alanda Slim, Taye T, MD, 2 mg at 08/15/23 1750   hydrALAZINE (APRESOLINE) injection 10 mg, 10 mg, Intravenous, Q6H PRN, Adefeso, Oladapo, DO   insulin aspart (novoLOG) injection 0-9 Units, 0-9 Units, Subcutaneous, TID WC, Adefeso, Oladapo, DO, 1 Units at 08/15/23 1700   OLANZapine (ZYPREXA) tablet 2.5 mg, 2.5 mg, Oral, q AM, 2.5 mg at 08/15/23 0635 **AND** OLANZapine (ZYPREXA) tablet 5 mg, 5 mg, Oral, QHS, Gonfa, Taye T, MD, 5 mg at 08/15/23 2007   ondansetron (ZOFRAN) tablet 4 mg, 4 mg, Oral, Q6H PRN **OR** ondansetron (ZOFRAN) injection 4 mg, 4 mg, Intravenous, Q6H PRN, Adefeso, Oladapo, DO  Labs and Diagnostic Imaging   CBC:  Recent Labs  Lab 08/12/23 1839 08/13/23 0622 08/15/23 0601 08/16/23 0643  WBC 5.8   < > 7.6 5.9  NEUTROABS 2.7  --   --   --  HGB 12.4   < > 13.3 12.6  HCT 35.9*   < > 38.2 35.5*  MCV 78.2*   < > 76.6* 75.5*  PLT 272   < > 313 281   < > = values in this interval not displayed.   Basic Metabolic Panel:  Lab Results  Component Value Date   NA 141 08/16/2023   K 3.9 08/16/2023   CO2 22 08/16/2023   GLUCOSE 109 (H) 08/16/2023   BUN 28 (H) 08/16/2023   CREATININE 1.11 (H) 08/16/2023   CALCIUM 9.1 08/16/2023    GFRNONAA 55 (L) 08/16/2023   HgbA1c:  Lab Results  Component Value Date   HGBA1C 6.2 (H) 08/13/2023   Urine Drug Screen:     Component Value Date/Time   LABOPIA NONE DETECTED 08/12/2023 1752   COCAINSCRNUR NONE DETECTED 08/12/2023 1752   LABBENZ POSITIVE (A) 08/12/2023 1752   AMPHETMU NONE DETECTED 08/12/2023 1752   THCU NONE DETECTED 08/12/2023 1752   LABBARB NONE DETECTED 08/12/2023 1752    Alcohol Level     Component Value Date/Time   [facility] <10 08/12/2023 1839   Lab Results  Component Value Date   VITAMINB12 305 08/13/2023   Last vitamin D Lab Results  Component Value Date   VD25OH 22.97 (L) 08/14/2023  Folate 15.4 Lab Results  Component Value Date   ESRSEDRATE 37 (H) 08/13/2023   Lab Results  Component Value Date   TSH 3.854 08/13/2023   Lab Results  Component Value Date   CRP 0.7 08/13/2023  RPR Non reactive Thiamine pending Vitamin A pending Folate 15.4 B6 pending [facility] Autoimmune Encephalopathy Panel (serum)  CT Head without contrast 08/12/23 (Personally reviewed): No CT evidence for acute intracranial abnormality. Mild atrophy.   MRI Brain: Motion limited DWI images obtained with no signs of acute or subacute infarct  rEEG:  "This study is suggestive of mild diffuse encephalopathy. No seizures or epileptiform discharges were seen throughout the recording. "  Assessment   Shannon Barrett is a 68 y.o. female with a PMHx of HTN and DM2 presenting to the ED 08/12/23 for evaluation of confusion, visual and auditory hallucinations with progressive functional and cognitive decline since September of 2024 initially with redirectable confusion progressing to hallucinations, self-neglect, sundowning, and patient wondering off over the last 1-2 months.  Initial workup revealed patient with multiple bedbug bites and urine with findings consistent with a pyuria versus UTI and she was started on antibiotics pending a urine culture.  Patient was transferred to [facility] for  further neurology evaluation of her cognitive decline.  Given the rapid cognitive decline, there is concern for an autoimmune encephalitis process therefore will complete EEG, MRI, lumbar puncture for CSF diagnostics.  Of note, UDS was positive for benzodiazepines, which were not prescribed for patient.  Upon further discussion with family, patient was noted to have memory and cognitive issues since September.  She first had episodes of confusion but was redirectable.  She was noted to have stopped caring for herself or cleaning her home, which was out of character for patient.  Recently, family had been informed by patient's landlord that patient had been walking outside in cold weather without shoes or appropriate clothing.   Impression: Cognitive decline-rather rapid from information obtained by family.  Awaiting autoimmune encephalitis workup.  Cannot rule out an underlying primary psychiatric component.  Recommendations  - MRI brain with and without contrast attempted, only diffusion sequences obtained which were reassuring   -Will need to reattempt to with  more sedation - LP with CSF autoimmune encephalitis panel, cell count with differential, glucose, protein, gram stain, and culture requested under fluoroscopy  - CT chest/abdomen/pelvis for occult malignancy screening - Continue evaluation and treatment of infectious process - Follow up on labs: B6, thiamine, Vitamin A - Delirium precautions  -If all the above workup remains negative, she will need psychiatry evaluation. - Neurology will continue to follow ______________________________________________________________________  Patient seen and examined by NP/APP with MD. MD to update note as needed.   Elmer Picker, DNP, FNP-BC [facility] Pager: [phone]    Attending Neurohospitalist Addendum Patient seen and examined with APP/Resident. Agree with the history and physical as documented above. Agree with the plan  as documented, which I helped formulate. I have independently reviewed the chart, obtained history, review of systems and examined the patient.I have personally reviewed pertinent head/neck/spine imaging (CT/MRI). Please feel free to call with any questions.  -- Milon Dikes, MD Neurologist [facility] Pager: [phone]

## 2023-08-17 ENCOUNTER — Inpatient Hospital Stay (HOSPITAL_COMMUNITY): Payer: Medicare Other

## 2023-08-17 DIAGNOSIS — F0390 Unspecified dementia without behavioral disturbance: Secondary | ICD-10-CM | POA: Diagnosis not present

## 2023-08-17 DIAGNOSIS — I10 Essential (primary) hypertension: Secondary | ICD-10-CM | POA: Diagnosis not present

## 2023-08-17 DIAGNOSIS — R410 Disorientation, unspecified: Secondary | ICD-10-CM | POA: Diagnosis not present

## 2023-08-17 DIAGNOSIS — G9341 Metabolic encephalopathy: Secondary | ICD-10-CM | POA: Diagnosis not present

## 2023-08-17 DIAGNOSIS — N3000 Acute cystitis without hematuria: Secondary | ICD-10-CM | POA: Diagnosis not present

## 2023-08-17 LAB — RENAL FUNCTION PANEL
Albumin: 3.3 g/dL — ABNORMAL LOW (ref 3.5–5.0)
Anion gap: 10 (ref 5–15)
BUN: 29 mg/dL — ABNORMAL HIGH (ref 8–23)
CO2: 24 mmol/L (ref 22–32)
Calcium: 9.2 mg/dL (ref 8.9–10.3)
Chloride: 103 mmol/L (ref 98–111)
Creatinine, Ser: 1.23 mg/dL — ABNORMAL HIGH (ref 0.44–1.00)
GFR, Estimated: 48 mL/min — ABNORMAL LOW (ref >60)
Glucose, Bld: 128 mg/dL — ABNORMAL HIGH (ref 70–99)
Phosphorus: 3.7 mg/dL (ref 2.5–4.6)
Potassium: 4.4 mmol/L (ref 3.5–5.1)
Sodium: 137 mmol/L (ref 135–145)

## 2023-08-17 LAB — GLUCOSE, CAPILLARY
Glucose-Capillary: 130 mg/dL — ABNORMAL HIGH (ref 70–99)
Glucose-Capillary: 159 mg/dL — ABNORMAL HIGH (ref 70–99)
Glucose-Capillary: 108 mg/dL — ABNORMAL HIGH (ref 70–99)
Glucose-Capillary: 186 mg/dL — ABNORMAL HIGH (ref 70–99)

## 2023-08-17 LAB — CBC
HCT: 36.9 % (ref 36.0–46.0)
Hemoglobin: 12.9 g/dL (ref 12.0–15.0)
MCH: 26.8 pg (ref 26.0–34.0)
MCHC: 35 g/dL (ref 30.0–36.0)
MCV: 76.6 fL — ABNORMAL LOW (ref 80.0–100.0)
Platelets: 274 10*3/uL (ref 150–400)
RBC: 4.82 MIL/uL (ref 3.87–5.11)
RDW: 14.5 % (ref 11.5–15.5)
WBC: 7.1 10*3/uL (ref 4.0–10.5)
nRBC: 0 % (ref 0.0–0.2)

## 2023-08-17 LAB — VITAMIN B6: Vitamin B6: 8.5 ug/L (ref 3.4–65.2)

## 2023-08-17 LAB — MAGNESIUM: Magnesium: 2.2 mg/dL (ref 1.7–2.4)

## 2023-08-17 MED ORDER — VITAMIN D 25 MCG (1000 UNIT) PO TABS
2000.0000 [IU] | ORAL_TABLET | Freq: Every day | ORAL | Status: DC
  Administered 2023-08-17 – 2023-09-10 (×22): 2000 [IU] via ORAL
  Filled 2023-08-17 (×25): qty 2

## 2023-08-17 MED ORDER — LACTATED RINGERS IV BOLUS
1000.0000 mL | Freq: Once | INTRAVENOUS | Status: AC
  Administered 2023-08-17: 1000 mL via INTRAVENOUS

## 2023-08-17 MED ORDER — ORAL CARE MOUTH RINSE: 15.0000 mL | OROMUCOSAL | Status: DC | PRN

## 2023-08-17 MED ORDER — ENSURE ENLIVE PO LIQD
237.0000 mL | Freq: Two times a day (BID) | ORAL | Status: DC
  Administered 2023-08-18 – 2023-09-08 (×34): 237 mL via ORAL

## 2023-08-17 NOTE — Procedures (Signed)
Lumbar Puncture Procedure Note Date: 08/17/2023 Time: 1343 Indication:  Altered mental status  Proceduralist: Elmer Picker FNP-BC, Milon Dikes MD  Consent obtained from [facility] A time-out was completed verifying correct patient, procedure, site, positioning, and special equipment if applicable. The procedure was described to the family via telephone. All the indications and potential side effects of the procedure were discussed in details (including but not limited to risk of bleeding, infection, nerve injury and post LP headache). HCPOA was understanding, agreeable and all questions were answered.  The patient was placed in the sitting position. The area was cleansed and draped in usual sterile fashion. 1% lidocaine was used anesthetize the surrounding skin area. A 3.5-inch spinal needle was placed in the L3-L4 interspace.  Procedure was unsuccessful-no fluid return after multiple attempts, LP under fluoroscopy requested.   Estimated Blood Loss: 1ml.  Patient was counseled about post-LP headache and how to minimize it. He was advised to report any persistent headache, low back pain, leg tingling, numbness or weakness.  Elmer Picker, DNP, FNP-BC [facility] Pager: [phone]

## 2023-08-17 NOTE — Consult Note (Signed)
Brief Psychiatry Consult Note  Per Dr. Gasper Sells on 1/28:  Consulted by Shon Hale for psychosis with agitation while pt at [facility].  Interim documentation by primary team and nursing staff has been reviewed. Dr. Jannifer Franklin attempted to see yesterday 1/27 with minimal success.   On examination today, pt with eyes open watching TV when I entered the room. She stated her name and then closed her eyes and refused to interact. When I attempted to wake her, stated "I was sleeping" and refused to interact further.   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 Discussed decrease in agitation - relieved to hear this but mostly concerned about cognitive decline. She has had brief moments (ie minutes) of lucidity since 2024/03/30. Has been hyperverbal (talking about anything and everything). Knows her name is Naomy but not recognizing family. Talking about her mother (dead since 2024/03/30) and sister (dead 18 years+). Her biggest concern is the lack of ability to bathe, eat, etc. No diagnosed psych hx per niece.   We will follow from a distance at this time - will likely sign off in 2-3 days if no further episodes of agitation. This has been communicated to the primary team. If issues arise in the future, don't hesitate to reconsult the Psychiatry Inpatient Consult Service.   99253/low complexity  08/16/23: On assessment today, the client was pleasantly confused and rambling frequently.  She did respond to her name and reported she lives with her parents, quickly corrected herself with her father is deceased.  However, her mother passed away in 03/30/2024. Denied depression, unclear if she comprehended the question.  She was difficult to understand at times.  At one point, she made a hand gesture to her left side when discussing her mother.   When asked if she was present, she said she was in the garbage can that she was pointing at.  The sitter was oncoming to her shift with no concerns about her behavior reported to her.  There does not appear to be any negative side effects from the Zyprexa that was started and appears to be working for her agitation.  During the assessment, she was pleasant and not trying to get out of bed, posey belt and hand restraints in place.  Psych will sign-off as we unfortunately cannot fix her confusion and appears to be positive responding to the Zyprexa.  Memory care would be a nice placement option for her.  Nanine Means, PMHNP

## 2023-08-17 NOTE — Progress Notes (Signed)
PROGRESS NOTE  Shannon Barrett ZOX:[id number] DOB: [birth year]   PCP: Randell Patient [facility]  Patient is from: Home  DOA: 08/12/2023 LOS: 5  Chief complaints Chief Complaint  Patient presents with   Altered Mental Status     Brief Narrative / Interim history: 68 y.o. F with PMH of DM-2 and HTN brought to ED due to confusion, visual and auditory hallucination.  Reportedly with known bedbugs and bites all over.  Per POA report to triage nurse, patient was not showering or taking her medications.  Patient does confused and only oriented to self on presentation.  In ED, slightly hypertensive.  Basic labs without significant finding.  UA with large LE and rare bacteria.  UDS positive for benzo.  Does not seem to prescription for benzo.  CT head and CXR without significant finding.  Patient was given IM Geodon 10 mg x 1, IM Haldol 2 mg x 1, Benadryl 25 mg p.o. x 1 and IV ceftriaxone.  Admission requested for acute encephalopathy in the setting of presumed UTI.   Patient continued to be confused and agitated at times.  Per discussion with patient's daughter, rapid cognitive decline over the last 2 months.  Neurology consulted and recommended MRI brain with and transferred to [facility] for further workup.  EEG negative for seizure or epilepsy.  MRI brain with/without contrast attempted but only diffusion sequence obtained and is reassuring.  CT chest/abdomen/pelvis did not show malignancy.  Bedside LP unsuccessful.  Fluoroscopy guided LP requested by neurology.  Subjective: Seen and examined earlier this morning.  No major events overnight of this morning.  She reported abdominal pain but not a great historian.  She is awake and alert but only oriented to self.  She does not appear to be in pain or distress.   Objective: Vitals:   08/16/23 2004 08/17/23 0020 08/17/23 0435 08/17/23 0940  BP:  (!) 136/96 123/60 128/64  Pulse: (!) 109 74 60 90  Resp:  16 17 17   Temp: (!) 97.5 F (36.4  C) 98.3 F (36.8 C) 97.8 F (36.6 C) 98.1 F (36.7 C)  TempSrc: Axillary Oral Oral   SpO2: 98% 100% 100% 99%  Weight:      Height:        Examination:  GENERAL: No apparent distress.  Nontoxic. HEENT: MMM.  Vision and hearing grossly intact.  NECK: Supple.  No apparent JVD.  RESP:  No IWOB.  Fair aeration bilaterally. CVS:  RRR. Heart sounds normal.  ABD/GI/GU: BS+. Abd soft, NTND.  MSK/EXT:  Moves extremities. No apparent deformity. No edema.  Bilateral wrist restraints. SKIN: no apparent skin lesion or wound NEURO: Sleepy but wakes to voice.  Oriented to self.  Follows commands.  No apparent focal neuro deficit. PSYCH: Calm. Normal affect.   Procedures:  None  Microbiology summarized: Urine culture pending  Assessment and plan: Confusion/agitation/audiovisual hallucination: Suspect undiagnosed major cognitive impairment with rapid decline.  Per daughter, no formal diagnosis of dementia but he does not see provider much.  Daughter noted some cognitive decline over the last 1 year that has progressed quickly in the last 2 months.  She is confused without a visual hallucination, sundowning, wandering and self-neglect.  At times, she does not even recognize her daughter.  He does not take her regular meds.  Per daughter, does not have regular PCP but goes to health department times. UDS positive for benzodiazepines without prescription.  UA with pyuria but she does not have fever or leukocytosis.  No suprapubic tenderness either.  CT head without significant finding.  RPR, CRP, HIV, TSH, folate and B12 within normal.  EEG with mild diffuse encephalopathy but no seizure or epileptiform discharge.  MRI brain as above.  Patient is currently sleepy but wakes to voice.  She is oriented to self and follows command.  No focal neurodeficit.  Still confused with intermittent agitation. -Appreciate help by neurology -LP for CSF studies.  Bedside LP unsuccessful.  Fluoro-guided LMP requested -CT  chest/abdomen/pelvis to rule out occult malignancy-negative. -Labs for nutritional deficiencies. -Antibiotics for UTI.  Urine culture with multiple species.  Completed 4 days of ceftriaxone -Vitamin B12, D and B1 supplementation -Delirium precaution -Appreciate psychiatry input-continue low-dose Zyprexa.  IV Haldol as needed.  QTc 426. -Continue one-to-one safety sitter and soft restraints  Pyuria: Unclear if this is UTI or just pyuria.  She has no fever, leukocytosis or suprapubic tenderness.  Urine culture with multiple species.  Received ceftriaxone from 1/24-1/27.  AKI: Likely prerenal from poor p.o. intake, lisinopril and HCTZ.  AKI improved. Recent Labs    08/12/23 1839 08/13/23 0622 08/14/23 0610 08/15/23 0601 08/16/23 0643 08/17/23 0737  BUN 14 14 12  27* 28* 29*  CREATININE 0.89 0.81 1.11* 1.36* 1.11* 1.23*  -Lisinopril/HCTZ discontinued on 1/27 -Recheck renal function in the morning. -Ensure hydration.  Will give 1 L of LR bolus    NIDDM-2: On metformin at home but does not take.  A1c 6.2%. Recent Labs  Lab 08/16/23 0808 08/16/23 1113 08/16/23 1620 08/17/23 0929 08/17/23 1202  GLUCAP 101* 127* 141* 130* 159*  -Continue SSI-sensitive.  Essential hypertension: Normotensive today. -Discontinued lisinopril and HCTZ due to rising creatinine. -Continue amlodipine 10 mg daily -P.o. hydralazine as needed  Microcytic anemia: Stable.  Anemia panel with mild iron deficiency.  B12 305. Recent Labs    08/12/23 1839 08/13/23 0622 08/14/23 0610 08/15/23 0601 08/16/23 0643 08/17/23 0737  HGB 12.4 11.9* 13.3 13.3 12.6 12.9  -B12 supplementation as above -Continue monitoring  Physical deconditioning -PT/OT recommended SNF.  Bedbug infestation -Continue contact precaution  Advance care planning: Discussed CODE STATUS with patient's daughter.  Patient is full code.  Body mass index is 26.17 kg/m.           DVT prophylaxis:  SCDs Start: 08/13/23  0543  Code Status: Full code Family Communication: None at bedside today. Level of care: Med-Surg Status is: Inpatient Remains inpatient appropriate because: Agitation/psychosis/confusion and AKI.   Final disposition: SNF Consultants:  Neurology Psychiatry  35 minutes with more than 50% spent in reviewing records, counseling patient/family and coordinating care.   Sch Meds:  Scheduled Meds:  amLODipine  10 mg Oral Daily   cholecalciferol  2,000 Units Oral Daily   vitamin B-12  1,000 mcg Oral Daily   haloperidol lactate  1 mg Intravenous Once   insulin aspart  0-9 Units Subcutaneous TID WC   OLANZapine  2.5 mg Oral q AM   And   OLANZapine  5 mg Oral QHS   Continuous Infusions:  thiamine (VITAMIN B1) injection 500 mg (08/17/23 0911)   Followed by   thiamine (VITAMIN B1) injection      PRN Meds:.acetaminophen **OR** acetaminophen, haloperidol lactate **OR** haloperidol lactate, hydrALAZINE, ondansetron **OR** ondansetron (ZOFRAN) IV, mouth rinse  Antimicrobials: Anti-infectives (From admission, onward)    Start     Dose/Rate Route Frequency Ordered Stop   08/13/23 1000  cefTRIAXone (ROCEPHIN) 1 g in sodium chloride 0.9 % 100 mL IVPB  Status:  Discontinued  1 g 200 mL/hr over 30 Minutes Intravenous Every 24 hours 08/13/23 0530 08/15/23 1424   08/12/23 2300  cefTRIAXone (ROCEPHIN) 2 g in sodium chloride 0.9 % 100 mL IVPB        2 g 200 mL/hr over 30 Minutes Intravenous  Once 08/12/23 2245 08/13/23 0018        I have personally reviewed the following labs and images: CBC: Recent Labs  Lab 08/12/23 1839 08/13/23 0622 08/14/23 0610 08/15/23 0601 08/16/23 0643 08/17/23 0737  WBC 5.8 5.0 5.9 7.6 5.9 7.1  NEUTROABS 2.7  --   --   --   --   --   HGB 12.4 11.9* 13.3 13.3 12.6 12.9  HCT 35.9* 35.7* 37.7 38.2 35.5* 36.9  MCV 78.2* 78.8* 76.0* 76.6* 75.5* 76.6*  PLT 272 262 298 313 281 274   BMP &GFR Recent Labs  Lab 08/13/23 0622 08/14/23 0610  08/15/23 0601 08/16/23 0643 08/17/23 0737  NA 139 138 137 141 137  K 3.6 4.0 3.6 3.9 4.4  CL 108 105 103 109 103  CO2 23 23 23 22 24   GLUCOSE 103* 105* 121* 109* 128*  BUN 14 12 27* 28* 29*  CREATININE 0.81 1.11* 1.36* 1.11* 1.23*  CALCIUM 9.2 9.3 9.5 9.1 9.2  MG 2.1 2.0 2.3 2.1 2.2  PHOS 3.3 4.1 4.1 4.2 3.7   Estimated Creatinine Clearance: 39.8 mL/min (A) (by C-G formula based on SCr of 1.23 mg/dL (H)). Liver & Pancreas: Recent Labs  Lab 08/12/23 1839 08/13/23 0622 08/14/23 0610 08/15/23 0601 08/16/23 0643 08/17/23 0737  AST 28 25  --   --   --   --   ALT 18 17  --   --   --   --   ALKPHOS 66 66  --   --   --   --   BILITOT 0.7 0.2  --   --   --   --   PROT 8.5* 7.8  --   --   --   --   ALBUMIN 3.8 3.7 3.5 3.5 3.3* 3.3*   No results for input(s): "LIPASE", "AMYLASE" in the last 168 hours. No results for input(s): "AMMONIA" in the last 168 hours. Diabetic: No results for input(s): "HGBA1C" in the last 72 hours.  Recent Labs  Lab 08/16/23 0808 08/16/23 1113 08/16/23 1620 08/17/23 0929 08/17/23 1202  GLUCAP 101* 127* 141* 130* 159*   Cardiac Enzymes: No results for input(s): "CKTOTAL", "CKMB", "CKMBINDEX", "TROPONINI" in the last 168 hours. No results for input(s): "PROBNP" in the last 8760 hours. Coagulation Profile: No results for input(s): "INR", "PROTIME" in the last 168 hours. Thyroid Function Tests: No results for input(s): "TSH", "T4TOTAL", "FREET4", "T3FREE", "THYROIDAB" in the last 72 hours.  Lipid Profile: No results for input(s): "CHOL", "HDL", "LDLCALC", "TRIG", "CHOLHDL", "LDLDIRECT" in the last 72 hours. Anemia Panel: No results for input(s): "VITAMINB12", "FOLATE", "FERRITIN", "TIBC", "IRON", "RETICCTPCT" in the last 72 hours.  Urine analysis:    Component Value Date/Time   COLORURINE YELLOW 08/12/2023 1752   APPEARANCEUR HAZY (A) 08/12/2023 1752   LABSPEC 1.021 08/12/2023 1752   PHURINE 6.0 08/12/2023 1752   GLUCOSEU NEGATIVE 08/12/2023  1752   HGBUR NEGATIVE 08/12/2023 1752   BILIRUBINUR NEGATIVE 08/12/2023 1752   KETONESUR NEGATIVE 08/12/2023 1752   PROTEINUR 30 (A) 08/12/2023 1752   NITRITE NEGATIVE 08/12/2023 1752   LEUKOCYTESUR LARGE (A) 08/12/2023 1752   Sepsis Labs: Invalid input(s): "PROCALCITONIN", "LACTICIDVEN"  Microbiology: Recent Results (from the past  240 hours)  Urine Culture     Status: Abnormal   Collection Time: 08/12/23  5:52 PM   Specimen: Urine, Clean Catch  Result Value Ref Range Status   Specimen Description   Final    URINE, CLEAN CATCH Performed at [facility], [address], [place], [place] [address]    Special Requests   Final    NONE Performed at [facility], [address], [place], [place] [address]    Culture MULTIPLE SPECIES PRESENT, SUGGEST RECOLLECTION (A)  Final   Report Status 08/15/2023 FINAL  Final    Radiology Studies: CT CHEST ABDOMEN PELVIS W CONTRAST Result Date: 08/16/2023 CLINICAL DATA:  Occult malignancy EXAM: CT CHEST, ABDOMEN, AND PELVIS WITH CONTRAST TECHNIQUE: Multidetector CT imaging of the chest, abdomen and pelvis was performed following the standard protocol during bolus administration of intravenous contrast. RADIATION DOSE REDUCTION: This exam was performed according to the departmental dose-optimization program which includes automated exposure control, adjustment of the mA and/or kV according to patient size and/or use of iterative reconstruction technique. CONTRAST:  75mL OMNIPAQUE IOHEXOL 350 MG/ML SOLN COMPARISON:  Chest radiograph 08/12/2023 FINDINGS: CT CHEST FINDINGS Cardiovascular: Normal heart size. No pericardial effusions. Normal caliber thoracic aorta. Mild calcification of the aorta. 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 Mediastinum/Nodes: Thyroid gland is unremarkable. Esophagus is decompressed. No significant lymphadenopathy. Lungs/Pleura: Scarring in the lung apices with scattered fibrosis in the lungs. Linear scarring  in the lung bases. Mild dependent atelectasis. No airspace disease or consolidation. No significant pulmonary nodules. Emphysematous changes in the lungs. Musculoskeletal: Degenerative changes in the spine. No focal bone lesions. CT ABDOMEN PELVIS FINDINGS Hepatobiliary: Cysts in the right lobe of liver measuring 1.4 cm diameter. This has benign appearance. No imaging follow-up is indicated. Cholelithiasis with multiple stones in the gallbladder. No gallbladder wall thickening or infiltration. Portal veins are patent. No bile duct dilatation. Pancreas: Unremarkable. No pancreatic ductal dilatation or surrounding inflammatory changes. Spleen: Normal in size without focal abnormality. Adrenals/Urinary Tract: No adrenal gland nodules. Punctate stones demonstrated in both kidneys measuring about 2 mm diameter. No hydronephrosis or hydroureter. Nephrograms are symmetrical. No solid mass lesions. Mild bladder wall thickening may indicate cystitis. Correlate with urinalysis. No focal filling defects. Stomach/Bowel: Stomach, small bowel, and colon are not abnormally distended. Ingested material in the stomach. Scattered stool in the colon. No wall thickening or inflammatory changes. Appendix is normal. Vascular/Lymphatic: Aortic atherosclerosis. No enlarged abdominal or pelvic lymph nodes. Reproductive: Uterus and bilateral adnexa are unremarkable. Other: No abdominal wall hernia or abnormality. No abdominopelvic ascites. Musculoskeletal: Degenerative changes in the spine. No focal bone lesions. IMPRESSION: 1. No primary or metastatic malignancy demonstrated in the chest abdomen or pelvis. 2. Emphysematous changes and scarring in the lungs. 3. Cholelithiasis without evidence of acute cholecystitis. 4. Bladder wall thickening may indicate cystitis. Correlate with urinalysis. 5. Aortic atherosclerosis. 6. Punctate sized nonobstructing stones in both kidneys. Electronically Signed   By: Burman Nieves M.D.   On: 08/16/2023  20:03       Channa Hazelett T. Wesly Whisenant [facility]  If 7PM-7AM, please contact night-coverage [email or web address] 08/17/2023, 2:43 PM

## 2023-08-18 ENCOUNTER — Inpatient Hospital Stay (HOSPITAL_COMMUNITY): Payer: Medicare Other

## 2023-08-18 DIAGNOSIS — I10 Essential (primary) hypertension: Secondary | ICD-10-CM | POA: Diagnosis not present

## 2023-08-18 DIAGNOSIS — G9341 Metabolic encephalopathy: Secondary | ICD-10-CM | POA: Diagnosis not present

## 2023-08-18 DIAGNOSIS — N3000 Acute cystitis without hematuria: Secondary | ICD-10-CM | POA: Diagnosis not present

## 2023-08-18 DIAGNOSIS — F0390 Unspecified dementia without behavioral disturbance: Secondary | ICD-10-CM | POA: Diagnosis not present

## 2023-08-18 LAB — MENINGITIS/ENCEPHALITIS PANEL (CSF)

## 2023-08-18 LAB — CBC
HCT: 40.7 % (ref 36.0–46.0)
Hemoglobin: 14.4 g/dL (ref 12.0–15.0)
MCH: 27 pg (ref 26.0–34.0)
MCHC: 35.4 g/dL (ref 30.0–36.0)
MCV: 76.2 fL — ABNORMAL LOW (ref 80.0–100.0)
Platelets: 319 10*3/uL (ref 150–400)
RBC: 5.34 MIL/uL — ABNORMAL HIGH (ref 3.87–5.11)
RDW: 14.2 % (ref 11.5–15.5)
WBC: 7.8 10*3/uL (ref 4.0–10.5)
nRBC: 0 % (ref 0.0–0.2)

## 2023-08-18 LAB — RENAL FUNCTION PANEL
Albumin: 3.9 g/dL (ref 3.5–5.0)
Anion gap: 9 (ref 5–15)
BUN: 19 mg/dL (ref 8–23)
CO2: 23 mmol/L (ref 22–32)
Calcium: 9.6 mg/dL (ref 8.9–10.3)
Chloride: 105 mmol/L (ref 98–111)
Creatinine, Ser: 1.02 mg/dL — ABNORMAL HIGH (ref 0.44–1.00)
GFR, Estimated: >60 mL/min (ref >60)
Glucose, Bld: 186 mg/dL — ABNORMAL HIGH (ref 70–99)
Phosphorus: 2.8 mg/dL (ref 2.5–4.6)
Potassium: 3.6 mmol/L (ref 3.5–5.1)
Sodium: 137 mmol/L (ref 135–145)

## 2023-08-18 LAB — GLUCOSE, CAPILLARY
Glucose-Capillary: 120 mg/dL — ABNORMAL HIGH (ref 70–99)
Glucose-Capillary: 124 mg/dL — ABNORMAL HIGH (ref 70–99)
Glucose-Capillary: 154 mg/dL — ABNORMAL HIGH (ref 70–99)
Glucose-Capillary: 183 mg/dL — ABNORMAL HIGH (ref 70–99)

## 2023-08-18 LAB — CSF CELL COUNT WITH DIFFERENTIAL
RBC Count, CSF: 920 /mm3 — ABNORMAL HIGH
Tube #: 4
WBC, CSF: 4 /mm3 (ref 0–5)

## 2023-08-18 LAB — PROTEIN AND GLUCOSE, CSF
Glucose, CSF: 112 mg/dL — ABNORMAL HIGH (ref 40–70)
Total  Protein, CSF: 39 mg/dL (ref 15–45)

## 2023-08-18 LAB — MAGNESIUM: Magnesium: 1.9 mg/dL (ref 1.7–2.4)

## 2023-08-18 MED ORDER — LORAZEPAM 2 MG/ML IJ SOLN
1.0000 mg | Freq: Once | INTRAMUSCULAR | Status: AC | PRN
  Administered 2023-08-18: 1 mg via INTRAVENOUS
  Filled 2023-08-18: qty 1

## 2023-08-18 MED ORDER — LIDOCAINE HCL (PF) 1 % IJ SOLN
8.0000 mL | Freq: Once | INTRAMUSCULAR | Status: AC
  Administered 2023-08-18: 8 mL

## 2023-08-18 NOTE — Plan of Care (Signed)
Lumbar puncture performed today under fluoroscopy with results as follows: Meningitis/encephalitis panel negative CSF glucose 112, serum glucose was 184 2 hours before LP was performed RBC 920 WBC 4 Protein 39 CSF Gram stain WBC present but no organisms seen CSF culture pending Autoimmune encephalitis panel pending No evidence for acute meningitis or encephalitis, will await results of autoimmune encephalitis panel.

## 2023-08-18 NOTE — Progress Notes (Signed)
Physical Therapy Treatment Patient Details Name: Shannon Barrett MRN: [id number] DOB: 12-Oct-1955 Today's Date: 08/18/2023   History of Present Illness Pt is a 68 y/o female presenting on 1/24 with AMS, confusion, visual and auditory hallucinations. EEG negative. CT head, CXR negative. UA with presumed UTI, UDS positive for benzo. Limited MRI but negative. Lumbar puncture 1/30 with negative meningitis/encephalitis, autoimmune encephalitis panel pending. Per daughter, rapid decline over the last 2 months.   PMH includes: DM, HTN, night muscle spasms.    PT Comments  Pt was lethargic in today's session and was unable to follow commands or keep eyes open. Pt was TotalA to sit EOB and was unable to maintain seated balance due to decreased LOC. Limited PT session due to decreased alertness. Pt was likely limited in today's session by earlier dose of Ativan prior to LP. Acute PT to follow.      If plan is discharge home, recommend the following: A little help with walking and/or transfers;A little help with bathing/dressing/bathroom;Assistance with cooking/housework;Direct supervision/assist for financial management;Supervision due to cognitive status;Assist for transportation;Direct supervision/assist for medications management;Help with stairs or ramp for entrance   Can travel by private vehicle      (TBD)  Equipment Recommendations  Other (comment) (TBD at next venue)       Precautions / Restrictions Precautions Precautions: Fall Precaution Comments: soft wrist restraints, waist restraint Restrictions Weight Bearing Restrictions Per Provider Order: No     Mobility  Bed Mobility Overal bed mobility: Needs Assistance Bed Mobility: Supine to Sit, Sit to Supine     Supine to sit: Total assist Sit to supine: Total assist   General bed mobility comments: totalA for all aspects, pt not able to follow commands or keep eyes open    Transfers    General transfer comment: deferred due to  decreased LOC      Balance Overall balance assessment: Needs assistance Sitting-balance support: Feet supported, Bilateral upper extremity supported Sitting balance-Leahy Scale: Zero    Cognition Arousal: Stuporous Behavior During Therapy: Flat affect Overall Cognitive Status: Difficult to assess            General Comments General comments (skin integrity, edema, etc.): Pt had ativan earlier in the morning for LP. Decreased level of alertness throughout session      Pertinent Vitals/Pain Pain Assessment Pain Assessment: PAINAD Breathing: normal Negative Vocalization: none Facial Expression: smiling or inexpressive Body Language: relaxed Consolability: no need to console PAINAD Score: 0 Pain Intervention(s): Limited activity within patient's tolerance, Monitored during session, Repositioned     PT Goals (current goals can now be found in the care plan section) Acute Rehab PT Goals PT Goal Formulation: Patient unable to participate in goal setting Time For Goal Achievement: 08/29/23 Potential to Achieve Goals: Fair Progress towards PT goals: Not progressing toward goals - comment (recent dose of ativan)    Frequency    Min 1X/week       AM-PAC PT "6 Clicks" Mobility   Outcome Measure  Help needed turning from your back to your side while in a flat bed without using bedrails?: Total Help needed moving from lying on your back to sitting on the side of a flat bed without using bedrails?: Total Help needed moving to and from a bed to a chair (including a wheelchair)?: Total Help needed standing up from a chair using your arms (e.g., wheelchair or bedside chair)?: Total Help needed to walk in hospital room?: Total Help needed climbing 3-5 steps with a railing? :  Total 6 Click Score: 6    End of Session   Activity Tolerance: Patient limited by lethargy;Patient limited by fatigue Patient left: in bed;with call bell/phone within reach;with bed alarm set;with  restraints reapplied Nurse Communication: Mobility status (decreased level of alertness) PT Visit Diagnosis: Muscle weakness (generalized) (M62.81);Unsteadiness on feet (R26.81)     Time: 8119-1478 PT Time Calculation (min) (ACUTE ONLY): 13 min  Charges:    $Therapeutic Activity: 8-22 mins PT General Charges $$ ACUTE PT VISIT: 1 Visit                     Hilton Cork, PT, DPT Secure Chat Preferred  Rehab Office [phone]   Arturo Morton Brion [employer] 08/18/2023, 4:05 PM
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
## 2023-08-18 NOTE — Procedures (Addendum)
Successful LP from the L2-L4 level. Opening pressure 9 cm H20 Approximately 6 mL CSF obtained, initially pink tinged, but clearing to colorless. Pt tolerated procedure well. No complications.  Brayton El PA-C Interventional Radiology 08/18/2023 10:17 AM

## 2023-08-18 NOTE — Progress Notes (Signed)
Vitals retaken when pt had calmed down. Pt allowed RN to take full set of vitals   08/18/23 2054 08/18/23 2110  Assess: MEWS Score  Temp  --  97.6 F (36.4 C)  BP 116/84 134/79  MAP (mmHg) 95 95  Pulse Rate (!) 119 (agitated) 97  Resp 16 18  SpO2  --  100 %  O2 Device Room Air  --   Assess: MEWS Score  MEWS Temp 0 0  MEWS Systolic 0 0  MEWS Pulse 2 0  MEWS RR 0 0  MEWS LOC 0 0  MEWS Score 2 0  MEWS Score Color Yellow Green  Assess: if the MEWS score is Yellow or Red  Were vital signs accurate and taken at a resting state? No, vital signs rechecked (pt agitated and uncooperative for CNA RN to retake no full set)  --   Does the patient meet 2 or more of the SIRS criteria? No  --   Assess: SIRS CRITERIA  SIRS Temperature  0 0  SIRS Respirations  0 0  SIRS Pulse 1 1  SIRS WBC 0 0  SIRS Score Sum  1 1

## 2023-08-18 NOTE — Progress Notes (Signed)
PROGRESS NOTE  Shannon Barrett QMV:[id number] DOB: Oct 11, 1955   PCP: Randell Patient [facility]  Patient is from: Home  DOA: 08/12/2023 LOS: 6  Chief complaints Chief Complaint  Patient presents with   Altered Mental Status     Brief Narrative / Interim history: 68 y.o. F with PMH of DM-2 and HTN brought to ED due to confusion, visual and auditory hallucination.  Reportedly with known bedbugs and bites all over.  Per POA report to triage nurse, patient was not showering or taking her medications.  Patient does confused and only oriented to self on presentation.  In ED, slightly hypertensive.  Basic labs without significant finding.  UA with large LE and rare bacteria.  UDS positive for benzo.  Does not seem to prescription for benzo.  CT head and CXR without significant finding.  Patient was given IM Geodon 10 mg x 1, IM Haldol 2 mg x 1, Benadryl 25 mg p.o. x 1 and IV ceftriaxone.  Admission requested for acute encephalopathy in the setting of presumed UTI.   Patient continued to be confused and agitated at times.  Per discussion with patient's daughter, rapid cognitive decline over the last 2 months.  Neurology consulted and recommended MRI brain with and [facility] workup.  EEG negative for seizure or epilepsy.  MRI brain with/without contrast attempted but only diffusion sequence obtained and is reassuring.  CT chest/abdomen/pelvis did not show malignancy.  Bedside LP unsuccessful on 1/29.  She underwent fluoroscopy guided LP on 1/30.  Preliminary result on CSF reassuring.  Subjective: Seen and examined earlier this morning before she had a lumbar puncture.  Sitting in bed and eating oatmeal with the help of OT.  No major events overnight of this morning.  Objective: Vitals:   08/17/23 1558 08/17/23 1944 08/18/23 0700 08/18/23 0843  BP: (!) 164/73 (!) 140/55 116/69 (!) 156/82  Pulse: 88 67 89 84  Resp: 18 17 16 17   Temp: 97.6 F (36.4 C) 98.2 F  (36.8 C) 98.1 F (36.7 C) 98.1 F (36.7 C)  TempSrc: Axillary Oral Oral   SpO2: 100% 96% 100% 100%  Weight:      Height:        Examination:  GENERAL: No apparent distress.  Nontoxic. HEENT: MMM.  Vision and hearing grossly intact.  NECK: Supple.  No apparent JVD.  RESP:  No IWOB.  Fair aeration bilaterally. CVS:  RRR. Heart sounds normal.  ABD/GI/GU: BS+. Abd soft, NTND.  MSK/EXT:  Moves extremities. No apparent deformity. No edema.  Bilateral wrist restraints. SKIN: no apparent skin lesion or wound NEURO: Sleepy but wakes to voice.  Oriented to self.  Follows commands.  No apparent focal neuro deficit. PSYCH: Calm. Normal affect.   Procedures:  None  Microbiology summarized: Urine culture pending  Assessment and plan: Confusion/agitation/audiovisual hallucination: Suspect undiagnosed major cognitive impairment with rapid decline.   Daughter noted some cognitive decline over the last 1 year that has progressed quickly in the last 2 months.  She was confused, hallucinating, wandering off and not caring for himself.  At times, she does not even recognize her daughter.  She does not take her regular meds either.  Has no PCP but goes to health department times. UDS positive for benzo without prescription.  UA with pyuria but she does not have fever or leukocytosis.  No suprapubic tenderness either.  CT head without significant finding.  RPR, CRP, HIV, TSH, folate and B12 within normal.  EEG with  mild diffuse encephalopathy but no seizure or epileptiform discharge.  MRI brain as above.  Agitation seems to have resolved with low-dose Zyprexa. -Appreciate help by neurology -S/p fluoroscopy guided lumbar puncture on 1/30.  Preliminary on CSF reassuring. -CT chest/abdomen/pelvis negative for malignancy. -S/p high-dose thiamine, B12 injection.  Continue p.o. B12, B1, and vitamin D. -Completed antibiotic course for possible UTI/pyuria. -Delirium precaution -Appreciate psychiatry  input-continue low-dose Zyprexa.  IV Haldol as needed.  QTc 426. -Continue one-to-one safety sitter and soft restraints  Pyuria: Unclear if this is UTI or just pyuria.  She has no fever, leukocytosis or suprapubic tenderness.  Urine culture with multiple species.  Received ceftriaxone from 1/24-1/27.  AKI: Likely prerenal from poor p.o. intake, lisinopril and HCTZ.  AKI improved. Recent Labs    08/12/23 1839 08/13/23 0622 08/14/23 0610 08/15/23 0601 08/16/23 0643 08/17/23 0737 08/18/23 0521  BUN 14 14 12  27* 28* 29* 19  CREATININE 0.89 0.81 1.11* 1.36* 1.11* 1.23* 1.02*  -Lisinopril/HCTZ discontinued on 1/27 -Recheck renal function in the morning. -Ensure hydration.  May need intermittent boluses.   NIDDM-2: On metformin at home but does not take.  A1c 6.2%. Recent Labs  Lab 08/17/23 1202 08/17/23 1645 08/17/23 2033 08/18/23 0846 08/18/23 1215  GLUCAP 159* 108* 186* 183* 154*  -Continue SSI-sensitive.  Essential hypertension: Normotensive today. -Discontinued lisinopril and HCTZ due to rising creatinine. -Continue amlodipine 10 mg daily -P.o. hydralazine as needed  Microcytic anemia: Stable.  Anemia panel with mild iron deficiency.  B12 305. Recent Labs    08/12/23 1839 08/13/23 0622 08/14/23 0610 08/15/23 0601 08/16/23 0643 08/17/23 0737 08/18/23 0521  HGB 12.4 11.9* 13.3 13.3 12.6 12.9 14.4  -B12 supplementation as above -Continue monitoring  Physical deconditioning -PT/OT recommended SNF.  Bedbug infestation -Continue contact precaution  Advance care planning: Discussed CODE STATUS with patient's daughter.  Patient is full code.  Body mass index is 26.17 kg/m.           DVT prophylaxis:  SCDs Start: 08/13/23 0543  Code Status: Full code Family Communication: None at bedside today. Level of care: Med-Surg Status is: Inpatient Remains inpatient appropriate because: Agitation/psychosis/confusion and AKI.   Final disposition:  SNF Consultants:  Neurology Psychiatry  35 minutes with more than 50% spent in reviewing records, counseling patient/family and coordinating care.   Sch Meds:  Scheduled Meds:  amLODipine  10 mg Oral Daily   cholecalciferol  2,000 Units Oral Daily   vitamin B-12  1,000 mcg Oral Daily   feeding supplement  237 mL Oral BID BM   haloperidol lactate  1 mg Intravenous Once   insulin aspart  0-9 Units Subcutaneous TID WC   OLANZapine  2.5 mg Oral q AM   And   OLANZapine  5 mg Oral QHS   Continuous Infusions:  thiamine (VITAMIN B1) injection 250 mg (08/18/23 1316)    PRN Meds:.acetaminophen **OR** acetaminophen, haloperidol lactate **OR** haloperidol lactate, hydrALAZINE, ondansetron **OR** ondansetron (ZOFRAN) IV, mouth rinse  Antimicrobials: Anti-infectives (From admission, onward)    Start     Dose/Rate Route Frequency Ordered Stop   08/13/23 1000  cefTRIAXone (ROCEPHIN) 1 g in sodium chloride 0.9 % 100 mL IVPB  Status:  Discontinued        1 g 200 mL/hr over 30 Minutes Intravenous Every 24 hours 08/13/23 0530 08/15/23 1424   08/12/23 2300  cefTRIAXone (ROCEPHIN) 2 g in sodium chloride 0.9 % 100 mL IVPB        2 g 200 mL/hr over  30 Minutes Intravenous  Once 08/12/23 2245 08/13/23 0018        I have personally reviewed the following labs and images: CBC: Recent Labs  Lab 08/12/23 1839 08/13/23 0622 08/14/23 0610 08/15/23 0601 08/16/23 0643 08/17/23 0737 08/18/23 0521  WBC 5.8   < > 5.9 7.6 5.9 7.1 7.8  NEUTROABS 2.7  --   --   --   --   --   --   HGB 12.4   < > 13.3 13.3 12.6 12.9 14.4  HCT 35.9*   < > 37.7 38.2 35.5* 36.9 40.7  MCV 78.2*   < > 76.0* 76.6* 75.5* 76.6* 76.2*  PLT 272   < > 298 313 281 274 319   < > = values in this interval not displayed.   BMP &GFR Recent Labs  Lab 08/14/23 0610 08/15/23 0601 08/16/23 0643 08/17/23 0737 08/18/23 0521  NA 138 137 141 137 137  K 4.0 3.6 3.9 4.4 3.6  CL 105 103 109 103 105  CO2 23 23 22 24 23   GLUCOSE  105* 121* 109* 128* 186*  BUN 12 27* 28* 29* 19  CREATININE 1.11* 1.36* 1.11* 1.23* 1.02*  CALCIUM 9.3 9.5 9.1 9.2 9.6  MG 2.0 2.3 2.1 2.2 1.9  PHOS 4.1 4.1 4.2 3.7 2.8   Estimated Creatinine Clearance: 48 mL/min (A) (by C-G formula based on SCr of 1.02 mg/dL (H)). Liver & Pancreas: Recent Labs  Lab 08/12/23 1839 08/13/23 0622 08/14/23 0610 08/15/23 0601 08/16/23 1914 08/17/23 0737 08/18/23 0521  AST 28 25  --   --   --   --   --   ALT 18 17  --   --   --   --   --   ALKPHOS 66 66  --   --   --   --   --   BILITOT 0.7 0.2  --   --   --   --   --   PROT 8.5* 7.8  --   --   --   --   --   ALBUMIN 3.8 3.7 3.5 3.5 3.3* 3.3* 3.9   No results for input(s): "LIPASE", "AMYLASE" in the last 168 hours. No results for input(s): "AMMONIA" in the last 168 hours. Diabetic: No results for input(s): "HGBA1C" in the last 72 hours.  Recent Labs  Lab 08/17/23 1202 08/17/23 1645 08/17/23 2033 08/18/23 0846 08/18/23 1215  GLUCAP 159* 108* 186* 183* 154*   Cardiac Enzymes: No results for input(s): "CKTOTAL", "CKMB", "CKMBINDEX", "TROPONINI" in the last 168 hours. No results for input(s): "PROBNP" in the last 8760 hours. Coagulation Profile: No results for input(s): "INR", "PROTIME" in the last 168 hours. Thyroid Function Tests: No results for input(s): "TSH", "T4TOTAL", "FREET4", "T3FREE", "THYROIDAB" in the last 72 hours.  Lipid Profile: No results for input(s): "CHOL", "HDL", "LDLCALC", "TRIG", "CHOLHDL", "LDLDIRECT" in the last 72 hours. Anemia Panel: No results for input(s): "VITAMINB12", "FOLATE", "FERRITIN", "TIBC", "IRON", "RETICCTPCT" in the last 72 hours.  Urine analysis:    Component Value Date/Time   COLORURINE YELLOW 08/12/2023 1752   APPEARANCEUR HAZY (A) 08/12/2023 1752   LABSPEC 1.021 08/12/2023 1752   PHURINE 6.0 08/12/2023 1752   GLUCOSEU NEGATIVE 08/12/2023 1752   HGBUR NEGATIVE 08/12/2023 1752   BILIRUBINUR NEGATIVE 08/12/2023 1752   KETONESUR NEGATIVE  08/12/2023 1752   PROTEINUR 30 (A) 08/12/2023 1752   NITRITE NEGATIVE 08/12/2023 1752   LEUKOCYTESUR LARGE (A) 08/12/2023 1752   Sepsis Labs: Invalid  input(s): "PROCALCITONIN", "LACTICIDVEN"  Microbiology: Recent Results (from the past 240 hours)  Urine Culture     Status: Abnormal   Collection Time: 08/12/23  5:52 PM   Specimen: Urine, Clean Catch  Result Value Ref Range Status   Specimen Description   Final    URINE, CLEAN CATCH Performed at [facility], [address] [place], [place] [address]    Special Requests   Final    NONE Performed at [facility], [address], [place], [place] [address]    Culture MULTIPLE SPECIES PRESENT, SUGGEST RECOLLECTION (A)  Final   Report Status 08/15/2023 FINAL  Final  CSF culture w Gram Stain     Status: None (Preliminary result)   Collection Time: 08/18/23 10:34 AM   Specimen: PATH Cytology CSF; Cerebrospinal Fluid  Result Value Ref Range Status   Specimen Description CSF  Final   Special Requests NONE  Final   Gram Stain   Final    WBC PRESENT,BOTH PMN AND MONONUCLEAR NO ORGANISMS SEEN CYTOSPIN SMEAR Performed at [facility], [address], [place], [place] [address]    Culture PENDING  Incomplete   Report Status PENDING  Incomplete    Radiology Studies: DG FL GUIDED LUMBAR PUNCTURE Result Date: 08/18/2023 CLINICAL DATA:  Altered mental status. Encephalopathy. Request for diagnostic lumbar puncture. EXAM: DIAGNOSTIC LUMBAR PUNCTURE UNDER FLUOROSCOPIC GUIDANCE FLUOROSCOPY TIME:  Radiation Exposure Index (as provided by the fluoroscopic device): 2.30 mGy Number of Acquired Images:  4 PROCEDURE: Informed consent was obtained from the patient's daughter prior to the procedure, including potential complications of headache, allergy, and pain. With the patient prone, the lower back was prepped with Betadine. 1% Lidocaine was used for local anesthesia. Lumbar puncture was attempted at the L4-5 and L3-L4 levels without success.  Ultimately, successful attempt at the L2-L3 level using a 20 gauge needle with return of initially pink but clearing to colorless CSF with an opening pressure of 9 cm water. Apparently 6 ml of CSF were obtained for laboratory studies. The patient tolerated the procedure well and there were no apparent complications. IMPRESSION: Technically successful lumbar puncture from the L2-L3 level. Procedure performed by Brayton El PA-C and supervised by Dr. Marliss Coots Electronically Signed   By: Marliss Coots M.D.   On: 08/18/2023 14:32       Faviola Klare T. Kailan Carmen [facility]  If 7PM-7AM, please contact night-coverage [email or web address] 08/18/2023, 2:52 PM

## 2023-08-18 NOTE — Progress Notes (Signed)
Speech Language Pathology Treatment: Cognitive-Linquistic  Patient Details Name: Shannon Barrett MRN: [id number] DOB: 12-22-1955 Today's Date: 08/18/2023 Time: 4098-1191 SLP Time Calculation (min) (ACUTE ONLY): 12 min  Assessment / Plan / Recommendation Clinical Impression  Pt seen for cognitive intervention during portion of breakfast. She was able to feel herself per RN. With oatmeal she continued to stir and needed verbal and tactile assist to bring spoon to mouth. Pt did open her milk independently. Majority of responses were difficult to understand due to low vocal intensity and what was discernable was unrelated to topic or question asked. She was able to clearly repeat a sentence. She told me her daughter's name was "Shannon Barrett" and when asked if it was "Shannon Barrett" she replied yes. She was not oriented to place and was not able to recall "hospital" or with choice with a delay. She needed assist with basic problem solving but when showed call bell she did accurately state it's for the tv but could not state use of red button. Dr. Alanda Slim arrived and feels that this may from a dementia that has been gradual but she is having an LP today. She has not exhibited progress since prior session/eval and agrees with ST signing off. If LP comes back positive she will likely need to undergo treatment before she would show progress in cognitive therapy. She is currently unable to retain or carry over information. Recommend she have ST at next venue of care or can reorder if she begins to show some progress.    HPI HPI: Shannon Barrett is a 68 y.o. female who has a past medical history of Diabetes mellitus without complication (HCC), Hypertension, and Night muscle spasms., who presents with confusion, visual, and auditory hallucinations.  She has been getting lost every time she leaves the house. Per neurology note there have been decline in memory and problem solving since Sept 2024. In the emergency department  her UA showed large leukocytes with rare bacteria, he urine culture is in process.  Her UDS is also positive for benzodiazepines. Additionally she does have bedbug bites.Neurology NP suspects autoimmune echephalitis and will get LP.      SLP Plan  Discharge SLP treatment due to (comment)      Recommendations for follow up therapy are one component of a multi-disciplinary discharge planning process, led by the attending physician.  Recommendations may be updated based on patient status, additional functional criteria and insurance authorization.    Recommendations                     Oral care BID   Frequent or constant Supervision/Assistance Cognitive communication deficit (Y78.295)     Discharge SLP treatment due to (comment)     [name]  08/18/2023, 9:26 AM

## 2023-08-18 NOTE — Plan of Care (Signed)
  Problem: Health Behavior/Discharge Planning: Goal: Ability to manage health-related needs will improve Outcome: Not Met (add Reason) Note: Pt confused, agitated and unaware of where she is   Problem: Clinical Measurements: Goal: Ability to maintain clinical measurements within normal limits will improve Outcome: Progressing   Problem: Skin Integrity: Goal: Risk for impaired skin integrity will decrease Outcome: Progressing   Problem: Safety: Goal: Non-violent Restraint(s) Outcome: Progressing    Problem: Metabolic: Goal: Ability to maintain appropriate glucose levels will improve Outcome: Progressing   Problem: Nutritional: Goal: Maintenance of adequate nutrition will improve Outcome: Progressing  Goal: Maintenance of adequate nutrition will improve Outcome: Progressing

## 2023-08-19 DIAGNOSIS — F0390 Unspecified dementia without behavioral disturbance: Secondary | ICD-10-CM | POA: Diagnosis not present

## 2023-08-19 DIAGNOSIS — N3000 Acute cystitis without hematuria: Secondary | ICD-10-CM | POA: Diagnosis not present

## 2023-08-19 DIAGNOSIS — G9341 Metabolic encephalopathy: Secondary | ICD-10-CM | POA: Diagnosis not present

## 2023-08-19 DIAGNOSIS — I10 Essential (primary) hypertension: Secondary | ICD-10-CM | POA: Diagnosis not present

## 2023-08-19 LAB — VDRL, CSF: VDRL Quant, CSF: NONREACTIVE

## 2023-08-19 LAB — GLUCOSE, CAPILLARY
Glucose-Capillary: 113 mg/dL — ABNORMAL HIGH (ref 70–99)
Glucose-Capillary: 104 mg/dL — ABNORMAL HIGH (ref 70–99)
Glucose-Capillary: 160 mg/dL — ABNORMAL HIGH (ref 70–99)

## 2023-08-19 NOTE — Plan of Care (Signed)
  Problem: Clinical Measurements: Goal: Ability to maintain clinical measurements within normal limits will improve Outcome: Progressing Goal: Will remain free from infection Outcome: Progressing Goal: Diagnostic test results will improve Outcome: Progressing   Problem: Activity: Goal: Risk for activity intolerance will decrease Outcome: Progressing   Problem: Skin Integrity: Goal: Risk for impaired skin integrity will decrease Outcome: Progressing   Problem: Safety: Goal: Non-violent Restraint(s) Outcome: Progressing   Problem: Coping: Goal: Ability to adjust to condition or change in health will improve Outcome: Progressing   Problem: Fluid Volume: Goal: Ability to maintain a balanced intake and output will improve Outcome: Progressing   Problem: Health Behavior/Discharge Planning: Goal: Ability to identify and utilize available resources and services will improve Outcome: Progressing Goal: Ability to manage health-related needs will improve Outcome: Progressing   Problem: Metabolic: Goal: Ability to maintain appropriate glucose levels will improve Outcome: Progressing   Problem: Nutritional: Goal: Maintenance of adequate nutrition will improve Outcome: Progressing Goal: Progress toward achieving an optimal weight will improve Outcome: Progressing   Problem: Skin Integrity: Goal: Risk for impaired skin integrity will decrease Outcome: Progressing   Problem: Tissue Perfusion: Goal: Adequacy of tissue perfusion will improve Outcome: Progressing   Problem: Safety: Goal: Non-violent Restraint(s) Outcome: Progressing

## 2023-08-19 NOTE — TOC Progression Note (Signed)
Transition of Care [facility]) - Progression Note    Patient Details  Name: Shannon Barrett MRN: [id number] Date of Birth: 10-05-1955  [facility] CM/SW Contact  Carley Hammed, LCSW Phone Number: 08/19/2023, 3:23 PM  Clinical Narrative:    CSW reviewed chart and noted that pt has had a substantial decline in mobility and functional status. Pt is less MC appropriate and SNF may need to be reconsidered. Barrier continues to be ongoing use of restraints, both physical and pharmaceutical, and the need for sitters. Pt is still undergoing medical treatment and testing. CSW will continue to follow to assist with disposition needs. TOC will be available.    Expected Discharge Plan: Assisted Living Barriers to Discharge: Continued Medical Work up, English as a second language teacher, Inadequate or no insurance, Unsafe home situation, Other (must enter comment) (MC/ALF placement)  Expected Discharge Plan and Services In-house Referral: Clinical Social Work   Post Acute Care Choice:  (ALF/ MC) Living arrangements for the past 2 months: Apartment                                       Social Determinants of Health (SDOH) Interventions SDOH Screenings   Food Insecurity: Patient Unable To Answer (08/13/2023)  Housing: Patient Unable To Answer (08/13/2023)  Transportation Needs: Patient Unable To Answer (08/13/2023)  Utilities: Patient Unable To Answer (08/13/2023)  Social Connections: Unknown (08/13/2023)  Tobacco Use: Low Risk  (08/12/2023)    Readmission Risk Interventions     No data to display

## 2023-08-19 NOTE — Progress Notes (Signed)
PROGRESS NOTE  Shannon Barrett WGN:[id number] DOB: 08/29/1955   PCP: Randell Patient [facility]  Patient is from: Home  DOA: 08/12/2023 LOS: 7  Chief complaints Chief Complaint  Patient presents with   Altered Mental Status     Brief Narrative / Interim history: 68 y.o. F with PMH of DM-2 and HTN brought to ED due to confusion, visual and auditory hallucination.  Reportedly with known bedbugs and bites all over.  Per POA report to triage nurse, patient was not showering or taking her medications.  Patient does confused and only oriented to self on presentation.  In ED, slightly hypertensive.  Basic labs without significant finding.  UA with large LE and rare bacteria.  UDS positive for benzo.  Does not seem to prescription for benzo.  CT head and CXR without significant finding.  Patient was given IM Geodon 10 mg x 1, IM Haldol 2 mg x 1, Benadryl 25 mg p.o. x 1 and IV ceftriaxone.  Admission requested for acute encephalopathy in the setting of presumed UTI.   Patient continued to be confused and agitated at times.  Per discussion with patient's daughter, rapid cognitive decline over the last 2 months.  Neurology consulted and recommended MRI brain with and transferred to [facility] for further workup.  EEG negative for seizure or epilepsy.  MRI brain with/without contrast attempted but only diffusion sequence obtained and is reassuring.  CT chest/abdomen/pelvis did not show malignancy.  Bedside LP unsuccessful on 1/29.  She underwent fluoroscopy guided LP on 1/30.  CSF chemistry and cell count reassuring.  Meningitis and encephalitis panel negative.  Subjective: Seen and examined earlier this morning.  No major events overnight of this morning.  She is sleepy this morning but wakes to voice.  Not able to converse much.  Objective: Vitals:   08/18/23 2054 08/18/23 2110 08/19/23 0227 08/19/23 0740  BP: 116/84 134/79 124/67 (!) 141/71  Pulse: (!) 119 97  61  Resp: 16 18 16 16    Temp:  97.6 F (36.4 C) (!) 97.4 F (36.3 C) 98.5 F (36.9 C)  TempSrc:  Oral Axillary Oral  SpO2:  100% 100% 100%  Weight:      Height:        Examination:  GENERAL: No apparent distress.  Nontoxic. HEENT: MMM.  Vision and hearing grossly intact.  NECK: Supple.  No apparent JVD.  RESP:  No IWOB.  Fair aeration bilaterally. CVS:  RRR. Heart sounds normal.  ABD/GI/GU: BS+. Abd soft, NTND.  MSK/EXT:  Moves extremities. No apparent deformity. No edema.  Bilateral wrist restraints. SKIN: no apparent skin lesion or wound NEURO: Sleepy but wakes to voice.  Not able to answer orientation questions.  Follows some commands.  No apparent focal neuro deficit but limited exam due to mental status. PSYCH: Calm. Normal affect.   Procedures:  None  Microbiology summarized: Urine culture pending  Assessment and plan: Confusion/agitation/audiovisual hallucination: Suspect undiagnosed major cognitive impairment with rapid decline.   Daughter noted some cognitive decline over the last 1 year that has progressed quickly in the last 2 months.  She was confused, hallucinating, wandering off and not caring for himself.  At times, she does not even recognize her daughter.  She does not take her regular meds either.  Has no PCP but goes to health department times. UDS positive for benzo without prescription.  UA with pyuria but she does not have fever or leukocytosis.  No suprapubic tenderness either.  CTH, RPR, CRP, HIV, TSH,  B6, A1, folate and B12 within normal.  EEG with mild diffuse encephalopathy but no seizure or epileptiform discharge.  MRI brain as above.  CT chest/abdomen/pelvis negative for malignancy. Agitation seems to have resolved with low-dose Zyprexa. S/p fluoroscopy guided LMP on 1/30.  CSF chemistry and cell count reassuring.  Meningitis and encephalitis panel negative.  She is encephalopathic today. -S/p high-dose thiamine, B12 injection.   -Continue p.o. B12, B1, and vitamin  D. -Completed antibiotic course for possible UTI/pyuria. -Follow CSF culture and miscellaneous lab results -Continue delirium precaution -Appreciate psychiatry input-continue low-dose Zyprexa.  IV Haldol as needed.  QTc 426. -Continue one-to-one safety sitter and soft restraints.  High risk for fall and injury. -Neurology following.  Pyuria: Unclear if this is UTI or just pyuria.  She has no fever, leukocytosis or suprapubic tenderness.  Urine culture with multiple species.  Received ceftriaxone from 1/24-1/27.  AKI: Likely prerenal from poor p.o. intake, lisinopril and HCTZ.  AKI improved. Recent Labs    08/12/23 1839 08/13/23 0622 08/14/23 0610 08/15/23 0601 08/16/23 0643 08/17/23 0737 08/18/23 0521  BUN 14 14 12  27* 28* 29* 19  CREATININE 0.89 0.81 1.11* 1.36* 1.11* 1.23* 1.02*  -Lisinopril/HCTZ discontinued on 1/27 -Recheck renal function in the morning. -Ensure hydration.  May need intermittent boluses.   NIDDM-2: On metformin at home but does not take.  A1c 6.2%. Recent Labs  Lab 08/18/23 1215 08/18/23 1646 08/18/23 2352 08/19/23 0754 08/19/23 1206  GLUCAP 154* 124* 120* 113* 104*  -Continue SSI-sensitive.  Essential hypertension: Normotensive today. -Discontinued lisinopril and HCTZ due to rising creatinine. -Continue amlodipine 10 mg daily -P.o. hydralazine as needed  Microcytic anemia: Stable.  Anemia panel with mild iron deficiency.  B12 305. Recent Labs    08/12/23 1839 08/13/23 0622 08/14/23 0610 08/15/23 0601 08/16/23 0643 08/17/23 0737 08/18/23 0521  HGB 12.4 11.9* 13.3 13.3 12.6 12.9 14.4  -B12 supplementation as above -Continue monitoring  Physical deconditioning -PT/OT recommended SNF.  Bedbug infestation -Continue contact precaution  Advance care planning: Discussed CODE STATUS with patient's daughter.  Patient is full code.  Body mass index is 26.17 kg/m.           DVT prophylaxis:  SCDs Start: 08/13/23 0543  Code Status:  Full code Family Communication: None at bedside today. Level of care: Med-Surg Status is: Inpatient Remains inpatient appropriate because: Agitation/psychosis/confusion, AKI and encephalopathy   Final disposition: SNF Consultants:  Neurology Psychiatry  35 minutes with more than 50% spent in reviewing records, counseling patient/family and coordinating care.   Sch Meds:  Scheduled Meds:  amLODipine  10 mg Oral Daily   cholecalciferol  2,000 Units Oral Daily   vitamin B-12  1,000 mcg Oral Daily   feeding supplement  237 mL Oral BID BM   haloperidol lactate  1 mg Intravenous Once   insulin aspart  0-9 Units Subcutaneous TID WC   OLANZapine  2.5 mg Oral q AM   And   OLANZapine  5 mg Oral QHS   Continuous Infusions:    PRN Meds:.acetaminophen **OR** acetaminophen, haloperidol lactate **OR** haloperidol lactate, hydrALAZINE, ondansetron **OR** ondansetron (ZOFRAN) IV, mouth rinse  Antimicrobials: Anti-infectives (From admission, onward)    Start     Dose/Rate Route Frequency Ordered Stop   08/13/23 1000  cefTRIAXone (ROCEPHIN) 1 g in sodium chloride 0.9 % 100 mL IVPB  Status:  Discontinued        1 g 200 mL/hr over 30 Minutes Intravenous Every 24 hours 08/13/23 0530 08/15/23 1424  08/12/23 2300  cefTRIAXone (ROCEPHIN) 2 g in sodium chloride 0.9 % 100 mL IVPB        2 g 200 mL/hr over 30 Minutes Intravenous  Once 08/12/23 2245 08/13/23 0018        I have personally reviewed the following labs and images: CBC: Recent Labs  Lab 08/12/23 1839 08/13/23 0622 08/14/23 0610 08/15/23 0601 08/16/23 0643 08/17/23 0737 08/18/23 0521  WBC 5.8   < > 5.9 7.6 5.9 7.1 7.8  NEUTROABS 2.7  --   --   --   --   --   --   HGB 12.4   < > 13.3 13.3 12.6 12.9 14.4  HCT 35.9*   < > 37.7 38.2 35.5* 36.9 40.7  MCV 78.2*   < > 76.0* 76.6* 75.5* 76.6* 76.2*  PLT 272   < > 298 313 281 274 319   < > = values in this interval not displayed.   BMP &GFR Recent Labs  Lab 08/14/23 0610  08/15/23 0601 08/16/23 0643 08/17/23 0737 08/18/23 0521  NA 138 137 141 137 137  K 4.0 3.6 3.9 4.4 3.6  CL 105 103 109 103 105  CO2 23 23 22 24 23   GLUCOSE 105* 121* 109* 128* 186*  BUN 12 27* 28* 29* 19  CREATININE 1.11* 1.36* 1.11* 1.23* 1.02*  CALCIUM 9.3 9.5 9.1 9.2 9.6  MG 2.0 2.3 2.1 2.2 1.9  PHOS 4.1 4.1 4.2 3.7 2.8   Estimated Creatinine Clearance: 48 mL/min (A) (by C-G formula based on SCr of 1.02 mg/dL (H)). Liver & Pancreas: Recent Labs  Lab 08/12/23 1839 08/13/23 0622 08/14/23 0610 08/15/23 0601 08/16/23 1610 08/17/23 0737 08/18/23 0521  AST 28 25  --   --   --   --   --   ALT 18 17  --   --   --   --   --   ALKPHOS 66 66  --   --   --   --   --   BILITOT 0.7 0.2  --   --   --   --   --   PROT 8.5* 7.8  --   --   --   --   --   ALBUMIN 3.8 3.7 3.5 3.5 3.3* 3.3* 3.9   No results for input(s): "LIPASE", "AMYLASE" in the last 168 hours. No results for input(s): "AMMONIA" in the last 168 hours. Diabetic: No results for input(s): "HGBA1C" in the last 72 hours.  Recent Labs  Lab 08/18/23 1215 08/18/23 1646 08/18/23 2352 08/19/23 0754 08/19/23 1206  GLUCAP 154* 124* 120* 113* 104*   Cardiac Enzymes: No results for input(s): "CKTOTAL", "CKMB", "CKMBINDEX", "TROPONINI" in the last 168 hours. No results for input(s): "PROBNP" in the last 8760 hours. Coagulation Profile: No results for input(s): "INR", "PROTIME" in the last 168 hours. Thyroid Function Tests: No results for input(s): "TSH", "T4TOTAL", "FREET4", "T3FREE", "THYROIDAB" in the last 72 hours.  Lipid Profile: No results for input(s): "CHOL", "HDL", "LDLCALC", "TRIG", "CHOLHDL", "LDLDIRECT" in the last 72 hours. Anemia Panel: No results for input(s): "VITAMINB12", "FOLATE", "FERRITIN", "TIBC", "IRON", "RETICCTPCT" in the last 72 hours.  Urine analysis:    Component Value Date/Time   COLORURINE YELLOW 08/12/2023 1752   APPEARANCEUR HAZY (A) 08/12/2023 1752   LABSPEC 1.021 08/12/2023 1752    PHURINE 6.0 08/12/2023 1752   GLUCOSEU NEGATIVE 08/12/2023 1752   HGBUR NEGATIVE 08/12/2023 1752   BILIRUBINUR NEGATIVE 08/12/2023 1752   KETONESUR NEGATIVE  08/12/2023 1752   PROTEINUR 30 (A) 08/12/2023 1752   NITRITE NEGATIVE 08/12/2023 1752   LEUKOCYTESUR LARGE (A) 08/12/2023 1752   Sepsis Labs: Invalid input(s): "PROCALCITONIN", "LACTICIDVEN"  Microbiology: Recent Results (from the past 240 hours)  Urine Culture     Status: Abnormal   Collection Time: 08/12/23  5:52 PM   Specimen: Urine, Clean Catch  Result Value Ref Range Status   Specimen Description   Final    URINE, CLEAN CATCH Performed at [facility], [address], [place], [place] [address]    Special Requests   Final    NONE Performed at [facility], [address], [place], [place] [address]    Culture MULTIPLE SPECIES PRESENT, SUGGEST RECOLLECTION (A)  Final   Report Status 08/15/2023 FINAL  Final  CSF culture w Gram Stain     Status: None (Preliminary result)   Collection Time: 08/18/23 10:34 AM   Specimen: PATH Cytology CSF; Cerebrospinal Fluid  Result Value Ref Range Status   Specimen Description CSF  Final   Special Requests NONE  Final   Gram Stain   Final    WBC PRESENT,BOTH PMN AND MONONUCLEAR NO ORGANISMS SEEN CYTOSPIN SMEAR Performed at [facility], [address] [place], [place] [address]    Culture PENDING  Incomplete   Report Status PENDING  Incomplete    Radiology Studies: No results found.      Nikholas Geffre T. Demonica Farrey [facility]  If 7PM-7AM, please contact night-coverage [email or web address] 08/19/2023, 12:24 PM

## 2023-08-19 NOTE — Progress Notes (Signed)
OT Cancellation Note  Patient Details Name: Shannon Barrett MRN: [id number] DOB: July 04, 1956   Cancelled Treatment:    Reason Eval/Treat Not Completed: Fatigue/lethargy limiting ability to participate- spoke to RN, patient too lethargic to participate at this time. Will check back as able.   Barry Brunner, OT Acute Rehabilitation Services Office [phone]   Chancy Milroy 08/19/2023, 11:14 AM

## 2023-08-20 DIAGNOSIS — G9341 Metabolic encephalopathy: Secondary | ICD-10-CM | POA: Diagnosis not present

## 2023-08-20 DIAGNOSIS — N3000 Acute cystitis without hematuria: Secondary | ICD-10-CM | POA: Diagnosis not present

## 2023-08-20 DIAGNOSIS — F0390 Unspecified dementia without behavioral disturbance: Secondary | ICD-10-CM | POA: Diagnosis not present

## 2023-08-20 DIAGNOSIS — I10 Essential (primary) hypertension: Secondary | ICD-10-CM | POA: Diagnosis not present

## 2023-08-20 LAB — RENAL FUNCTION PANEL
Albumin: 3.4 g/dL — ABNORMAL LOW (ref 3.5–5.0)
Anion gap: 11 (ref 5–15)
BUN: 33 mg/dL — ABNORMAL HIGH (ref 8–23)
CO2: 23 mmol/L (ref 22–32)
Calcium: 9.3 mg/dL (ref 8.9–10.3)
Chloride: 105 mmol/L (ref 98–111)
Creatinine, Ser: 1.06 mg/dL — ABNORMAL HIGH (ref 0.44–1.00)
GFR, Estimated: 58 mL/min — ABNORMAL LOW (ref >60)
Glucose, Bld: 102 mg/dL — ABNORMAL HIGH (ref 70–99)
Phosphorus: 3.4 mg/dL (ref 2.5–4.6)
Potassium: 3.6 mmol/L (ref 3.5–5.1)
Sodium: 139 mmol/L (ref 135–145)

## 2023-08-20 LAB — CBC
HCT: 37.4 % (ref 36.0–46.0)
Hemoglobin: 13 g/dL (ref 12.0–15.0)
MCH: 26.5 pg (ref 26.0–34.0)
MCHC: 34.8 g/dL (ref 30.0–36.0)
MCV: 76.2 fL — ABNORMAL LOW (ref 80.0–100.0)
Platelets: 259 10*3/uL (ref 150–400)
RBC: 4.91 MIL/uL (ref 3.87–5.11)
RDW: 14.3 % (ref 11.5–15.5)
WBC: 6.4 10*3/uL (ref 4.0–10.5)
nRBC: 0 % (ref 0.0–0.2)

## 2023-08-20 LAB — MAGNESIUM: Magnesium: 2.2 mg/dL (ref 1.7–2.4)

## 2023-08-20 LAB — GLUCOSE, CAPILLARY
Glucose-Capillary: 92 mg/dL (ref 70–99)
Glucose-Capillary: 81 mg/dL (ref 70–99)
Glucose-Capillary: 124 mg/dL — ABNORMAL HIGH (ref 70–99)
Glucose-Capillary: 105 mg/dL — ABNORMAL HIGH (ref 70–99)

## 2023-08-20 MED ORDER — INSULIN ASPART 100 UNIT/ML IJ SOLN
0.0000 [IU] | Freq: Three times a day (TID) | INTRAMUSCULAR | Status: DC
  Administered 2023-08-21 – 2023-08-24 (×6): 1 [IU] via SUBCUTANEOUS
  Administered 2023-08-25: 2 [IU] via SUBCUTANEOUS
  Administered 2023-08-29: 1 [IU] via SUBCUTANEOUS

## 2023-08-20 NOTE — Progress Notes (Signed)
Paraneoplastic panel has not resulted. Will continue to follow peripherally for lab results.  Bing Neighbors, MD [facility] [phone]  If 7pm- 7am, please page neurology on call as listed in AMION.

## 2023-08-20 NOTE — Plan of Care (Signed)
  Problem: Health Behavior/Discharge Planning: Goal: Ability to manage health-related needs will improve Outcome: Progressing   Problem: Clinical Measurements: Goal: Ability to maintain clinical measurements within normal limits will improve Outcome: Progressing Goal: Will remain free from infection Outcome: Progressing Goal: Diagnostic test results will improve Outcome: Progressing   Problem: Activity: Goal: Risk for activity intolerance will decrease Outcome: Progressing   Problem: Skin Integrity: Goal: Risk for impaired skin integrity will decrease Outcome: Progressing   Problem: Safety: Goal: Non-violent Restraint(s) Outcome: Progressing   Problem: Education: Goal: Ability to describe self-care measures that may prevent or decrease complications (Diabetes Survival Skills Education) will improve Outcome: Progressing Goal: Individualized Educational Video(s) Outcome: Progressing   Problem: Coping: Goal: Ability to adjust to condition or change in health will improve Outcome: Progressing   Problem: Fluid Volume: Goal: Ability to maintain a balanced intake and output will improve Outcome: Progressing   Problem: Health Behavior/Discharge Planning: Goal: Ability to identify and utilize available resources and services will improve Outcome: Progressing Goal: Ability to manage health-related needs will improve Outcome: Progressing   Problem: Metabolic: Goal: Ability to maintain appropriate glucose levels will improve Outcome: Progressing   Problem: Nutritional: Goal: Maintenance of adequate nutrition will improve Outcome: Progressing Goal: Progress toward achieving an optimal weight will improve Outcome: Progressing   Problem: Skin Integrity: Goal: Risk for impaired skin integrity will decrease Outcome: Progressing   Problem: Tissue Perfusion: Goal: Adequacy of tissue perfusion will improve Outcome: Progressing   Problem: Safety: Goal: Non-violent  Restraint(s) Outcome: Progressing

## 2023-08-20 NOTE — Progress Notes (Signed)
PROGRESS NOTE  Shannon Barrett GMW:[id number] DOB: Dec 30, 1955   PCP: [facility]  Patient is from: Home  DOA: 08/12/2023 LOS: 8  Chief complaints Chief Complaint  Patient presents with   Altered Mental Status     Brief Narrative / Interim history: 68 y.o. F with PMH of DM-2 and HTN brought to ED due to confusion, visual and auditory hallucination.  Reportedly with known bedbugs and bites all over.  Per POA report to triage nurse, patient was not showering or taking her medications.  Patient does confused and only oriented to self on presentation.  In ED, slightly hypertensive.  Basic labs without significant finding.  UA with large LE and rare bacteria.  UDS positive for benzo.  Does not seem to prescription for benzo.  CT head and CXR without significant finding.  Patient was given IM Geodon 10 mg x 1, IM Haldol 2 mg x 1, Benadryl 25 mg p.o. x 1 and IV ceftriaxone.  Admission requested for acute encephalopathy in the setting of presumed UTI.   Patient continued to be confused and agitated at times.  Per discussion with patient's daughter, rapid cognitive decline over the last 2 months.  Neurology consulted and recommended MRI brain with and transferred to [facility] for further workup.  EEG negative for seizure or epilepsy.  MRI brain with/without contrast attempted but only diffusion sequence obtained and is reassuring.  CT chest/abdomen/pelvis did not show malignancy.  Bedside LP unsuccessful on 1/29.  She underwent fluoroscopy guided LP on 1/30.  CSF chemistry and cell count reassuring.  Meningitis and encephalitis panel negative.  Paraneoplastic panel pending.  Neurology following peripherally.  Therapy recommended SNF.  Subjective: Seen and examined earlier this morning.  No major events overnight of this morning.  Patient is sleepy but wakes to voice.  She is oriented only to self.  She follows commands.  Objective: Vitals:   08/19/23 2100 08/20/23 0527  08/20/23 0805 08/20/23 1405  BP: 130/69 111/66 122/79 116/73  Pulse: (!) 101 89 62 72  Resp: 17 17 15 16   Temp: 98.3 F (36.8 C)     TempSrc: Oral     SpO2: 100% 98%  100%  Weight:      Height:        Examination:  GENERAL: No apparent distress.  Nontoxic. HEENT: MMM.  Vision and hearing grossly intact.  NECK: Supple.  No apparent JVD.  RESP:  No IWOB.  Fair aeration bilaterally. CVS:  RRR. Heart sounds normal.  ABD/GI/GU: BS+. Abd soft, NTND.  MSK/EXT:  Moves extremities. No apparent deformity. No edema.  Bilateral wrist restraints. SKIN: no apparent skin lesion or wound NEURO: Sleepy but wakes to voice.  Oriented only to self.  Follows some commands.  No apparent focal neuro deficit  PSYCH: Calm. Normal affect.   Procedures:  None  Microbiology summarized: Urine culture with multiple species. CSF culture NGTD  Assessment and plan: Confusion/agitation/audiovisual hallucination: Suspect undiagnosed major cognitive impairment with rapid decline.   Daughter noted some cognitive decline over the last 1 year that has progressed quickly in the last 2 months.  She was confused, hallucinating, wandering off and not caring for himself.  At times, she does not even recognize her daughter.  She does not take her regular meds either.  Has no PCP but goes to health department times. UDS positive for benzo without prescription.  UA with pyuria but she does not have fever or leukocytosis.  No suprapubic tenderness either.  CTH,  RPR, CRP, HIV, TSH, B6, A1, folate and B12 within normal.  EEG with mild diffuse encephalopathy but no seizure or epileptiform discharge.  MRI brain as above.  CT chest/abdomen/pelvis negative for malignancy. Agitation seems to have resolved with low-dose Zyprexa. S/p fluoroscopy guided LMP on 1/30.  CSF chemistry, cell count and cultures NGTD.  Meningitis and encephalitis panel negative.  -S/p high-dose thiamine, B12 injection.   -Continue p.o. B12, B1, and vitamin  D. -Completed antibiotic course for possible UTI/pyuria. -Follow paraneoplastic panel from CSF -Continue delirium precaution -Appreciate psychiatry input-continue low-dose Zyprexa.  IV Haldol as needed.  QTc 426. -Continue one-to-one safety sitter and soft restraints.  High risk for fall and injury. -Neurology following peripherally.  Pyuria: Unclear if this is UTI or just pyuria.  She has no fever, leukocytosis or suprapubic tenderness.  Urine culture with multiple species.  Received ceftriaxone from 1/24-1/27.  AKI: Likely prerenal from poor p.o. intake, lisinopril and HCTZ.  AKI improved. Recent Labs    08/12/23 1839 08/13/23 0622 08/14/23 0610 08/15/23 0601 08/16/23 1610 08/17/23 0737 08/18/23 0521 08/20/23 0600  BUN 14 14 12  27* 28* 29* 19 33*  CREATININE 0.89 0.81 1.11* 1.36* 1.11* 1.23* 1.02* 1.06*  -Lisinopril/HCTZ discontinued on 1/27 -Monitor renal function intermittently. -Ensure hydration.  May need intermittent boluses.   NIDDM-2: On metformin at home but does not take.  A1c 6.2%. Recent Labs  Lab 08/19/23 0754 08/19/23 1206 08/19/23 1749 08/20/23 0802 08/20/23 1145  GLUCAP 113* 104* 160* 92 81  -Decrease SSI to very sensitive.  Essential hypertension: Normotensive today. -Discontinued lisinopril and HCTZ due to rising creatinine. -Continue amlodipine 10 mg daily -P.o. hydralazine as needed  Microcytic anemia: Stable.  Anemia panel with mild iron deficiency.  B12 305. Recent Labs    08/12/23 1839 08/13/23 0622 08/14/23 0610 08/15/23 0601 08/16/23 0643 08/17/23 0737 08/18/23 0521 08/20/23 0600  HGB 12.4 11.9* 13.3 13.3 12.6 12.9 14.4 13.0  -B12 supplementation as above -Continue monitoring  Physical deconditioning -PT/OT recommended SNF.  Bedbug infestation -Continue contact precaution  Advance care planning: Discussed CODE STATUS with patient's daughter.  Patient is full code.  Body mass index is 26.17 kg/m.           DVT  prophylaxis:  SCDs Start: 08/13/23 0543  Code Status: Full code Family Communication: None at bedside today. Level of care: Med-Surg Status is: Inpatient Remains inpatient appropriate because: Agitation/psychosis/confusion, AKI and encephalopathy   Final disposition: SNF Consultants:  Neurology Psychiatry  35 minutes with more than 50% spent in reviewing records, counseling patient/family and coordinating care.   Sch Meds:  Scheduled Meds:  amLODipine  10 mg Oral Daily   cholecalciferol  2,000 Units Oral Daily   vitamin B-12  1,000 mcg Oral Daily   feeding supplement  237 mL Oral BID BM   insulin aspart  0-9 Units Subcutaneous TID WC   OLANZapine  2.5 mg Oral q AM   And   OLANZapine  5 mg Oral QHS   Continuous Infusions:    PRN Meds:.acetaminophen **OR** acetaminophen, haloperidol lactate **OR** haloperidol lactate, hydrALAZINE, ondansetron **OR** ondansetron (ZOFRAN) IV, mouth rinse  Antimicrobials: Anti-infectives (From admission, onward)    Start     Dose/Rate Route Frequency Ordered Stop   08/13/23 1000  cefTRIAXone (ROCEPHIN) 1 g in sodium chloride 0.9 % 100 mL IVPB  Status:  Discontinued        1 g 200 mL/hr over 30 Minutes Intravenous Every 24 hours 08/13/23 0530 08/15/23 1424  08/12/23 2300  cefTRIAXone (ROCEPHIN) 2 g in sodium chloride 0.9 % 100 mL IVPB        2 g 200 mL/hr over 30 Minutes Intravenous  Once 08/12/23 2245 08/13/23 0018        I have personally reviewed the following labs and images: CBC: Recent Labs  Lab 08/15/23 0601 08/16/23 0643 08/17/23 0737 08/18/23 0521 08/20/23 0600  WBC 7.6 5.9 7.1 7.8 6.4  HGB 13.3 12.6 12.9 14.4 13.0  HCT 38.2 35.5* 36.9 40.7 37.4  MCV 76.6* 75.5* 76.6* 76.2* 76.2*  PLT 313 281 274 319 259   BMP &GFR Recent Labs  Lab 08/15/23 0601 08/16/23 0643 08/17/23 0737 08/18/23 0521 08/20/23 0600  NA 137 141 137 137 139  K 3.6 3.9 4.4 3.6 3.6  CL 103 109 103 105 105  CO2 23 22 24 23 23   GLUCOSE 121*  109* 128* 186* 102*  BUN 27* 28* 29* 19 33*  CREATININE 1.36* 1.11* 1.23* 1.02* 1.06*  CALCIUM 9.5 9.1 9.2 9.6 9.3  MG 2.3 2.1 2.2 1.9 2.2  PHOS 4.1 4.2 3.7 2.8 3.4   Estimated Creatinine Clearance: 46.2 mL/min (A) (by C-G formula based on SCr of 1.06 mg/dL (H)). Liver & Pancreas: Recent Labs  Lab 08/15/23 0601 08/16/23 6270 08/17/23 0737 08/18/23 0521 08/20/23 0600  ALBUMIN 3.5 3.3* 3.3* 3.9 3.4*   No results for input(s): "LIPASE", "AMYLASE" in the last 168 hours. No results for input(s): "AMMONIA" in the last 168 hours. Diabetic: No results for input(s): "HGBA1C" in the last 72 hours.  Recent Labs  Lab 08/19/23 0754 08/19/23 1206 08/19/23 1749 08/20/23 0802 08/20/23 1145  GLUCAP 113* 104* 160* 92 81   Cardiac Enzymes: No results for input(s): "CKTOTAL", "CKMB", "CKMBINDEX", "TROPONINI" in the last 168 hours. No results for input(s): "PROBNP" in the last 8760 hours. Coagulation Profile: No results for input(s): "INR", "PROTIME" in the last 168 hours. Thyroid Function Tests: No results for input(s): "TSH", "T4TOTAL", "FREET4", "T3FREE", "THYROIDAB" in the last 72 hours.  Lipid Profile: No results for input(s): "CHOL", "HDL", "LDLCALC", "TRIG", "CHOLHDL", "LDLDIRECT" in the last 72 hours. Anemia Panel: No results for input(s): "VITAMINB12", "FOLATE", "FERRITIN", "TIBC", "IRON", "RETICCTPCT" in the last 72 hours.  Urine analysis:    Component Value Date/Time   COLORURINE YELLOW 08/12/2023 1752   APPEARANCEUR HAZY (A) 08/12/2023 1752   LABSPEC 1.021 08/12/2023 1752   PHURINE 6.0 08/12/2023 1752   GLUCOSEU NEGATIVE 08/12/2023 1752   HGBUR NEGATIVE 08/12/2023 1752   BILIRUBINUR NEGATIVE 08/12/2023 1752   KETONESUR NEGATIVE 08/12/2023 1752   PROTEINUR 30 (A) 08/12/2023 1752   NITRITE NEGATIVE 08/12/2023 1752   LEUKOCYTESUR LARGE (A) 08/12/2023 1752   Sepsis Labs: Invalid input(s): "PROCALCITONIN", "LACTICIDVEN"  Microbiology: Recent Results (from the past  240 hours)  Urine Culture     Status: Abnormal   Collection Time: 08/12/23  5:52 PM   Specimen: Urine, Clean Catch  Result Value Ref Range Status   Specimen Description   Final    URINE, CLEAN CATCH Performed at [facility], [address] [place], [place] [address]    Special Requests   Final    NONE Performed at [facility], [address], [place], [place] [address]    Culture MULTIPLE SPECIES PRESENT, SUGGEST RECOLLECTION (A)  Final   Report Status 08/15/2023 FINAL  Final  CSF culture w Gram Stain     Status: None (Preliminary result)   Collection Time: 08/18/23 10:34 AM   Specimen: PATH Cytology CSF;  Cerebrospinal Fluid  Result Value Ref Range Status   Specimen Description CSF  Final   Special Requests NONE  Final   Gram Stain   Final    WBC PRESENT,BOTH PMN AND MONONUCLEAR NO ORGANISMS SEEN CYTOSPIN SMEAR    Culture   Final    NO GROWTH 2 DAYS Performed at [facility], [address], [place], [place] [address]    Report Status PENDING  Incomplete    Radiology Studies: No results found.      Daevon Holdren T. Itai Barbian [facility]  If 7PM-7AM, please contact night-coverage [email or web address] 08/20/2023, 2:07 PM

## 2023-08-21 DIAGNOSIS — N3000 Acute cystitis without hematuria: Secondary | ICD-10-CM | POA: Diagnosis not present

## 2023-08-21 DIAGNOSIS — D509 Iron deficiency anemia, unspecified: Secondary | ICD-10-CM | POA: Diagnosis not present

## 2023-08-21 DIAGNOSIS — G9341 Metabolic encephalopathy: Secondary | ICD-10-CM | POA: Diagnosis not present

## 2023-08-21 DIAGNOSIS — I10 Essential (primary) hypertension: Secondary | ICD-10-CM | POA: Diagnosis not present

## 2023-08-21 LAB — CSF CULTURE W GRAM STAIN: Culture: NO GROWTH

## 2023-08-21 LAB — GLUCOSE, CAPILLARY
Glucose-Capillary: 107 mg/dL — ABNORMAL HIGH (ref 70–99)
Glucose-Capillary: 186 mg/dL — ABNORMAL HIGH (ref 70–99)
Glucose-Capillary: 176 mg/dL — ABNORMAL HIGH (ref 70–99)
Glucose-Capillary: 107 mg/dL — ABNORMAL HIGH (ref 70–99)

## 2023-08-21 MED ORDER — HALOPERIDOL 0.5 MG PO TABS
0.5000 mg | ORAL_TABLET | Freq: Three times a day (TID) | ORAL | Status: DC | PRN
  Administered 2023-08-21 – 2023-09-02 (×8): 0.5 mg via ORAL
  Filled 2023-08-21 (×11): qty 1

## 2023-08-21 NOTE — Plan of Care (Signed)
  Problem: Health Behavior/Discharge Planning: Goal: Ability to manage health-related needs will improve Outcome: Not Progressing   Problem: Clinical Measurements: Goal: Ability to maintain clinical measurements within normal limits will improve Outcome: Not Progressing Goal: Will remain free from infection Outcome: Not Progressing Goal: Diagnostic test results will improve Outcome: Not Progressing   Problem: Activity: Goal: Risk for activity intolerance will decrease Outcome: Not Progressing   Problem: Skin Integrity: Goal: Risk for impaired skin integrity will decrease Outcome: Not Progressing   Problem: Safety: Goal: Non-violent Restraint(s) Outcome: Not Progressing   Problem: Education: Goal: Ability to describe self-care measures that may prevent or decrease complications (Diabetes Survival Skills Education) will improve Outcome: Not Progressing Goal: Individualized Educational Video(s) Outcome: Not Progressing   Problem: Coping: Goal: Ability to adjust to condition or change in health will improve Outcome: Not Progressing   Problem: Fluid Volume: Goal: Ability to maintain a balanced intake and output will improve Outcome: Not Progressing   Problem: Health Behavior/Discharge Planning: Goal: Ability to identify and utilize available resources and services will improve Outcome: Not Progressing Goal: Ability to manage health-related needs will improve Outcome: Not Progressing   Problem: Metabolic: Goal: Ability to maintain appropriate glucose levels will improve Outcome: Not Progressing   Problem: Nutritional: Goal: Maintenance of adequate nutrition will improve Outcome: Not Progressing Goal: Progress toward achieving an optimal weight will improve Outcome: Not Progressing   Problem: Skin Integrity: Goal: Risk for impaired skin integrity will decrease Outcome: Not Progressing   Problem: Tissue Perfusion: Goal: Adequacy of tissue perfusion will  improve Outcome: Not Progressing   Problem: Safety: Goal: Non-violent Restraint(s) Outcome: Not Progressing  Patient has memory impairment and is not able to understand the education or care plan discussed

## 2023-08-21 NOTE — Progress Notes (Signed)
[facility]                                                                               Shannon Barrett, is a 68 y.o. female, DOB - 1956/02/15, WUJ:[id number] Admit date - 08/12/2023    Outpatient Primary MD for the patient is [facility]  LOS - 9  days    Brief summary   68 y.o. F with PMH of DM-2 and HTN brought to ED due to confusion, visual and auditory hallucination.  Reportedly with known bedbugs and bites all over.  Per POA report to triage nurse, patient was not showering or taking her medications.  Patient does confused and only oriented to self on presentation.  In ED, slightly hypertensive.  Basic labs without significant finding.  UA with large LE and rare bacteria.  UDS positive for benzo.  Does not seem to prescription for benzo.  CT head and CXR without significant finding.  Patient was given IM Geodon 10 mg x 1, IM Haldol 2 mg x 1, Benadryl 25 mg p.o. x 1 and IV ceftriaxone.  Admission requested for acute encephalopathy in the setting of presumed UTI.    Patient continued to be confused and agitated at times.  Per discussion with patient's daughter, rapid cognitive decline over the last 2 months.  Neurology consulted and recommended MRI brain with and transferred to [facility] for further workup.  EEG negative for seizure or epilepsy.  MRI brain with/without contrast attempted but only diffusion sequence obtained and is reassuring.  CT chest/abdomen/pelvis did not show malignancy.  Bedside LP unsuccessful on 1/29.  She underwent fluoroscopy guided LP on 1/30.  CSF chemistry and cell count reassuring.  Meningitis and encephalitis panel negative.  Paraneoplastic panel pending.  Neurology following peripherally.   Therapy recommended SNF.   Assessment & Plan    Assessment and Plan:     Confusion/agitation/audiovisual hallucination: Suspect undiagnosed major cognitive impairment with rapid decline.   Daughter noted some cognitive decline  over the last 1 year that has progressed quickly in the last 2 months.  She was confused, hallucinating, wandering off and not caring for himself.  At times, she does not even recognize her daughter.  She does not take her regular meds either.  Has no PCP but goes to health department times. UDS positive for benzo without prescription.   UA with pyuria but she does not have fever or leukocytosis.  No suprapubic tenderness either.  CTH, RPR, CRP, HIV, TSH, B6, A1, folate and B12 within normal.   EEG with mild diffuse encephalopathy but no seizure or epileptiform discharge.  MRI brain as above.  CT chest/abdomen/pelvis negative for malignancy. Agitation seems to have resolved with low-dose Zyprexa. S/p fluoroscopy guided LMP on 1/30.  CSF chemistry, cell count and cultures NGTD.  Meningitis and encephalitis panel negative.  -S/p high-dose thiamine, B12 injection.   -Continue p.o. B12, B1, and vitamin D. -Completed antibiotic course for possible UTI/pyuria. -pending  paraneoplastic panel from CSF -Continue delirium precaution -Appreciate psychiatry input-continue low-dose Zyprexa. - IV haldol as needed , -Continue one-to-one Recruitment consultant and soft restraints.  High risk for fall and injury. -Neurology following  peripherally.   Pyuria: unclear if true UTI vs asymptomatic pyuria.   She has no fever, leukocytosis or suprapubic tenderness.  Urine culture with multiple species.   Completed ceftriaxone course.    AKI: Probably secondary to poor oral intake and pre renal in origin.  Creatinine back to baseline with IV fluids.    NIDDM-2:CBG (last 3)  Recent Labs    08/20/23 2006 08/21/23 0744 08/21/23 1201  GLUCAP 105* 107* 186*   Resume SSI.    Essential hypertension:  BP parameters are much better.  Continue with amlodipine    Microcytic anemia: Stable.   Continue with iron and B12 supplementation.    Physical deconditioning -PT/OT recommended SNF.   Bedbug infestation -Continue  contact precaution     RN Pressure Injury Documentation: Pressure Injury 08/21/23 Elbow Left;Posterior Stage 2 -  Partial thickness loss of dermis presenting as a shallow open injury with a red, pink wound bed without slough. (Active)  08/21/23 1346  Location: Elbow  Location Orientation: Left;Posterior  Staging: Stage 2 -  Partial thickness loss of dermis presenting as a shallow open injury with a red, pink wound bed without slough.  Wound Description (Comments):   Present on Admission: No  Dressing Type Foam - Lift dressing to assess site every shift 08/21/23 1343     Pressure Injury 08/21/23 Elbow Posterior;Right Stage 2 -  Partial thickness loss of dermis presenting as a shallow open injury with a red, pink wound bed without slough. (Active)  08/21/23 1348  Location: Elbow  Location Orientation: Posterior;Right  Staging: Stage 2 -  Partial thickness loss of dermis presenting as a shallow open injury with a red, pink wound bed without slough.  Wound Description (Comments):   Present on Admission: No  Dressing Type Foam - Lift dressing to assess site every shift 08/21/23 1348     Estimated body mass index is 26.17 kg/m as calculated from the following:   Height as of this encounter: 5\' 2"  (1.575 m).   Weight as of this encounter: 64.9 kg.  Code Status: full code.  DVT Prophylaxis:  SCDs Start: 08/13/23 0543   Level of Care: Level of care: Med-Surg Family Communication: none at bedside.  Disposition Plan:     Remains inpatient appropriate:  waiting for SNF   Procedures:  None.   Consultants:   Neurology  Psychiatry.  Antimicrobials:   Anti-infectives (From admission, onward)    Start     Dose/Rate Route Frequency Ordered Stop   08/13/23 1000  cefTRIAXone (ROCEPHIN) 1 g in sodium chloride 0.9 % 100 mL IVPB  Status:  Discontinued        1 g 200 mL/hr over 30 Minutes Intravenous Every 24 hours 08/13/23 0530 08/15/23 1424   08/12/23 2300  cefTRIAXone (ROCEPHIN) 2 g  in sodium chloride 0.9 % 100 mL IVPB        2 g 200 mL/hr over 30 Minutes Intravenous  Once 08/12/23 2245 08/13/23 0018        Medications  Scheduled Meds:  amLODipine  10 mg Oral Daily   cholecalciferol  2,000 Units Oral Daily   vitamin B-12  1,000 mcg Oral Daily   feeding supplement  237 mL Oral BID BM   insulin aspart  0-6 Units Subcutaneous TID WC   OLANZapine  2.5 mg Oral q AM   And   OLANZapine  5 mg Oral QHS   Continuous Infusions: PRN Meds:.acetaminophen **OR** acetaminophen, haloperidol lactate **OR** haloperidol lactate, hydrALAZINE, ondansetron **OR** ondansetron (  ZOFRAN) IV, mouth rinse    Subjective:   Shannon Barrett was seen and examined today.  More alert and talking. No new complaints.   Objective:   Vitals:   08/20/23 1405 08/20/23 2010 08/21/23 0539 08/21/23 0908  BP: 116/73 122/69 125/72 120/81  Pulse: 72 (!) 103 95 69  Resp: 16 19 20 20   Temp:  98.5 F (36.9 C) 98 F (36.7 C) 97.8 F (36.6 C)  TempSrc:  Oral Oral Oral  SpO2: 100% 100% 100% 99%  Weight:      Height:        Intake/Output Summary (Last 24 hours) at 08/21/2023 1433 Last data filed at 08/21/2023 1303 Gross per 24 hour  Intake 418 ml  Output --  Net 418 ml   Filed Weights   08/13/23 1723  Weight: 64.9 kg     Exam General exam: Appears calm and comfortable  Respiratory system: Clear to auscultation. Respiratory effort normal. Cardiovascular system: S1 & S2 heard, RRR. Gastrointestinal system: Abdomen is nondistended, soft and nontender.  Central nervous system: Alert and oriented to self only. Walking in the room .  Extremities: Symmetric 5 x 5 power. Skin: No rashes,  Psychiatry: restless but no agitation.      Data Reviewed:  I have personally reviewed following labs and imaging studies   CBC Lab Results  Component Value Date   WBC 6.4 08/20/2023   RBC 4.91 08/20/2023   HGB 13.0 08/20/2023   HCT 37.4 08/20/2023   MCV 76.2 (L) 08/20/2023   MCH 26.5  08/20/2023   PLT 259 08/20/2023   MCHC 34.8 08/20/2023   RDW 14.3 08/20/2023   LYMPHSABS 2.5 08/12/2023   MONOABS 0.3 08/12/2023   EOSABS 0.2 08/12/2023   BASOSABS 0.1 08/12/2023     Last metabolic panel Lab Results  Component Value Date   NA 139 08/20/2023   K 3.6 08/20/2023   CL 105 08/20/2023   CO2 23 08/20/2023   BUN 33 (H) 08/20/2023   CREATININE 1.06 (H) 08/20/2023   GLUCOSE 102 (H) 08/20/2023   GFRNONAA 58 (L) 08/20/2023   CALCIUM 9.3 08/20/2023   PHOS 3.4 08/20/2023   PROT 7.8 08/13/2023   ALBUMIN 3.4 (L) 08/20/2023   BILITOT 0.2 08/13/2023   ALKPHOS 66 08/13/2023   AST 25 08/13/2023   ALT 17 08/13/2023   ANIONGAP 11 08/20/2023    CBG (last 3)  Recent Labs    08/20/23 2006 08/21/23 0744 08/21/23 1201  GLUCAP 105* 107* 186*      Coagulation Profile: No results for input(s): "INR", "PROTIME" in the last 168 hours.   Radiology Studies: No results found.     Kathlen Mody M.D. [facility] 08/21/2023, 2:33 PM  Available via Epic secure chat 7am-7pm After 7 pm, please refer to night coverage provider listed on amion.

## 2023-08-22 DIAGNOSIS — F0390 Unspecified dementia without behavioral disturbance: Secondary | ICD-10-CM | POA: Diagnosis not present

## 2023-08-22 DIAGNOSIS — G9341 Metabolic encephalopathy: Secondary | ICD-10-CM | POA: Diagnosis not present

## 2023-08-22 DIAGNOSIS — I10 Essential (primary) hypertension: Secondary | ICD-10-CM | POA: Diagnosis not present

## 2023-08-22 DIAGNOSIS — N3000 Acute cystitis without hematuria: Secondary | ICD-10-CM | POA: Diagnosis not present

## 2023-08-22 LAB — GLUCOSE, CAPILLARY
Glucose-Capillary: 169 mg/dL — ABNORMAL HIGH (ref 70–99)
Glucose-Capillary: 169 mg/dL — ABNORMAL HIGH (ref 70–99)
Glucose-Capillary: 142 mg/dL — ABNORMAL HIGH (ref 70–99)
Glucose-Capillary: 168 mg/dL — ABNORMAL HIGH (ref 70–99)

## 2023-08-22 LAB — VITAMIN B1: Vitamin B1 (Thiamine): 65.3 nmol/L — ABNORMAL LOW (ref 66.5–200.0)

## 2023-08-22 NOTE — Progress Notes (Addendum)
Occupational Therapy Treatment and discharge  Patient Details Name: Shannon Barrett MRN: [id number] DOB: [birth year] Today's Date: 08/22/2023   History of present illness Pt is a 68 y/o female presenting on 1/24 with AMS, confusion, visual and auditory hallucinations. EEG negative. CT head, CXR negative. UA with presumed UTI, UDS positive for benzo. Limited MRI but negative. Lumbar puncture 1/30 with negative meningitis/encephalitis, autoimmune encephalitis panel pending. Per daughter, rapid decline over the last 2 months.   PMH includes: DM, HTN, night muscle spasms.   OT comments  Patient oriented to self only, following simple commands when agreeable but when not agreeable she quickly becomes agitated (but easily redirected).  She requires supervision for ADLS (toileting, grooming, LB dressing) and IADL (making her bed).  Supervision for mobility, preference to no AD (as leaving RW) but no LOB noted. Limited by cognition, problem solving, recall and awareness.  Pt will benefit from further OT in next venue of care, but no further acute needs identified as pt is at a supervision level. OT will sign off. If further needs arise, please re-consult.       If plan is discharge home, recommend the following:  Direct supervision/assist for medications management;Direct supervision/assist for financial management;Assist for transportation;Help with stairs or ramp for entrance;Supervision due to cognitive status;A little help with walking and/or transfers;A little help with bathing/dressing/bathroom;Assistance with cooking/housework   Equipment Recommendations  Tub/shower seat    Recommendations for Other Services      Precautions / Restrictions Precautions Precautions: Fall Precaution Comments: soft wrist restraints, waist restraint Restrictions Weight Bearing Restrictions Per Provider Order: No       Mobility Bed Mobility Overal bed mobility: Modified Independent             General  bed mobility comments: for safety, no assist required    Transfers Overall transfer level: Needs assistance Equipment used: None Transfers: Sit to/from Stand Sit to Stand: Supervision           General transfer comment: for safety     Balance Overall balance assessment: Needs assistance Sitting-balance support: No upper extremity supported, Feet supported Sitting balance-Leahy Scale: Good     Standing balance support: No upper extremity supported, During functional activity Standing balance-Leahy Scale: Fair Standing balance comment: supervision for dynamic tasks                           ADL either performed or assessed with clinical judgement   ADL Overall ADL's : Needs assistance/impaired     Grooming: Supervision/safety;Standing           Upper Body Dressing : Supervision/safety;Sitting   Lower Body Dressing: Supervision/safety;Sit to/from stand   Toilet Transfer: Supervision/safety;Ambulation   Toileting- Clothing Manipulation and Hygiene: Supervision/safety;Sit to/from stand       Functional mobility during ADLs: Supervision/safety;Contact guard assist General ADL Comments: min guard at times but overall supervision, no LOB and preference to mobilize without RW    Extremity/Trunk Assessment              Vision       Perception     Praxis      Cognition Arousal: Alert Behavior During Therapy: Anxious, Restless, Impulsive (easily agitated but redirectable) Overall Cognitive Status: Difficult to assess                                 General Comments: pt oriented  to self only, following commands on her terms and if she doesn't want to complete a task she becomes agitated.  Speaking continously throughout session, calling out for others in the hallway.        Exercises      Shoulder Instructions       General Comments pt perseverating on making bed, requires redirection but demonstrating good dynamic balance  during task.  had to call in NT to assist with redirection in order to end session    Pertinent Vitals/ Pain       Pain Assessment Pain Assessment: Faces Faces Pain Scale: No hurt  Home Living                                          Prior Functioning/Environment              Frequency  Min 1X/week        Progress Toward Goals  OT Goals(current goals can now be found in the care plan section)  Progress towards OT goals: Goals met/education completed, patient discharged from OT  Acute Rehab OT Goals Patient Stated Goal: none stated OT Goal Formulation: Patient unable to participate in goal setting  Plan      Co-evaluation                 AM-PAC OT "6 Clicks" Daily Activity     Outcome Measure   Help from another person eating meals?: A Little Help from another person taking care of personal grooming?: A Little Help from another person toileting, which includes using toliet, bedpan, or urinal?: A Little Help from another person bathing (including washing, rinsing, drying)?: A Little Help from another person to put on and taking off regular upper body clothing?: A Little Help from another person to put on and taking off regular lower body clothing?: A Little 6 Click Score: 18    End of Session    OT Visit Diagnosis: Other abnormalities of gait and mobility (R26.89);Muscle weakness (generalized) (M62.81);Other symptoms and signs involving cognitive function   Activity Tolerance Patient tolerated treatment well   Patient Left in bed;with call bell/phone within reach;with bed alarm set;with restraints reapplied;with nursing/sitter in room   Nurse Communication Mobility status        Time: 1610-9604 OT Time Calculation (min): 26 min  Charges: OT General Charges $OT Visit: 1 Visit OT Treatments $Self Care/Home Management : 23-37 mins  Barry Brunner, OT Acute Rehabilitation Services Office [phone]   Chancy Milroy 08/22/2023, 1:56 PM

## 2023-08-22 NOTE — Plan of Care (Signed)
  Problem: Health Behavior/Discharge Planning: Goal: Ability to manage health-related needs will improve Outcome: Progressing   Problem: Clinical Measurements: Goal: Ability to maintain clinical measurements within normal limits will improve Outcome: Progressing Goal: Will remain free from infection Outcome: Progressing Goal: Diagnostic test results will improve Outcome: Progressing   Problem: Activity: Goal: Risk for activity intolerance will decrease Outcome: Progressing   Problem: Skin Integrity: Goal: Risk for impaired skin integrity will decrease Outcome: Progressing   Problem: Safety: Goal: Non-violent Restraint(s) Outcome: Progressing   Problem: Education: Goal: Ability to describe self-care measures that may prevent or decrease complications (Diabetes Survival Skills Education) will improve Outcome: Progressing Goal: Individualized Educational Video(s) Outcome: Progressing   Problem: Coping: Goal: Ability to adjust to condition or change in health will improve Outcome: Progressing   Problem: Fluid Volume: Goal: Ability to maintain a balanced intake and output will improve Outcome: Progressing   Problem: Health Behavior/Discharge Planning: Goal: Ability to identify and utilize available resources and services will improve Outcome: Progressing Goal: Ability to manage health-related needs will improve Outcome: Progressing   Problem: Metabolic: Goal: Ability to maintain appropriate glucose levels will improve Outcome: Progressing   Problem: Nutritional: Goal: Maintenance of adequate nutrition will improve Outcome: Progressing Goal: Progress toward achieving an optimal weight will improve Outcome: Progressing   Problem: Skin Integrity: Goal: Risk for impaired skin integrity will decrease Outcome: Progressing   Problem: Tissue Perfusion: Goal: Adequacy of tissue perfusion will improve Outcome: Progressing   Problem: Safety: Goal: Non-violent  Restraint(s) Outcome: Progressing

## 2023-08-22 NOTE — Progress Notes (Signed)
PROGRESS NOTE  Shannon Barrett:[id number] DOB: 1955-08-30   PCP: [facility]  Patient is from: Home  DOA: 08/12/2023 LOS: 10  Chief complaints Chief Complaint  Patient presents with   Altered Mental Status     Brief Narrative / Interim history: 68 y.o. F with PMH of DM-2 and HTN brought to ED due to confusion, visual and auditory hallucination.  Reportedly with known bedbugs and bites all over.  Per POA report to triage nurse, patient was not showering or taking her medications.  Patient does confused and only oriented to self on presentation.  In ED, slightly hypertensive.  Basic labs without significant finding.  UA with large LE and rare bacteria.  UDS positive for benzo.  Does not seem to prescription for benzo.  CT head and CXR without significant finding.  Patient was given IM Geodon 10 mg x 1, IM Haldol 2 mg x 1, Benadryl 25 mg p.o. x 1 and IV ceftriaxone.  Admission requested for acute encephalopathy in the setting of presumed UTI.   Patient continued to be confused and agitated at times.  Per discussion with patient's daughter, rapid cognitive decline over the last 2 months.  Neurology consulted and recommended MRI brain with and transferred to [facility] for further workup.  EEG negative for seizure or epilepsy.  MRI brain with/without contrast attempted but only diffusion sequence obtained and is reassuring.  CT chest/abdomen/pelvis did not show malignancy.  Bedside LP unsuccessful on 1/29.  She underwent fluoroscopy guided LP on 1/30.  CSF chemistry and cell count reassuring.  Meningitis and encephalitis panel negative.  Paraneoplastic/autoimmune panel pending.  Neurology following peripherally.  Therapy recommended SNF.  Subjective: Seen and examined earlier this morning.  No major events overnight of this morning. Looks happy, chatty but confused and only oriented to self. Objective: Vitals:   08/21/23 0539 08/21/23 0908 08/21/23 2023 08/22/23 0600   BP: 125/72 120/81 (!) 117/90 (!) 171/91  Pulse: 95 69 (!) 110 (!) 104  Resp: 20 20  14   Temp: 98 F (36.7 C) 97.8 F (36.6 C) 98.6 F (37 C) 98.6 F (37 C)  TempSrc: Oral Oral  Oral  SpO2: 100% 99% 96% 100%  Weight:      Height:        Examination:  GENERAL: No apparent distress.  Nontoxic. HEENT: MMM.  Vision and hearing grossly intact.  NECK: Supple.  No apparent JVD.  RESP:  No IWOB.  Fair aeration bilaterally. CVS:  RRR. Heart sounds normal.  ABD/GI/GU: BS+. Abd soft, NTND.  MSK/EXT:  Moves extremities. No apparent deformity. No edema.  Bilateral wrist restraints. SKIN: no apparent skin lesion or wound NEURO: Awake and alert.  Oriented to self.  Follows commands.  No apparent focal neuro deficit  PSYCH: Happy and chatty but confused.  Procedures:  None  Microbiology summarized: Urine culture with multiple species. CSF culture NGTD  Assessment and plan: Confusion/agitation/audiovisual hallucination: Suspect undiagnosed major cognitive impairment with rapid decline.   Daughter noted some cognitive decline over the last 1 year that has progressed quickly in the last 2 months.  She was confused, hallucinating, wandering off and not caring for himself.  At times, she does not even recognize her daughter.  She does not take her regular meds either.  Has no PCP but goes to health department times. UDS positive for benzo without prescription.  UA with pyuria but she does not have fever or leukocytosis.  No suprapubic tenderness either.  CTH, RPR,  CRP, HIV, TSH, B6, A1, folate and B12 within normal.  EEG with mild diffuse encephalopathy but no seizure or epileptiform discharge.  MRI brain as above.  CT chest/abdomen/pelvis negative for malignancy. Agitation seems to have resolved with low-dose Zyprexa. S/p fluoroscopy guided LMP on 1/30.  CSF chemistry, cell count and cultures NGTD.  Meningitis and encephalitis panel negative.  -S/p high-dose thiamine, B12 injection.   -Continue  p.o. B12, B1, and vitamin D. -Completed antibiotic course for possible UTI/pyuria. -Follow paraneoplastic panel from CSF -Continue delirium precaution -Appreciate psychiatry input-continue low-dose Zyprexa.  IV Haldol as needed.  QTc 426. -Continue one-to-one safety sitter and soft restraints.  High risk for fall and injury. -Neurology following peripherally.  Pyuria: Unclear if this is UTI or just pyuria.  She has no fever, leukocytosis or suprapubic tenderness.  Urine culture with multiple species.  Received ceftriaxone from 1/24-1/27.  AKI: Likely prerenal from poor p.o. intake, lisinopril and HCTZ.  AKI improved. Recent Labs    08/12/23 1839 08/13/23 0622 08/14/23 0610 08/15/23 0601 08/16/23 0981 08/17/23 0737 08/18/23 0521 08/20/23 0600  BUN 14 14 12  27* 28* 29* 19 33*  CREATININE 0.89 0.81 1.11* 1.36* 1.11* 1.23* 1.02* 1.06*  -Lisinopril/HCTZ discontinued on 1/27 -Monitor renal function intermittently. -Ensure hydration.  May need intermittent boluses.   NIDDM-2: On metformin at home but does not take.  A1c 6.2%. Recent Labs  Lab 08/21/23 1201 08/21/23 1704 08/21/23 2022 08/22/23 0829 08/22/23 1200  GLUCAP 186* 176* 107* 169* 169*  -Decrease SSI to very sensitive.  Essential hypertension: Normotensive today. -Discontinued lisinopril and HCTZ due to rising creatinine. -Continue amlodipine 10 mg daily -P.o. hydralazine as needed  Microcytic anemia: Stable.  Anemia panel with mild iron deficiency.  B12 305. Recent Labs    08/12/23 1839 08/13/23 0622 08/14/23 0610 08/15/23 0601 08/16/23 0643 08/17/23 0737 08/18/23 0521 08/20/23 0600  HGB 12.4 11.9* 13.3 13.3 12.6 12.9 14.4 13.0  -B12 supplementation as above -Continue monitoring  Physical deconditioning -PT/OT recommended SNF.  Bedbug infestation -Continue contact precaution  Advance care planning: Discussed CODE STATUS with patient's daughter.  Patient is full code.  Body mass index is 26.17  kg/m.  Pressure skin injury: Pressure Injury 08/21/23 Elbow Left;Posterior Stage 2 -  Partial thickness loss of dermis presenting as a shallow open injury with a red, pink wound bed without slough. (Active)  08/21/23 1346  Location: Elbow  Location Orientation: Left;Posterior  Staging: Stage 2 -  Partial thickness loss of dermis presenting as a shallow open injury with a red, pink wound bed without slough.  Wound Description (Comments):   Present on Admission: No  Dressing Type Foam - Lift dressing to assess site every shift 08/21/23 2100     Pressure Injury 08/21/23 Elbow Posterior;Right Stage 2 -  Partial thickness loss of dermis presenting as a shallow open injury with a red, pink wound bed without slough. (Active)  08/21/23 1348  Location: Elbow  Location Orientation: Posterior;Right  Staging: Stage 2 -  Partial thickness loss of dermis presenting as a shallow open injury with a red, pink wound bed without slough.  Wound Description (Comments):   Present on Admission: No  Dressing Type Foam - Lift dressing to assess site every shift 08/21/23 2100   DVT prophylaxis:  SCDs Start: 08/13/23 0543  Code Status: Full code Family Communication: None at bedside today. Level of care: Med-Surg Status is: Inpatient Remains inpatient appropriate because: Agitation/psychosis/confusion, AKI and encephalopathy   Final disposition: ALF Consultants:  Neurology Psychiatry  35 minutes with more than 50% spent in reviewing records, counseling patient/family and coordinating care.   Sch Meds:  Scheduled Meds:  amLODipine  10 mg Oral Daily   cholecalciferol  2,000 Units Oral Daily   vitamin B-12  1,000 mcg Oral Daily   feeding supplement  237 mL Oral BID BM   insulin aspart  0-6 Units Subcutaneous TID WC   OLANZapine  2.5 mg Oral q AM   And   OLANZapine  5 mg Oral QHS   Continuous Infusions:    PRN Meds:.acetaminophen **OR** acetaminophen, haloperidol, haloperidol lactate **OR**  haloperidol lactate, hydrALAZINE, ondansetron **OR** ondansetron (ZOFRAN) IV, mouth rinse  Antimicrobials: Anti-infectives (From admission, onward)    Start     Dose/Rate Route Frequency Ordered Stop   08/13/23 1000  cefTRIAXone (ROCEPHIN) 1 g in sodium chloride 0.9 % 100 mL IVPB  Status:  Discontinued        1 g 200 mL/hr over 30 Minutes Intravenous Every 24 hours 08/13/23 0530 08/15/23 1424   08/12/23 2300  cefTRIAXone (ROCEPHIN) 2 g in sodium chloride 0.9 % 100 mL IVPB        2 g 200 mL/hr over 30 Minutes Intravenous  Once 08/12/23 2245 08/13/23 0018        I have personally reviewed the following labs and images: CBC: Recent Labs  Lab 08/16/23 0643 08/17/23 0737 08/18/23 0521 08/20/23 0600  WBC 5.9 7.1 7.8 6.4  HGB 12.6 12.9 14.4 13.0  HCT 35.5* 36.9 40.7 37.4  MCV 75.5* 76.6* 76.2* 76.2*  PLT 281 274 319 259   BMP &GFR Recent Labs  Lab 08/16/23 0643 08/17/23 0737 08/18/23 0521 08/20/23 0600  NA 141 137 137 139  K 3.9 4.4 3.6 3.6  CL 109 103 105 105  CO2 22 24 23 23   GLUCOSE 109* 128* 186* 102*  BUN 28* 29* 19 33*  CREATININE 1.11* 1.23* 1.02* 1.06*  CALCIUM 9.1 9.2 9.6 9.3  MG 2.1 2.2 1.9 2.2  PHOS 4.2 3.7 2.8 3.4   Estimated Creatinine Clearance: 46.2 mL/min (A) (by C-G formula based on SCr of 1.06 mg/dL (H)). Liver & Pancreas: Recent Labs  Lab 08/16/23 [id number] 08/17/23 0737 08/18/23 0521 08/20/23 0600  ALBUMIN 3.3* 3.3* 3.9 3.4*   No results for input(s): "LIPASE", "AMYLASE" in the last 168 hours. No results for input(s): "AMMONIA" in the last 168 hours. Diabetic: No results for input(s): "HGBA1C" in the last 72 hours.  Recent Labs  Lab 08/21/23 1201 08/21/23 1704 08/21/23 2022 08/22/23 0829 08/22/23 1200  GLUCAP 186* 176* 107* 169* 169*   Cardiac Enzymes: No results for input(s): "CKTOTAL", "CKMB", "CKMBINDEX", "TROPONINI" in the last 168 hours. No results for input(s): "PROBNP" in the last 8760 hours. Coagulation Profile: No results  for input(s): "INR", "PROTIME" in the last 168 hours. Thyroid Function Tests: No results for input(s): "TSH", "T4TOTAL", "FREET4", "T3FREE", "THYROIDAB" in the last 72 hours.  Lipid Profile: No results for input(s): "CHOL", "HDL", "LDLCALC", "TRIG", "CHOLHDL", "LDLDIRECT" in the last 72 hours. Anemia Panel: No results for input(s): "VITAMINB12", "FOLATE", "FERRITIN", "TIBC", "IRON", "RETICCTPCT" in the last 72 hours.  Urine analysis:    Component Value Date/Time   COLORURINE YELLOW 08/12/2023 1752   APPEARANCEUR HAZY (A) 08/12/2023 1752   LABSPEC 1.021 08/12/2023 1752   PHURINE 6.0 08/12/2023 1752   GLUCOSEU NEGATIVE 08/12/2023 1752   HGBUR NEGATIVE 08/12/2023 1752   BILIRUBINUR NEGATIVE 08/12/2023 1752   KETONESUR NEGATIVE 08/12/2023 1752   PROTEINUR 30 (A) 08/12/2023 1752  NITRITE NEGATIVE 08/12/2023 1752   LEUKOCYTESUR LARGE (A) 08/12/2023 1752   Sepsis Labs: Invalid input(s): "PROCALCITONIN", "LACTICIDVEN"  Microbiology: Recent Results (from the past 240 hours)  Urine Culture     Status: Abnormal   Collection Time: 08/12/23  5:52 PM   Specimen: Urine, Clean Catch  Result Value Ref Range Status   Specimen Description   Final    URINE, CLEAN CATCH Performed at [facility], [address], [place], [place] [address]    Special Requests   Final    NONE Performed at [facility], [address] [place], [place] [address]    Culture MULTIPLE SPECIES PRESENT, SUGGEST RECOLLECTION (A)  Final   Report Status 08/15/2023 FINAL  Final  CSF culture w Gram Stain     Status: None   Collection Time: 08/18/23 10:34 AM   Specimen: PATH Cytology CSF; Cerebrospinal Fluid  Result Value Ref Range Status   Specimen Description CSF  Final   Special Requests NONE  Final   Gram Stain   Final    WBC PRESENT,BOTH PMN AND MONONUCLEAR NO ORGANISMS SEEN CYTOSPIN SMEAR    Culture   Final    NO GROWTH 3 DAYS Performed at [facility], [address] [place], [place] [address]     Report Status 08/21/2023 FINAL  Final    Radiology Studies: No results found.      Charlee Whitebread T. Alisah Grandberry [facility]  If 7PM-7AM, please contact night-coverage [email or web address] 08/22/2023, 1:31 PM

## 2023-08-22 NOTE — TOC Progression Note (Signed)
Transition of Care [facility]) - Progression Note    Patient Details  Name: Shannon Barrett MRN: [id number] Date of Birth: [birth year]  [facility] CM/SW Contact  Carley Hammed, LCSW Phone Number: 08/22/2023, 12:41 PM  Clinical Narrative:    CSW advised that pt continues with a posey belt at this time, but is pleasant. Recommendation continues to be SNF, physical and chemical restraints will need to be DC for 24 hours prior to discharge. Preference is for SNF [facility] and then transition to [facility]. VM left for rep at [facility] to discuss pt. Senior living rep is currently working on other [employer] options and will follow up when other options have been found. CSW spoke with Niece, Gabriel Rung who is agreeable with plan. TOC will continue to follow.    Expected Discharge Plan: Assisted Living Barriers to Discharge: Continued Medical Work up, English as a second language teacher, Inadequate or no insurance, Unsafe home situation, Other (must enter comment) (MC/ALF placement)  Expected Discharge Plan and Services In-house Referral: Clinical Social Work   Post Acute Care Choice:  (ALF/ MC) Living arrangements for the past 2 months: Apartment                                       Social Determinants of Health (SDOH) Interventions SDOH Screenings   Food Insecurity: Patient Unable To Answer (08/13/2023)  Housing: Patient Unable To Answer (08/13/2023)  Transportation Needs: Patient Unable To Answer (08/13/2023)  Utilities: Patient Unable To Answer (08/13/2023)  Social Connections: Unknown (08/13/2023)  Tobacco Use: Low Risk  (08/12/2023)    Readmission Risk Interventions     No data to display

## 2023-08-23 DIAGNOSIS — F0390 Unspecified dementia without behavioral disturbance: Secondary | ICD-10-CM | POA: Diagnosis not present

## 2023-08-23 DIAGNOSIS — N3000 Acute cystitis without hematuria: Secondary | ICD-10-CM | POA: Diagnosis not present

## 2023-08-23 DIAGNOSIS — G9341 Metabolic encephalopathy: Secondary | ICD-10-CM | POA: Diagnosis not present

## 2023-08-23 DIAGNOSIS — I10 Essential (primary) hypertension: Secondary | ICD-10-CM | POA: Diagnosis not present

## 2023-08-23 LAB — GLUCOSE, CAPILLARY
Glucose-Capillary: 141 mg/dL — ABNORMAL HIGH (ref 70–99)
Glucose-Capillary: 115 mg/dL — ABNORMAL HIGH (ref 70–99)
Glucose-Capillary: 153 mg/dL — ABNORMAL HIGH (ref 70–99)

## 2023-08-23 NOTE — Progress Notes (Signed)
 PROGRESS NOTE  Shannon Barrett FMW:[id number] DOB: 01-Oct-1955   PCP: [facility]  Patient is from: Home  DOA: 08/12/2023 LOS: 11  Chief complaints Chief Complaint  Patient presents with   Altered Mental Status     Brief Narrative / Interim history: 68 y.o. F with PMH of DM-2 and HTN brought to ED due to confusion, visual and auditory hallucination.  Reportedly with known bedbugs and bites all over.  Per POA report to triage nurse, patient was not showering or taking her medications.  Patient does confused and only oriented to self on presentation.  In ED, slightly hypertensive.  Basic labs without significant finding.  UA with large LE and rare bacteria.  UDS positive for benzo.  Does not seem to prescription for benzo.  CT head and CXR without significant finding.  Patient was given IM Geodon  10 mg x 1, IM Haldol  2 mg x 1, Benadryl  25 mg p.o. x 1 and IV ceftriaxone .  Admission requested for acute encephalopathy in the setting of presumed UTI.   Patient continued to be confused and agitated at times.  Per discussion with patient's daughter, rapid cognitive decline over the last 2 months.  Neurology consulted and recommended MRI brain with and transferred to [facility] for further workup.  EEG negative for seizure or epilepsy.  MRI brain with/without contrast attempted but only diffusion sequence obtained and is reassuring.  CT chest/abdomen/pelvis did not show malignancy.  Bedside LP unsuccessful on 1/29.  She underwent fluoroscopy guided LP on 1/30.  CSF chemistry and cell count reassuring.  Meningitis and encephalitis panel negative.  Paraneoplastic/autoimmune panel pending.  Neurology following peripherally.  Therapy recommended SNF.  TOC working on ALF.  Difficult disposition due to ongoing need of one-to-one recruitment consultant and restraints.  Subjective: Seen and examined earlier this morning.  No major events overnight of this morning.  No complaints but not a  reliable historian.  She is sleepy but wakes to voice.  Objective: Vitals:   08/22/23 1558 08/22/23 1929 08/23/23 0800 08/23/23 1207  BP: 126/82 (!) 162/77 125/81 112/72  Pulse: (!) 101 98 93 72  Resp: 16 18 18 18   Temp: 97.7 F (36.5 C) 98.6 F (37 C) (!) 97.5 F (36.4 C) 97.6 F (36.4 C)  TempSrc: Oral Oral Oral Oral  SpO2: 96% 100% 98% 98%  Weight:      Height:        Examination:  GENERAL: No apparent distress.  Nontoxic. HEENT: MMM.  Vision and hearing grossly intact.  NECK: Supple.  No apparent JVD.  RESP:  No IWOB.  Fair aeration bilaterally. CVS:  RRR. Heart sounds normal.  ABD/GI/GU: BS+. Abd soft, NTND.  MSK/EXT:  Moves extremities. No apparent deformity. No edema.  Bilateral wrist restraints. SKIN: no apparent skin lesion or wound NEURO: Sleepy but wakes to voice.  Oriented to self.  Follows commands.  No apparent focal neuro deficit  PSYCH: No distress or agitation.  Procedures:  None  Microbiology summarized: Urine culture with multiple species. CSF culture NGTD  Assessment and plan: Confusion/agitation/audiovisual hallucination: Suspect undiagnosed major cognitive impairment with rapid decline.   Daughter noted some cognitive decline over the last 1 year that has progressed quickly in the last 2 months.  She was confused, hallucinating, wandering off and not caring for himself.  At times, she does not even recognize her daughter.  She does not take her regular meds either.  Has no PCP but goes to health department times. UDS  positive for benzo without prescription.  UA with pyuria but she does not have fever or leukocytosis.  No suprapubic tenderness either.  CTH, RPR, CRP, HIV, TSH, B6, A1, folate and B12 within normal.  EEG with mild diffuse encephalopathy but no seizure or epileptiform discharge.  MRI brain as above.  CT chest/abdomen/pelvis negative for malignancy. Agitation seems to have resolved with low-dose Zyprexa . S/p fluoroscopy guided LMP on 1/30.   CSF chemistry, cell count and cultures NGTD.  Meningitis and encephalitis panel negative.  -S/p high-dose thiamine , B12 injection.   -Continue p.o. B12, B1, and vitamin D . -Completed antibiotic course for possible UTI/pyuria. -Follow paraneoplastic panel from CSF -Continue delirium precaution -Appreciate psychiatry input-continue low-dose Zyprexa .  IV Haldol  as needed.  QTc 426. -Continue one-to-one safety sitter and soft restraints.  High risk for fall and injury. -Neurology following peripherally.  Pyuria: Unclear if this is UTI or just pyuria.  She has no fever, leukocytosis or suprapubic tenderness.  Urine culture with multiple species.  Received ceftriaxone  from 1/24-1/27.  AKI: Likely prerenal from poor p.o. intake, lisinopril  and HCTZ.  AKI improved. Recent Labs    08/12/23 1839 08/13/23 0622 08/14/23 0610 08/15/23 0601 08/16/23 9356 08/17/23 0737 08/18/23 0521 08/20/23 0600  BUN 14 14 12  27* 28* 29* 19 33*  CREATININE 0.89 0.81 1.11* 1.36* 1.11* 1.23* 1.02* 1.06*  -Lisinopril /HCTZ discontinued on 1/27 -Monitor renal function intermittently. -Ensure hydration.  May need intermittent boluses.   NIDDM-2: On metformin  at home but does not take.  A1c 6.2%. Recent Labs  Lab 08/22/23 0829 08/22/23 1200 08/22/23 1703 08/22/23 2057 08/23/23 0914  GLUCAP 169* 169* 142* 168* 141*  -Decrease SSI to very sensitive.  Essential hypertension: Blood pressure seems to fluctuate likely technical -Discontinued lisinopril  and HCTZ due to rising creatinine. -Continue amlodipine  10 mg daily -P.o. hydralazine  as needed  Microcytic anemia: Stable.  Anemia panel with mild iron deficiency.  B12 305. Recent Labs    08/12/23 1839 08/13/23 0622 08/14/23 0610 08/15/23 0601 08/16/23 0643 08/17/23 0737 08/18/23 0521 08/20/23 0600  HGB 12.4 11.9* 13.3 13.3 12.6 12.9 14.4 13.0  -B12 supplementation as above -Continue monitoring  Physical deconditioning -PT/OT recommended SNF.   Difficult disposition due to ongoing need for safety sitter and restraints.  Bedbug infestation -Continue contact precaution  Advance care planning: Discussed CODE STATUS with patient's daughter.  Patient is full code.  Body mass index is 26.17 kg/m.  Pressure skin injury: Pressure Injury 08/21/23 Elbow Left;Posterior Stage 2 -  Partial thickness loss of dermis presenting as a shallow open injury with a red, pink wound bed without slough. (Active)  08/21/23 1346  Location: Elbow  Location Orientation: Left;Posterior  Staging: Stage 2 -  Partial thickness loss of dermis presenting as a shallow open injury with a red, pink wound bed without slough.  Wound Description (Comments):   Present on Admission: No  Dressing Type Foam - Lift dressing to assess site every shift 08/22/23 2048     Pressure Injury 08/21/23 Elbow Posterior;Right Stage 2 -  Partial thickness loss of dermis presenting as a shallow open injury with a red, pink wound bed without slough. (Active)  08/21/23 1348  Location: Elbow  Location Orientation: Posterior;Right  Staging: Stage 2 -  Partial thickness loss of dermis presenting as a shallow open injury with a red, pink wound bed without slough.  Wound Description (Comments):   Present on Admission: No  Dressing Type Foam - Lift dressing to assess site every shift 08/22/23 2048  DVT prophylaxis:  SCDs Start: 08/13/23 0543  Code Status: Full code Family Communication: None at bedside today. Level of care: Med-Surg Status is: Inpatient Remains inpatient appropriate because: Agitation/psychosis/confusion, AKI and encephalopathy   Final disposition: ALF Consultants:  Neurology Psychiatry  35 minutes with more than 50% spent in reviewing records, counseling patient/family and coordinating care.   Sch Meds:  Scheduled Meds:  amLODipine   10 mg Oral Daily   cholecalciferol   2,000 Units Oral Daily   vitamin B-12  1,000 mcg Oral Daily   feeding supplement  237  mL Oral BID BM   insulin  aspart  0-6 Units Subcutaneous TID WC   OLANZapine   2.5 mg Oral q AM   And   OLANZapine   5 mg Oral QHS   Continuous Infusions:    PRN Meds:.acetaminophen  **OR** acetaminophen , haloperidol , haloperidol  lactate **OR** haloperidol  lactate, hydrALAZINE , ondansetron  **OR** ondansetron  (ZOFRAN ) IV, mouth rinse  Antimicrobials: Anti-infectives (From admission, onward)    Start     Dose/Rate Route Frequency Ordered Stop   08/13/23 1000  cefTRIAXone  (ROCEPHIN ) 1 g in sodium chloride  0.9 % 100 mL IVPB  Status:  Discontinued        1 g 200 mL/hr over 30 Minutes Intravenous Every 24 hours 08/13/23 0530 08/15/23 1424   08/12/23 2300  cefTRIAXone  (ROCEPHIN ) 2 g in sodium chloride  0.9 % 100 mL IVPB        2 g 200 mL/hr over 30 Minutes Intravenous  Once 08/12/23 2245 08/13/23 0018        I have personally reviewed the following labs and images: CBC: Recent Labs  Lab 08/17/23 0737 08/18/23 0521 08/20/23 0600  WBC 7.1 7.8 6.4  HGB 12.9 14.4 13.0  HCT 36.9 40.7 37.4  MCV 76.6* 76.2* 76.2*  PLT 274 319 259   BMP &GFR Recent Labs  Lab 08/17/23 0737 08/18/23 0521 08/20/23 0600  NA 137 137 139  K 4.4 3.6 3.6  CL 103 105 105  CO2 24 23 23   GLUCOSE 128* 186* 102*  BUN 29* 19 33*  CREATININE 1.23* 1.02* 1.06*  CALCIUM 9.2 9.6 9.3  MG 2.2 1.9 2.2  PHOS 3.7 2.8 3.4   Estimated Creatinine Clearance: 46.2 mL/min (A) (by C-G formula based on SCr of 1.06 mg/dL (H)). Liver & Pancreas: Recent Labs  Lab 08/17/23 0737 08/18/23 0521 08/20/23 0600  ALBUMIN 3.3* 3.9 3.4*   No results for input(s): LIPASE, AMYLASE in the last 168 hours. No results for input(s): AMMONIA in the last 168 hours. Diabetic: No results for input(s): HGBA1C in the last 72 hours.  Recent Labs  Lab 08/22/23 0829 08/22/23 1200 08/22/23 1703 08/22/23 2057 08/23/23 0914  GLUCAP 169* 169* 142* 168* 141*   Cardiac Enzymes: No results for input(s): CKTOTAL, CKMB,  CKMBINDEX, TROPONINI in the last 168 hours. No results for input(s): PROBNP in the last 8760 hours. Coagulation Profile: No results for input(s): INR, PROTIME in the last 168 hours. Thyroid Function Tests: No results for input(s): TSH, T4TOTAL, FREET4, T3FREE, THYROIDAB in the last 72 hours.  Lipid Profile: No results for input(s): CHOL, HDL, LDLCALC, TRIG, CHOLHDL, LDLDIRECT in the last 72 hours. Anemia Panel: No results for input(s): VITAMINB12, FOLATE, FERRITIN, TIBC, IRON, RETICCTPCT in the last 72 hours.  Urine analysis:    Component Value Date/Time   COLORURINE YELLOW 08/12/2023 1752   APPEARANCEUR HAZY (A) 08/12/2023 1752   LABSPEC 1.021 08/12/2023 1752   PHURINE 6.0 08/12/2023 1752   GLUCOSEU NEGATIVE 08/12/2023 1752   HGBUR NEGATIVE  08/12/2023 1752   BILIRUBINUR NEGATIVE 08/12/2023 1752   KETONESUR NEGATIVE 08/12/2023 1752   PROTEINUR 30 (A) 08/12/2023 1752   NITRITE NEGATIVE 08/12/2023 1752   LEUKOCYTESUR LARGE (A) 08/12/2023 1752   Sepsis Labs: Invalid input(s): PROCALCITONIN, LACTICIDVEN  Microbiology: Recent Results (from the past 240 hours)  CSF culture w Gram Stain     Status: None   Collection Time: 08/18/23 10:34 AM   Specimen: PATH Cytology CSF; Cerebrospinal Fluid  Result Value Ref Range Status   Specimen Description CSF  Final   Special Requests NONE  Final   Gram Stain   Final    WBC PRESENT,BOTH PMN AND MONONUCLEAR NO ORGANISMS SEEN CYTOSPIN SMEAR    Culture   Final    NO GROWTH 3 DAYS Performed at [facility], [address] [place], [place] [address]    Report Status 08/21/2023 FINAL  Final    Radiology Studies: No results found.      Gionna Polak T. Aquiles Ruffini [facility]  If 7PM-7AM, please contact night-coverage [email or web address] 08/23/2023, 12:10 PM

## 2023-08-23 NOTE — Progress Notes (Signed)
 PT Cancellation Note  Patient Details Name: JUDYTH DEMARAIS MRN: [id number] DOB: 1955/07/29   Cancelled Treatment:    Reason Eval/Treat Not Completed: (P) Other (comment) (pt sleeping) Will continue efforts per PT plan of care following date as schedule permits.  Jaxston Chohan M Ankit Degregorio 08/23/2023, 7:22 PM

## 2023-08-23 NOTE — Plan of Care (Signed)
  Problem: Fluid Volume: Goal: Ability to maintain a balanced intake and output will improve Outcome: Progressing   Problem: Metabolic: Goal: Ability to maintain appropriate glucose levels will improve Outcome: Progressing   Problem: Nutritional: Goal: Maintenance of adequate nutrition will improve Outcome: Progressing   Problem: Skin Integrity: Goal: Risk for impaired skin integrity will decrease Outcome: Progressing   Problem: Tissue Perfusion: Goal: Adequacy of tissue perfusion will improve Outcome: Progressing   Problem: Safety: Goal: Non-violent Restraint(s) Outcome: Progressing

## 2023-08-23 NOTE — TOC Progression Note (Addendum)
 Transition of Care [facility]) - Progression Note    Patient Details  Name: Shannon Barrett MRN: [id number] Date of Birth: 02/27/1956  [facility] CM/SW Contact  Harlene Sharps, LCSW Phone Number: 08/23/2023, 1:57 PM  Clinical Narrative:    CSW advised that pt is still requiring restraints to maintain safety. CSW attempted to follow up with [facility]. TOC will continue to follow for DC needs.   4:00 [facility] currently reviewing for SNF/ [facility] placement. TOC will continue to follow.   Expected Discharge Plan: Assisted Living Barriers to Discharge: Continued Medical Work up, English As A Second Language Teacher, Inadequate or no insurance, Unsafe home situation, Other (must enter comment) (MC/ALF placement)  Expected Discharge Plan and Services In-house Referral: Clinical Social Work   Post Acute Care Choice:  (ALF/ MC) Living arrangements for the past 2 months: Apartment                                       Social Determinants of Health (SDOH) Interventions SDOH Screenings   Food Insecurity: Patient Unable To Answer (08/13/2023)  Housing: Patient Unable To Answer (08/13/2023)  Transportation Needs: Patient Unable To Answer (08/13/2023)  Utilities: Patient Unable To Answer (08/13/2023)  Social Connections: Unknown (08/13/2023)  Tobacco Use: Low Risk  (08/12/2023)    Readmission Risk Interventions     No data to display

## 2023-08-23 NOTE — Plan of Care (Signed)
  Problem: Health Behavior/Discharge Planning: Goal: Ability to manage health-related needs will improve Outcome: Progressing   Problem: Clinical Measurements: Goal: Ability to maintain clinical measurements within normal limits will improve Outcome: Progressing Goal: Will remain free from infection Outcome: Progressing Goal: Diagnostic test results will improve Outcome: Progressing   Problem: Activity: Goal: Risk for activity intolerance will decrease Outcome: Progressing   Problem: Skin Integrity: Goal: Risk for impaired skin integrity will decrease Outcome: Progressing   Problem: Safety: Goal: Non-violent Restraint(s) Outcome: Progressing   Problem: Education: Goal: Ability to describe self-care measures that may prevent or decrease complications (Diabetes Survival Skills Education) will improve Outcome: Progressing Goal: Individualized Educational Video(s) Outcome: Progressing   Problem: Coping: Goal: Ability to adjust to condition or change in health will improve Outcome: Progressing   Problem: Fluid Volume: Goal: Ability to maintain a balanced intake and output will improve Outcome: Progressing   Problem: Health Behavior/Discharge Planning: Goal: Ability to identify and utilize available resources and services will improve Outcome: Progressing Goal: Ability to manage health-related needs will improve Outcome: Progressing   Problem: Metabolic: Goal: Ability to maintain appropriate glucose levels will improve Outcome: Progressing   Problem: Nutritional: Goal: Maintenance of adequate nutrition will improve Outcome: Progressing Goal: Progress toward achieving an optimal weight will improve Outcome: Progressing   Problem: Skin Integrity: Goal: Risk for impaired skin integrity will decrease Outcome: Progressing   Problem: Tissue Perfusion: Goal: Adequacy of tissue perfusion will improve Outcome: Progressing   Problem: Safety: Goal: Non-violent  Restraint(s) Outcome: Progressing

## 2023-08-24 DIAGNOSIS — G9341 Metabolic encephalopathy: Secondary | ICD-10-CM | POA: Diagnosis not present

## 2023-08-24 DIAGNOSIS — I10 Essential (primary) hypertension: Secondary | ICD-10-CM | POA: Diagnosis not present

## 2023-08-24 DIAGNOSIS — N3000 Acute cystitis without hematuria: Secondary | ICD-10-CM | POA: Diagnosis not present

## 2023-08-24 DIAGNOSIS — F0390 Unspecified dementia without behavioral disturbance: Secondary | ICD-10-CM | POA: Diagnosis not present

## 2023-08-24 LAB — CBC
HCT: 38.1 % (ref 36.0–46.0)
Hemoglobin: 13.3 g/dL (ref 12.0–15.0)
MCH: 26.6 pg (ref 26.0–34.0)
MCHC: 34.9 g/dL (ref 30.0–36.0)
MCV: 76.2 fL — ABNORMAL LOW (ref 80.0–100.0)
Platelets: 226 10*3/uL (ref 150–400)
RBC: 5 MIL/uL (ref 3.87–5.11)
RDW: 14.1 % (ref 11.5–15.5)
WBC: 5.9 10*3/uL (ref 4.0–10.5)
nRBC: 0 % (ref 0.0–0.2)

## 2023-08-24 LAB — RENAL FUNCTION PANEL
Albumin: 3.2 g/dL — ABNORMAL LOW (ref 3.5–5.0)
Anion gap: 12 (ref 5–15)
BUN: 32 mg/dL — ABNORMAL HIGH (ref 8–23)
CO2: 25 mmol/L (ref 22–32)
Calcium: 9.5 mg/dL (ref 8.9–10.3)
Chloride: 101 mmol/L (ref 98–111)
Creatinine, Ser: 1.17 mg/dL — ABNORMAL HIGH (ref 0.44–1.00)
GFR, Estimated: 51 mL/min — ABNORMAL LOW (ref >60)
Glucose, Bld: 122 mg/dL — ABNORMAL HIGH (ref 70–99)
Phosphorus: 4 mg/dL (ref 2.5–4.6)
Potassium: 4.2 mmol/L (ref 3.5–5.1)
Sodium: 138 mmol/L (ref 135–145)

## 2023-08-24 LAB — GLUCOSE, CAPILLARY
Glucose-Capillary: 140 mg/dL — ABNORMAL HIGH (ref 70–99)
Glucose-Capillary: 177 mg/dL — ABNORMAL HIGH (ref 70–99)

## 2023-08-24 LAB — MISC LABCORP TEST (SEND OUT): Labcorp test code: 70115

## 2023-08-24 LAB — MAGNESIUM: Magnesium: 2.2 mg/dL (ref 1.7–2.4)

## 2023-08-24 NOTE — Progress Notes (Signed)
 PROGRESS NOTE  Shannon Barrett FMW:[id number] DOB: Jun 15, 1956   PCP: [facility]  Patient is from: Home  DOA: 08/12/2023 LOS: 12  Chief complaints Chief Complaint  Patient presents with   Altered Mental Status     Brief Narrative / Interim history: 68 y.o. F with PMH of DM-2 and HTN brought to ED due to confusion, visual and auditory hallucination.  Reportedly with known bedbugs and bites all over.  Per POA report to triage nurse, patient was not showering or taking her medications.  Patient does confused and only oriented to self on presentation.  In ED, slightly hypertensive.  Basic labs without significant finding.  UA with large LE and rare bacteria.  UDS positive for benzo.  Does not seem to prescription for benzo.  CT head and CXR without significant finding.  Patient was given IM Geodon  10 mg x 1, IM Haldol  2 mg x 1, Benadryl  25 mg p.o. x 1 and IV ceftriaxone .  Admission requested for acute encephalopathy in the setting of presumed UTI.   Patient continued to be confused and agitated at times.  Per discussion with patient's daughter, rapid cognitive decline over the last 2 months.  Neurology consulted and recommended MRI brain with and transferred to [facility] for further workup.  EEG negative for seizure or epilepsy.  MRI brain with/without contrast attempted but only diffusion sequence obtained and is reassuring.  CT chest/abdomen/pelvis did not show malignancy.  Bedside LP unsuccessful on 1/29.  She underwent fluoroscopy guided LP on 1/30.  CSF chemistry and cell count reassuring.  Meningitis and encephalitis panel negative.  Paraneoplastic/autoimmune panel pending.  Neurology following peripherally.  Therapy recommended SNF.  TOC working on ALF.  Difficult disposition due to ongoing need of one-to-one recruitment consultant and restraints.  Subjective: Seen and examined earlier this morning.  No major events overnight of this morning.  No complaints but not a great  historian.  She is awake and alert sitting on the bed.  Bilateral wrist restraints.  Restless in bed  Objective: Vitals:   08/23/23 1207 08/23/23 1554 08/24/23 0408 08/24/23 0809  BP: 112/72 115/66 (!) 98/57 128/73  Pulse: 72 85 75 99  Resp: 18 18 17 16   Temp: 97.6 F (36.4 C) 98.5 F (36.9 C) 98.6 F (37 C) 97.9 F (36.6 C)  TempSrc: Oral Oral Axillary Oral  SpO2: 98% 100% 97% 100%  Weight:      Height:        Examination:  GENERAL: No apparent distress.  Nontoxic.  Somewhat restless. HEENT: MMM.  Vision and hearing grossly intact.  NECK: Supple.  No apparent JVD.  RESP:  No IWOB.  Fair aeration bilaterally. CVS:  RRR. Heart sounds normal.  ABD/GI/GU: BS+. Abd soft, NTND.  MSK/EXT:  Moves extremities. No apparent deformity. No edema.  Bilateral wrist restraints. SKIN: no apparent skin lesion or wound NEURO: Sleepy but wakes to voice.  Oriented to self.  Follows commands.  No apparent focal neuro deficit  PSYCH: No distress or agitation.  Restless.  Procedures:  None  Microbiology summarized: Urine culture with multiple species. CSF culture NGTD  Assessment and plan: Confusion/agitation/audiovisual hallucination: Suspect undiagnosed major cognitive impairment with rapid decline.   Daughter noted some cognitive decline over the last 1 year that has progressed quickly in the last 2 months.  She was confused, hallucinating, wandering off and not caring for himself.  At times, she does not even recognize her daughter.  She does not take her regular  meds either.  Has no PCP but goes to health department times. UDS positive for benzo without prescription.  UA with pyuria but she does not have fever or leukocytosis.  No suprapubic tenderness either.  CTH, RPR, CRP, HIV, TSH, B6, A1, folate and B12 within normal.  EEG with mild diffuse encephalopathy but no seizure or epileptiform discharge.  MRI brain as above.  CT chest/abdomen/pelvis negative for malignancy. Agitation seems to have  resolved with low-dose Zyprexa . S/p fluoroscopy guided LMP on 1/30.  CSF chemistry, cell count and cultures NGTD.  Meningitis and encephalitis panel negative.  -S/p high-dose thiamine , B12 injection.   -Continue p.o. B12, B1, and vitamin D . -Completed antibiotic course for possible UTI/pyuria. -Follow paraneoplastic panel from CSF -Continue delirium precaution -Appreciate psychiatry input-continue low-dose Zyprexa .  IV Haldol  as needed.  QTc 426. -Continue one-to-one safety sitter and soft restraints.  High risk for fall and injury. -Neurology following peripherally.  Pyuria: Unclear if this is UTI or just pyuria.  She has no fever, leukocytosis or suprapubic tenderness.  Urine culture with multiple species.  Received ceftriaxone  from 1/24-1/27.  AKI: Likely prerenal from poor p.o. intake, lisinopril  and HCTZ.  AKI improved. Recent Labs    08/12/23 1839 08/13/23 0622 08/14/23 0610 08/15/23 0601 08/16/23 9356 08/17/23 0737 08/18/23 0521 08/20/23 0600 08/24/23 0632  BUN 14 14 12  27* 28* 29* 19 33* 32*  CREATININE 0.89 0.81 1.11* 1.36* 1.11* 1.23* 1.02* 1.06* 1.17*  -Lisinopril /HCTZ discontinued on 1/27 -Monitor renal function intermittently. -Ensure hydration.  May need intermittent boluses.   NIDDM-2: On metformin  at home but does not take.  A1c 6.2%. Recent Labs  Lab 08/22/23 2057 08/23/23 0914 08/23/23 1212 08/23/23 1558 08/24/23 0806  GLUCAP 168* 141* 115* 153* 140*  -Decrease SSI to very sensitive.  Essential hypertension: Blood pressure seems to fluctuate likely technical -Discontinued lisinopril  and HCTZ due to rising creatinine. -Continue amlodipine  10 mg daily -P.o. hydralazine  as needed  Microcytic anemia: Stable.  Anemia panel with mild iron deficiency.  B12 305. Recent Labs    08/12/23 1839 08/13/23 0622 08/14/23 0610 08/15/23 0601 08/16/23 9356 08/17/23 0737 08/18/23 0521 08/20/23 0600 08/24/23 0632  HGB 12.4 11.9* 13.3 13.3 12.6 12.9 14.4 13.0  13.3  -B12 supplementation as above -Continue monitoring  Physical deconditioning -PT/OT recommended SNF.  Difficult disposition due to ongoing need for safety sitter and restraints.  Bedbug infestation -Continue contact precaution  Advance care planning: Discussed CODE STATUS with patient's daughter.  Patient is full code.  Body mass index is 26.17 kg/m.  Pressure skin injury: Not POA Pressure Injury 08/21/23 Elbow Left;Posterior Stage 2 -  Partial thickness loss of dermis presenting as a shallow open injury with a red, pink wound bed without slough. (Active)  08/21/23 1346  Location: Elbow  Location Orientation: Left;Posterior  Staging: Stage 2 -  Partial thickness loss of dermis presenting as a shallow open injury with a red, pink wound bed without slough.  Wound Description (Comments):   Present on Admission: No  Dressing Type Foam - Lift dressing to assess site every shift 08/23/23 2158     Pressure Injury 08/21/23 Elbow Posterior;Right Stage 2 -  Partial thickness loss of dermis presenting as a shallow open injury with a red, pink wound bed without slough. (Active)  08/21/23 1348  Location: Elbow  Location Orientation: Posterior;Right  Staging: Stage 2 -  Partial thickness loss of dermis presenting as a shallow open injury with a red, pink wound bed without slough.  Wound Description (Comments):  Present on Admission: No  Dressing Type Foam - Lift dressing to assess site every shift 08/23/23 2158   DVT prophylaxis:  SCDs Start: 08/13/23 0543  Code Status: Full code Family Communication: None at bedside today. Level of care: Med-Surg Status is: Inpatient Remains inpatient appropriate because: Agitation/psychosis/confusion, AKI and encephalopathy   Final disposition: ALF Consultants:  Neurology Psychiatry  35 minutes with more than 50% spent in reviewing records, counseling patient/family and coordinating care.   Sch Meds:  Scheduled Meds:  amLODipine   10 mg  Oral Daily   cholecalciferol   2,000 Units Oral Daily   vitamin B-12  1,000 mcg Oral Daily   feeding supplement  237 mL Oral BID BM   insulin  aspart  0-6 Units Subcutaneous TID WC   OLANZapine   2.5 mg Oral q AM   And   OLANZapine   5 mg Oral QHS   Continuous Infusions:    PRN Meds:.acetaminophen  **OR** acetaminophen , haloperidol , haloperidol  lactate **OR** haloperidol  lactate, hydrALAZINE , ondansetron  **OR** ondansetron  (ZOFRAN ) IV, mouth rinse  Antimicrobials: Anti-infectives (From admission, onward)    Start     Dose/Rate Route Frequency Ordered Stop   08/13/23 1000  cefTRIAXone  (ROCEPHIN ) 1 g in sodium chloride  0.9 % 100 mL IVPB  Status:  Discontinued        1 g 200 mL/hr over 30 Minutes Intravenous Every 24 hours 08/13/23 0530 08/15/23 1424   08/12/23 2300  cefTRIAXone  (ROCEPHIN ) 2 g in sodium chloride  0.9 % 100 mL IVPB        2 g 200 mL/hr over 30 Minutes Intravenous  Once 08/12/23 2245 08/13/23 0018        I have personally reviewed the following labs and images: CBC: Recent Labs  Lab 08/18/23 0521 08/20/23 0600 08/24/23 0632  WBC 7.8 6.4 5.9  HGB 14.4 13.0 13.3  HCT 40.7 37.4 38.1  MCV 76.2* 76.2* 76.2*  PLT 319 259 226   BMP &GFR Recent Labs  Lab 08/18/23 0521 08/20/23 0600 08/24/23 0632  NA 137 139 138  K 3.6 3.6 4.2  CL 105 105 101  CO2 23 23 25   GLUCOSE 186* 102* 122*  BUN 19 33* 32*  CREATININE 1.02* 1.06* 1.17*  CALCIUM 9.6 9.3 9.5  MG 1.9 2.2 2.2  PHOS 2.8 3.4 4.0   Estimated Creatinine Clearance: 41.8 mL/min (A) (by C-G formula based on SCr of 1.17 mg/dL (H)). Liver & Pancreas: Recent Labs  Lab 08/18/23 0521 08/20/23 0600 08/24/23 0632  ALBUMIN 3.9 3.4* 3.2*   No results for input(s): LIPASE, AMYLASE in the last 168 hours. No results for input(s): AMMONIA in the last 168 hours. Diabetic: No results for input(s): HGBA1C in the last 72 hours.  Recent Labs  Lab 08/22/23 2057 08/23/23 0914 08/23/23 1212 08/23/23 1558  08/24/23 0806  GLUCAP 168* 141* 115* 153* 140*   Cardiac Enzymes: No results for input(s): CKTOTAL, CKMB, CKMBINDEX, TROPONINI in the last 168 hours. No results for input(s): PROBNP in the last 8760 hours. Coagulation Profile: No results for input(s): INR, PROTIME in the last 168 hours. Thyroid Function Tests: No results for input(s): TSH, T4TOTAL, FREET4, T3FREE, THYROIDAB in the last 72 hours.  Lipid Profile: No results for input(s): CHOL, HDL, LDLCALC, TRIG, CHOLHDL, LDLDIRECT in the last 72 hours. Anemia Panel: No results for input(s): VITAMINB12, FOLATE, FERRITIN, TIBC, IRON, RETICCTPCT in the last 72 hours.  Urine analysis:    Component Value Date/Time   COLORURINE YELLOW 08/12/2023 1752   APPEARANCEUR HAZY (A) 08/12/2023 1752  LABSPEC 1.021 08/12/2023 1752   PHURINE 6.0 08/12/2023 1752   GLUCOSEU NEGATIVE 08/12/2023 1752   HGBUR NEGATIVE 08/12/2023 1752   BILIRUBINUR NEGATIVE 08/12/2023 1752   KETONESUR NEGATIVE 08/12/2023 1752   PROTEINUR 30 (A) 08/12/2023 1752   NITRITE NEGATIVE 08/12/2023 1752   LEUKOCYTESUR LARGE (A) 08/12/2023 1752   Sepsis Labs: Invalid input(s): PROCALCITONIN, LACTICIDVEN  Microbiology: Recent Results (from the past 240 hours)  CSF culture w Gram Stain     Status: None   Collection Time: 08/18/23 10:34 AM   Specimen: PATH Cytology CSF; Cerebrospinal Fluid  Result Value Ref Range Status   Specimen Description CSF  Final   Special Requests NONE  Final   Gram Stain   Final    WBC PRESENT,BOTH PMN AND MONONUCLEAR NO ORGANISMS SEEN CYTOSPIN SMEAR    Culture   Final    NO GROWTH 3 DAYS Performed at [facility], [address], [place], [place] [address]    Report Status 08/21/2023 FINAL  Final    Radiology Studies: No results found.      Joshau Code T. Tawonda Legaspi [facility]  If 7PM-7AM, please contact night-coverage [email or web address] 08/24/2023, 2:16 PM

## 2023-08-24 NOTE — TOC Progression Note (Signed)
 Transition of Care [facility]) - Progression Note    Patient Details  Name: Shannon Barrett MRN: [id number] Date of Birth: 1956-04-15  [facility] CM/SW Contact  Harlene Sharps, LCSW Phone Number: 08/24/2023, 3:49 PM  Clinical Narrative:     CSW spoke with [facility] who noted they are still reviewing pt. TOC will continue to follow.   Expected Discharge Plan: Assisted Living Barriers to Discharge: Continued Medical Work up, English As A Second Language Teacher, Inadequate or no insurance, Unsafe home situation, Other (must enter comment) (MC/ALF placement)  Expected Discharge Plan and Services In-house Referral: Clinical Social Work   Post Acute Care Choice:  (ALF/ MC) Living arrangements for the past 2 months: Apartment                                       Social Determinants of Health (SDOH) Interventions SDOH Screenings   Food Insecurity: Patient Unable To Answer (08/13/2023)  Housing: Patient Unable To Answer (08/13/2023)  Transportation Needs: Patient Unable To Answer (08/13/2023)  Utilities: Patient Unable To Answer (08/13/2023)  Social Connections: Unknown (08/13/2023)  Tobacco Use: Low Risk  (08/12/2023)    Readmission Risk Interventions     No data to display

## 2023-08-24 NOTE — Progress Notes (Signed)
 Physical Therapy Treatment Patient Details Name: Shannon Barrett MRN: [id number] DOB: July 10, 1956 Today's Date: 08/24/2023   History of Present Illness Pt is a 68 y/o female presenting on 1/24 with AMS, confusion, visual and auditory hallucinations. EEG negative. CT head, CXR negative. UA with presumed UTI, UDS positive for benzo. Limited MRI but negative. Lumbar puncture 1/30 with negative meningitis/encephalitis, autoimmune encephalitis panel pending. Per daughter, rapid decline over the last 2 months.   PMH includes: DM, HTN, night muscle spasms.    PT Comments  Pt in bed upon arrival with soft wrist restraints and sitter present. Pt was agreeable to PT session. Worked on gait training and transfers in today's session. Pt was ModI for all mobility with increased time and decreased gait speed. Pt needed re-direction through session, but did not need any physical assist. Pt met 4/5 goals with the stair goal not being applicable at next level of care. Pt has no further acute PT needs as pt is ModI for mobility. Pt will benefit from further PT in next venue of care, but no further acute needs identified. PT will sign off. Pt will benefit from mobility specialist services while in the hospital to continue mobilizing. If further needs arise, please re-consult.      If plan is discharge home, recommend the following: A little help with walking and/or transfers;A little help with bathing/dressing/bathroom;Assistance with cooking/housework;Direct supervision/assist for financial management;Supervision due to cognitive status;Assist for transportation;Direct supervision/assist for medications management;Help with stairs or ramp for entrance   Can travel by private vehicle     Yes  Equipment Recommendations  Other (comment) (TBD at next venue)       Precautions / Restrictions Precautions Precautions: Fall Precaution Comments: soft wrist restraints, sitter Restrictions Weight Bearing Restrictions Per  Provider Order: No     Mobility  Bed Mobility Overal bed mobility: Independent   Transfers Overall transfer level: Modified independent Equipment used: None Transfers: Sit to/from Stand Sit to Stand: Modified independent (Device/Increase time)     Ambulation/Gait Ambulation/Gait assistance: Modified independent (Device/Increase time) Gait Distance (Feet): 500 Feet Assistive device: None Gait Pattern/deviations: Step-through pattern Gait velocity: decr     General Gait Details: no physical assist needed, ModI for decreased gait speed. Steady w/ no LOB.    Balance Overall balance assessment: Needs assistance Sitting-balance support: No upper extremity supported, Feet supported Sitting balance-Leahy Scale: Good     Standing balance support: No upper extremity supported Standing balance-Leahy Scale: Good     Cognition Arousal: Alert Behavior During Therapy: Restless, Impulsive Overall Cognitive Status: Difficult to assess Area of Impairment: Orientation, Attention, Memory, Safety/judgement, Awareness, Following commands, Problem solving  Orientation Level: Disoriented to, Place, Time, Situation Current Attention Level: Focused Memory: Decreased short-term memory Following Commands: Follows one step commands inconsistently, Follows one step commands with increased time Safety/Judgement: Decreased awareness of safety, Decreased awareness of deficits Awareness: Intellectual Problem Solving: Slow processing, Decreased initiation, Difficulty sequencing, Requires verbal cues, Requires tactile cues General Comments: oriented to self, needs redirection throughout session. Speaking continuously throughout session. Not able to answer questions           General Comments General comments (skin integrity, edema, etc.): VSS on RA, nurse present through session to help re-direct pt to task. Soft restraints donned at end of session      Pertinent Vitals/Pain Pain Assessment Pain  Assessment: No/denies pain     PT Goals (current goals can now be found in the care plan section) Acute Rehab PT Goals PT  Goal Formulation: Patient unable to participate in goal setting Time For Goal Achievement: 08/29/23 Potential to Achieve Goals: Fair Progress towards PT goals: Goals met/education completed, patient discharged from PT    Frequency    Min 1X/week       AM-PAC PT 6 Clicks Mobility   Outcome Measure  Help needed turning from your back to your side while in a flat bed without using bedrails?: None Help needed moving from lying on your back to sitting on the side of a flat bed without using bedrails?: None Help needed moving to and from a bed to a chair (including a wheelchair)?: None Help needed standing up from a chair using your arms (e.g., wheelchair or bedside chair)?: None Help needed to walk in hospital room?: None Help needed climbing 3-5 steps with a railing? : A Little 6 Click Score: 23    End of Session Equipment Utilized During Treatment: Gait belt Activity Tolerance: Patient tolerated treatment well Patient left: in bed;with call bell/phone within reach;with bed alarm set;with nursing/sitter in room;with restraints reapplied Nurse Communication: Mobility status PT Visit Diagnosis: Muscle weakness (generalized) (M62.81);Unsteadiness on feet (R26.81)     Time: 8595-8570 PT Time Calculation (min) (ACUTE ONLY): 25 min  Charges:    $Gait Training: 8-22 mins $Therapeutic Activity: 8-22 mins PT General Charges $$ ACUTE PT VISIT: 1 Visit                     Kate ORN, PT, DPT Secure Chat Preferred  Rehab Office [phone]   Kate BRAVO Wendolyn 08/24/2023, 3:48 PM

## 2023-08-25 DIAGNOSIS — F0390 Unspecified dementia without behavioral disturbance: Secondary | ICD-10-CM | POA: Diagnosis not present

## 2023-08-25 DIAGNOSIS — I10 Essential (primary) hypertension: Secondary | ICD-10-CM | POA: Diagnosis not present

## 2023-08-25 DIAGNOSIS — N3000 Acute cystitis without hematuria: Secondary | ICD-10-CM | POA: Diagnosis not present

## 2023-08-25 DIAGNOSIS — G9341 Metabolic encephalopathy: Secondary | ICD-10-CM | POA: Diagnosis not present

## 2023-08-25 LAB — GLUCOSE, CAPILLARY
Glucose-Capillary: 143 mg/dL — ABNORMAL HIGH (ref 70–99)
Glucose-Capillary: 113 mg/dL — ABNORMAL HIGH (ref 70–99)
Glucose-Capillary: 237 mg/dL — ABNORMAL HIGH (ref 70–99)
Glucose-Capillary: 113 mg/dL — ABNORMAL HIGH (ref 70–99)

## 2023-08-25 LAB — MISC LABCORP TEST (SEND OUT)

## 2023-08-25 NOTE — Progress Notes (Signed)
 PROGRESS NOTE  Shannon Barrett FMW:[id number] DOB: 19-Jul-1956   PCP: [facility]  Patient is from: Home  DOA: 08/12/2023 LOS: 13  Chief complaints Chief Complaint  Patient presents with   Altered Mental Status     Brief Narrative / Interim history: 68 y.o. F with PMH of DM-2 and HTN brought to ED due to confusion, visual and auditory hallucination.  Reportedly with known bedbugs and bites all over.  Per POA report to triage nurse, patient was not showering or taking her medications.  Patient does confused and only oriented to self on presentation.  In ED, slightly hypertensive.  Basic labs without significant finding.  UA with large LE and rare bacteria.  UDS positive for benzo.  Does not seem to prescription for benzo.  CT head and CXR without significant finding.  Patient was given IM Geodon  10 mg x 1, IM Haldol  2 mg x 1, Benadryl  25 mg p.o. x 1 and IV ceftriaxone .  Admission requested for acute encephalopathy in the setting of presumed UTI.   Patient continued to be confused and agitated at times.  Per discussion with patient's daughter, rapid cognitive decline over the last 2 months.  Neurology consulted and recommended MRI brain with and transferred to [facility] for further workup.  EEG negative for seizure or epilepsy.  MRI brain with/without contrast attempted but only diffusion sequence obtained and is reassuring.  CT chest/abdomen/pelvis did not show malignancy.  Bedside LP unsuccessful on 1/29.  She underwent fluoroscopy guided LP on 1/30.  CSF chemistry and cell count reassuring.  Meningitis and encephalitis panel negative.  Paraneoplastic/autoimmune panel pending.  Neurology following peripherally.  Therapy recommended SNF.  TOC working on ALF.  Difficult disposition due to ongoing need of one-to-one recruitment consultant and restraints.  Subjective: Seen and examined earlier this morning.  No major events overnight of this morning.  Sitting up in bed talking but  her speech doesn't make any sense.  Objective: Vitals:   08/24/23 0809 08/24/23 1613 08/25/23 0600 08/25/23 0918  BP: 128/73 135/77 (!) 143/84 (!) 153/93  Pulse: 99 (!) 101 95 (!) 106  Resp: 16 18 20 18   Temp: 97.9 F (36.6 C) 98.8 F (37.1 C) 98.4 F (36.9 C) 98.3 F (36.8 C)  TempSrc: Oral Oral Oral Oral  SpO2: 100% 100% 100% 100%  Weight:      Height:        Examination:  GENERAL: No apparent distress.  Nontoxic.  HEENT: MMM.  Vision and hearing grossly intact.  NECK: Supple.  No apparent JVD.  RESP:  No IWOB.  Fair aeration bilaterally. CVS:  RRR. Heart sounds normal.  ABD/GI/GU: BS+. Abd soft, NTND.  MSK/EXT:  Moves extremities. No apparent deformity. No edema.  Bilateral wrist restraints. SKIN: no apparent skin lesion or wound NEURO: Sleepy but wakes to voice.  Oriented to self.  Follows commands.  No apparent focal neuro deficit  PSYCH: No distress or agitation.  Talking about his speech does not make any sense.  Procedures:  None  Microbiology summarized: Urine culture with multiple species. CSF culture NGTD  Assessment and plan: Confusion/agitation/audiovisual hallucination: Suspect undiagnosed major cognitive impairment with rapid decline.   Daughter noted some cognitive decline over the last 1 year that has progressed quickly in the last 2 months.  She was confused, hallucinating, wandering off and not caring for himself.  At times, she does not even recognize her daughter.  She does not take her regular meds either.  Has  no PCP but goes to health department times. UDS positive for benzo without prescription.  UA with pyuria but she does not have fever or leukocytosis.  No suprapubic tenderness either.  CTH, RPR, CRP, HIV, TSH, B6, A1, folate and B12 within normal.  EEG with mild diffuse encephalopathy but no seizure or epileptiform discharge.  MRI brain as above.  CT chest/abdomen/pelvis negative for malignancy. Agitation seems to have resolved with low-dose  Zyprexa . S/p fluoroscopy guided LMP on 1/30.  CSF chemistry, cell count and cultures NGTD.  Meningitis and encephalitis panel negative.  -S/p high-dose thiamine , B12 injection.   -Continue p.o. B12, B1, and vitamin D . -Completed antibiotic course for possible UTI/pyuria. -Follow paraneoplastic panel from CSF -Continue delirium precaution -Appreciate psychiatry input-continue low-dose Zyprexa .  IV Haldol  as needed.  QTc 426. -Continue one-to-one safety sitter and soft restraints.  High risk for fall and injury. -Neurology following peripherally.  Pyuria: Unclear if this is UTI or just pyuria.  She has no fever, leukocytosis or suprapubic tenderness.  Urine culture with multiple species.  Received ceftriaxone  from 1/24-1/27.  AKI: Likely prerenal from poor p.o. intake, lisinopril  and HCTZ.  AKI improved. Recent Labs    08/12/23 1839 08/13/23 0622 08/14/23 0610 08/15/23 0601 08/16/23 9356 08/17/23 0737 08/18/23 0521 08/20/23 0600 08/24/23 0632  BUN 14 14 12  27* 28* 29* 19 33* 32*  CREATININE 0.89 0.81 1.11* 1.36* 1.11* 1.23* 1.02* 1.06* 1.17*  -Lisinopril /HCTZ discontinued on 1/27 -Monitor renal function intermittently. -Ensure hydration.  May need intermittent boluses.   NIDDM-2: On metformin  at home but does not take.  A1c 6.2%. Recent Labs  Lab 08/23/23 1558 08/24/23 0806 08/24/23 1607 08/25/23 0650 08/25/23 1227  GLUCAP 153* 140* 177* 143* 113*  -Decrease SSI to very sensitive.  Essential hypertension: Blood pressure seems to fluctuate likely technical -Discontinued lisinopril  and HCTZ due to rising creatinine. -Continue amlodipine  10 mg daily -P.o. hydralazine  as needed  Microcytic anemia: Stable.  Anemia panel with mild iron deficiency.  B12 305. Recent Labs    08/12/23 1839 08/13/23 0622 08/14/23 0610 08/15/23 0601 08/16/23 9356 08/17/23 0737 08/18/23 0521 08/20/23 0600 08/24/23 0632  HGB 12.4 11.9* 13.3 13.3 12.6 12.9 14.4 13.0 13.3  -B12  supplementation as above -Continue monitoring  Physical deconditioning -PT/OT recommended SNF.  Difficult disposition due to ongoing need for safety sitter and restraints.  Bedbug infestation -Continue contact precaution  Advance care planning: Discussed CODE STATUS with patient's daughter.  Patient is full code.  Body mass index is 26.17 kg/m.  Pressure skin injury: Not POA Pressure Injury 08/21/23 Elbow Left;Posterior Stage 2 -  Partial thickness loss of dermis presenting as a shallow open injury with a red, pink wound bed without slough. (Active)  08/21/23 1346  Location: Elbow  Location Orientation: Left;Posterior  Staging: Stage 2 -  Partial thickness loss of dermis presenting as a shallow open injury with a red, pink wound bed without slough.  Wound Description (Comments):   Present on Admission: No  Dressing Type Foam - Lift dressing to assess site every shift 08/23/23 2158     Pressure Injury 08/21/23 Elbow Posterior;Right Stage 2 -  Partial thickness loss of dermis presenting as a shallow open injury with a red, pink wound bed without slough. (Active)  08/21/23 1348  Location: Elbow  Location Orientation: Posterior;Right  Staging: Stage 2 -  Partial thickness loss of dermis presenting as a shallow open injury with a red, pink wound bed without slough.  Wound Description (Comments):   Present on  Admission: No  Dressing Type Foam - Lift dressing to assess site every shift 08/23/23 2158   DVT prophylaxis:  SCDs Start: 08/13/23 0543  Code Status: Full code Family Communication: None at bedside today. Level of care: Med-Surg Status is: Inpatient Remains inpatient appropriate because: Agitation/psychosis/confusion, AKI and encephalopathy   Final disposition: ALF Consultants:  Neurology Psychiatry  35 minutes with more than 50% spent in reviewing records, counseling patient/family and coordinating care.   Sch Meds:  Scheduled Meds:  amLODipine   10 mg Oral Daily    cholecalciferol   2,000 Units Oral Daily   vitamin B-12  1,000 mcg Oral Daily   feeding supplement  237 mL Oral BID BM   insulin  aspart  0-6 Units Subcutaneous TID WC   OLANZapine   2.5 mg Oral q AM   And   OLANZapine   5 mg Oral QHS   Continuous Infusions:    PRN Meds:.acetaminophen  **OR** acetaminophen , haloperidol , haloperidol  lactate **OR** haloperidol  lactate, hydrALAZINE , ondansetron  **OR** ondansetron  (ZOFRAN ) IV, mouth rinse  Antimicrobials: Anti-infectives (From admission, onward)    Start     Dose/Rate Route Frequency Ordered Stop   08/13/23 1000  cefTRIAXone  (ROCEPHIN ) 1 g in sodium chloride  0.9 % 100 mL IVPB  Status:  Discontinued        1 g 200 mL/hr over 30 Minutes Intravenous Every 24 hours 08/13/23 0530 08/15/23 1424   08/12/23 2300  cefTRIAXone  (ROCEPHIN ) 2 g in sodium chloride  0.9 % 100 mL IVPB        2 g 200 mL/hr over 30 Minutes Intravenous  Once 08/12/23 2245 08/13/23 0018        I have personally reviewed the following labs and images: CBC: Recent Labs  Lab 08/20/23 0600 08/24/23 0632  WBC 6.4 5.9  HGB 13.0 13.3  HCT 37.4 38.1  MCV 76.2* 76.2*  PLT 259 226   BMP &GFR Recent Labs  Lab 08/20/23 0600 08/24/23 0632  NA 139 138  K 3.6 4.2  CL 105 101  CO2 23 25  GLUCOSE 102* 122*  BUN 33* 32*  CREATININE 1.06* 1.17*  CALCIUM 9.3 9.5  MG 2.2 2.2  PHOS 3.4 4.0   Estimated Creatinine Clearance: 41.8 mL/min (A) (by C-G formula based on SCr of 1.17 mg/dL (H)). Liver & Pancreas: Recent Labs  Lab 08/20/23 0600 08/24/23 0632  ALBUMIN 3.4* 3.2*   No results for input(s): LIPASE, AMYLASE in the last 168 hours. No results for input(s): AMMONIA in the last 168 hours. Diabetic: No results for input(s): HGBA1C in the last 72 hours.  Recent Labs  Lab 08/23/23 1558 08/24/23 0806 08/24/23 1607 08/25/23 0650 08/25/23 1227  GLUCAP 153* 140* 177* 143* 113*   Cardiac Enzymes: No results for input(s): CKTOTAL, CKMB, CKMBINDEX,  TROPONINI in the last 168 hours. No results for input(s): PROBNP in the last 8760 hours. Coagulation Profile: No results for input(s): INR, PROTIME in the last 168 hours. Thyroid Function Tests: No results for input(s): TSH, T4TOTAL, FREET4, T3FREE, THYROIDAB in the last 72 hours.  Lipid Profile: No results for input(s): CHOL, HDL, LDLCALC, TRIG, CHOLHDL, LDLDIRECT in the last 72 hours. Anemia Panel: No results for input(s): VITAMINB12, FOLATE, FERRITIN, TIBC, IRON, RETICCTPCT in the last 72 hours.  Urine analysis:    Component Value Date/Time   COLORURINE YELLOW 08/12/2023 1752   APPEARANCEUR HAZY (A) 08/12/2023 1752   LABSPEC 1.021 08/12/2023 1752   PHURINE 6.0 08/12/2023 1752   GLUCOSEU NEGATIVE 08/12/2023 1752   HGBUR NEGATIVE 08/12/2023 1752  BILIRUBINUR NEGATIVE 08/12/2023 1752   KETONESUR NEGATIVE 08/12/2023 1752   PROTEINUR 30 (A) 08/12/2023 1752   NITRITE NEGATIVE 08/12/2023 1752   LEUKOCYTESUR LARGE (A) 08/12/2023 1752   Sepsis Labs: Invalid input(s): PROCALCITONIN, LACTICIDVEN  Microbiology: Recent Results (from the past 240 hours)  CSF culture w Gram Stain     Status: None   Collection Time: 08/18/23 10:34 AM   Specimen: PATH Cytology CSF; Cerebrospinal Fluid  Result Value Ref Range Status   Specimen Description CSF  Final   Special Requests NONE  Final   Gram Stain   Final    WBC PRESENT,BOTH PMN AND MONONUCLEAR NO ORGANISMS SEEN CYTOSPIN SMEAR    Culture   Final    NO GROWTH 3 DAYS Performed at [facility], [address] [place], [place] [address]    Report Status 08/21/2023 FINAL  Final    Radiology Studies: No results found.      Euriah Matlack T. Minahil Quinlivan [facility]  If 7PM-7AM, please contact night-coverage [email or web address] 08/25/2023, 3:29 PM

## 2023-08-25 NOTE — Plan of Care (Signed)
 Serum paraneoplastic antibody panel is negative for all antibodies tested: Anti-Hu Ab                     Negative                  .  Anti-Ri Ab                     Negative                   Antineuronal nuclear Ab Type 3 Negative                    PCA Type-1 (Anti-Yo) Ab        Negative                   Purkinje Cell Cyto Ab Type 2   Negative                  .  Purkinje Cell Cyto Ab Type Tr  Negative                   Amphiphysin Antibody           Negative                   CRMP-5 IgG                     Negative                   AGNA-1                         Negative                    DPPX Antibody                  Negative                   mGluR1 Antibody                Negative                  IgLON5 Antibody                Negative                  .  Ma2/Ta Antibody                Negative                  .  Zic4 Antibody                  Negative                   DNER Antibody                  Negative                   ITPR1 Antibody                 Negative                   AMPA-R1 Antibody               Negative  AMPA-R2 Antibody               Negative                   GABA-B-R Antibody              Negative                   NMDA-R Antibody                Negative                 .  GAD65 Antibody                 Negative                  CASPR2 Antibody,Cell-based IFA Negative                 .  LGI1 Antibody, Cell-based IFA  Negative                    CSF paraneoplastic panel is pending.   Electronically signed: Dr. Darnette Lampron

## 2023-08-25 NOTE — TOC Progression Note (Signed)
 Transition of Care [facility]) - Progression Note    Patient Details  Name: Shannon Barrett MRN: [id number] Date of Birth: 12-Mar-1956  [facility] CM/SW Contact  Harlene Sharps, LCSW Phone Number: 08/25/2023, 2:27 PM  Clinical Narrative:    CSW continues to follow pt progression. Pt with decline in mentation and requiring SNF/MC placement. CSW working with [employer] for possible placement, they are reviewing. Family aware of plan and barriers. Pt still requiring restraints for safety at this time. TOC will continue to follow.    Expected Discharge Plan: Assisted Living Barriers to Discharge: Continued Medical Work up, English As A Second Language Teacher, Inadequate or no insurance, Unsafe home situation, Other (must enter comment) (MC/ALF placement)  Expected Discharge Plan and Services In-house Referral: Clinical Social Work   Post Acute Care Choice:  (ALF/ MC) Living arrangements for the past 2 months: Apartment                                       Social Determinants of Health (SDOH) Interventions SDOH Screenings   Food Insecurity: Patient Unable To Answer (08/13/2023)  Housing: Patient Unable To Answer (08/13/2023)  Transportation Needs: Patient Unable To Answer (08/13/2023)  Utilities: Patient Unable To Answer (08/13/2023)  Social Connections: Unknown (08/13/2023)  Tobacco Use: Low Risk  (08/12/2023)    Readmission Risk Interventions     No data to display

## 2023-08-26 DIAGNOSIS — F0390 Unspecified dementia without behavioral disturbance: Secondary | ICD-10-CM | POA: Diagnosis not present

## 2023-08-26 DIAGNOSIS — N3000 Acute cystitis without hematuria: Secondary | ICD-10-CM | POA: Diagnosis not present

## 2023-08-26 DIAGNOSIS — I10 Essential (primary) hypertension: Secondary | ICD-10-CM | POA: Diagnosis not present

## 2023-08-26 DIAGNOSIS — G9341 Metabolic encephalopathy: Secondary | ICD-10-CM | POA: Diagnosis not present

## 2023-08-26 LAB — GLUCOSE, CAPILLARY
Glucose-Capillary: 104 mg/dL — ABNORMAL HIGH (ref 70–99)
Glucose-Capillary: 92 mg/dL (ref 70–99)
Glucose-Capillary: 121 mg/dL — ABNORMAL HIGH (ref 70–99)
Glucose-Capillary: 157 mg/dL — ABNORMAL HIGH (ref 70–99)

## 2023-08-26 NOTE — Plan of Care (Signed)
 CSF paraneoplastic panel has resulted and is negative for all antibodies tested:       Electronically signed: Dr. Stephanine Reas

## 2023-08-26 NOTE — Progress Notes (Signed)
 PROGRESS NOTE  Shannon Barrett FMW:[id number] DOB: 14-Dec-1955   PCP: [facility]  Patient is from: Home  DOA: 08/12/2023 LOS: 14  Chief complaints Chief Complaint  Patient presents with   Altered Mental Status     Brief Narrative / Interim history: 68 y.o. F with PMH of DM-2 and HTN brought to ED due to confusion, visual and auditory hallucination.  Reportedly with known bedbugs and bites all over.  Per POA report to triage nurse, patient was not showering or taking her medications.  Patient does confused and only oriented to self on presentation.  In ED, slightly hypertensive.  Basic labs without significant finding.  UA with large LE and rare bacteria.  UDS positive for benzo.  Does not seem to prescription for benzo.  CT head and CXR without significant finding.  Patient was given IM Geodon  10 mg x 1, IM Haldol  2 mg x 1, Benadryl  25 mg p.o. x 1 and IV ceftriaxone .  Admission requested for acute encephalopathy in the setting of presumed UTI.   Patient continued to be confused and agitated at times.  Per discussion with patient's daughter, rapid cognitive decline over the last 2 months.  Neurology consulted and recommended MRI brain with and transferred to [facility] for further workup.  EEG negative for seizure or epilepsy.  MRI brain with/without contrast attempted but only diffusion sequence obtained and is reassuring.  CT chest/abdomen/pelvis did not show malignancy.  Bedside LP unsuccessful on 1/29.  She underwent fluoroscopy guided LP on 1/30.  CSF studies including chemistry, cell count, meningitis, encephalopathy test and paraneoplastic panel negative. Paraneoplastic/autoimmune panel pending.  Neurology signed off.  Therapy recommended SNF.  TOC working on ALF.  Difficult disposition due to ongoing need of one-to-one recruitment consultant and restraints.  Subjective: Seen and examined earlier this morning.  No major events overnight of this morning.  Sleeping  quietly.  Objective: Vitals:   08/25/23 0918 08/25/23 1627 08/25/23 2105 08/26/23 0742  BP: (!) 153/93 135/68 (!) 136/120 119/68  Pulse: (!) 106 100 (!) 110 77  Resp: 18 17 20 16   Temp: 98.3 F (36.8 C) 98.4 F (36.9 C) 98.3 F (36.8 C) (!) 97.5 F (36.4 C)  TempSrc: Oral Oral Oral Oral  SpO2: 100% 100% 96% 100%  Weight:      Height:        Examination:  GENERAL: No apparent distress.  Nontoxic.  HEENT: MMM.  Vision and hearing grossly intact.  NECK: Supple.  No apparent JVD.  RESP:  No IWOB.  Fair aeration bilaterally. CVS:  RRR. Heart sounds normal.  ABD/GI/GU: BS+. Abd soft, NTND.  MSK/EXT:  Moves extremities. No apparent deformity. No edema.  Bilateral wrist restraints. SKIN: no apparent skin lesion or wound NEURO: Sleepy but wakes to voice.  Oriented to self.  Follows commands.  No apparent focal neuro deficit  PSYCH: No distress or agitation but fidgeting with restraints..   Procedures:  None  Microbiology summarized: Urine culture with multiple species. CSF culture NGTD  Assessment and plan: Confusion/agitation/audiovisual hallucination: Suspect undiagnosed major cognitive impairment with rapid decline.   Daughter noted some cognitive decline over the last 1 year that has progressed quickly in the last 2 months.  She was confused, hallucinating, wandering off and not caring for himself.  At times, she does not even recognize her daughter.  She does not take her regular meds either.  Has no PCP but goes to health department times. UDS positive for benzo without prescription.  UA with pyuria but she does not have fever or leukocytosis.  No suprapubic tenderness either.  CTH, RPR, CRP, HIV, TSH, B6, A1, folate and B12 within normal.  EEG with mild diffuse encephalopathy but no seizure or epileptiform discharge.  MRI brain as above.  CT chest/abdomen/pelvis negative for malignancy. Agitation seems to have resolved with low-dose Zyprexa . S/p fluoroscopy guided LMP on 1/30.   CSF studies including chemistry, cell count, culture, meningitis, encephalitis and paraneoplastic panels negative. -S/p high-dose thiamine , B12 injection.   -Continue p.o. B12, B1, and vitamin D . -Completed antibiotic course for possible UTI/pyuria. -Continue delirium precaution -Appreciate psychiatry input-continue low-dose Zyprexa .  IV Haldol  as needed.  QTc 426. -Continue one-to-one safety sitter and soft restraints.  High risk for fall and injury. -Neurology following peripherally.  Pyuria: Unclear if this is UTI or just pyuria.  She has no fever, leukocytosis or suprapubic tenderness.  Urine culture with multiple species.  Received ceftriaxone  from 1/24-1/27.  AKI: Likely prerenal from poor p.o. intake, lisinopril  and HCTZ.  AKI improved. Recent Labs    08/12/23 1839 08/13/23 0622 08/14/23 0610 08/15/23 0601 08/16/23 9356 08/17/23 0737 08/18/23 0521 08/20/23 0600 08/24/23 0632  BUN 14 14 12  27* 28* 29* 19 33* 32*  CREATININE 0.89 0.81 1.11* 1.36* 1.11* 1.23* 1.02* 1.06* 1.17*  -Lisinopril /HCTZ discontinued on 1/27 -Monitor renal function intermittently. -Ensure hydration.  May need intermittent boluses.   NIDDM-2: On metformin  at home but does not take.  A1c 6.2%. Recent Labs  Lab 08/25/23 1227 08/25/23 1627 08/25/23 2103 08/26/23 0756 08/26/23 1147  GLUCAP 113* 237* 113* 104* 92  -Decrease SSI to very sensitive.  Essential hypertension: Blood pressure seems to fluctuate likely technical -Discontinued lisinopril  and HCTZ due to rising creatinine. -Continue amlodipine  10 mg daily -P.o. hydralazine  as needed  Microcytic anemia: Stable.  Anemia panel with mild iron deficiency.  B12 305. Recent Labs    08/12/23 1839 08/13/23 0622 08/14/23 0610 08/15/23 0601 08/16/23 9356 08/17/23 0737 08/18/23 0521 08/20/23 0600 08/24/23 0632  HGB 12.4 11.9* 13.3 13.3 12.6 12.9 14.4 13.0 13.3  -B12 supplementation as above -Continue monitoring  Physical  deconditioning -PT/OT recommended SNF.  Difficult disposition due to ongoing need for safety sitter and restraints.  Bedbug infestation -Continue contact precaution  Advance care planning: Discussed CODE STATUS with patient's daughter.  Patient is full code.  Body mass index is 26.17 kg/m.  Pressure skin injury: Not POA Pressure Injury 08/21/23 Elbow Left;Posterior Stage 2 -  Partial thickness loss of dermis presenting as a shallow open injury with a red, pink wound bed without slough. (Active)  08/21/23 1346  Location: Elbow  Location Orientation: Left;Posterior  Staging: Stage 2 -  Partial thickness loss of dermis presenting as a shallow open injury with a red, pink wound bed without slough.  Wound Description (Comments):   Present on Admission: No  Dressing Type Foam - Lift dressing to assess site every shift 08/23/23 2158     Pressure Injury 08/21/23 Elbow Posterior;Right Stage 2 -  Partial thickness loss of dermis presenting as a shallow open injury with a red, pink wound bed without slough. (Active)  08/21/23 1348  Location: Elbow  Location Orientation: Posterior;Right  Staging: Stage 2 -  Partial thickness loss of dermis presenting as a shallow open injury with a red, pink wound bed without slough.  Wound Description (Comments):   Present on Admission: No  Dressing Type Foam - Lift dressing to assess site every shift 08/23/23 2158   DVT prophylaxis:  SCDs Start: 08/13/23 0543  Code Status: Full code Family Communication: None at bedside today. Level of care: Med-Surg Status is: Inpatient Remains inpatient appropriate because: Agitation/psychosis/confusion, AKI and encephalopathy   Final disposition: ALF Consultants:  Neurology Psychiatry  35 minutes with more than 50% spent in reviewing records, counseling patient/family and coordinating care.   Sch Meds:  Scheduled Meds:  amLODipine   10 mg Oral Daily   cholecalciferol   2,000 Units Oral Daily   vitamin B-12   1,000 mcg Oral Daily   feeding supplement  237 mL Oral BID BM   insulin  aspart  0-6 Units Subcutaneous TID WC   OLANZapine   2.5 mg Oral q AM   And   OLANZapine   5 mg Oral QHS   Continuous Infusions:    PRN Meds:.acetaminophen  **OR** acetaminophen , haloperidol , haloperidol  lactate **OR** haloperidol  lactate, hydrALAZINE , ondansetron  **OR** ondansetron  (ZOFRAN ) IV, mouth rinse  Antimicrobials: Anti-infectives (From admission, onward)    Start     Dose/Rate Route Frequency Ordered Stop   08/13/23 1000  cefTRIAXone  (ROCEPHIN ) 1 g in sodium chloride  0.9 % 100 mL IVPB  Status:  Discontinued        1 g 200 mL/hr over 30 Minutes Intravenous Every 24 hours 08/13/23 0530 08/15/23 1424   08/12/23 2300  cefTRIAXone  (ROCEPHIN ) 2 g in sodium chloride  0.9 % 100 mL IVPB        2 g 200 mL/hr over 30 Minutes Intravenous  Once 08/12/23 2245 08/13/23 0018        I have personally reviewed the following labs and images: CBC: Recent Labs  Lab 08/20/23 0600 08/24/23 0632  WBC 6.4 5.9  HGB 13.0 13.3  HCT 37.4 38.1  MCV 76.2* 76.2*  PLT 259 226   BMP &GFR Recent Labs  Lab 08/20/23 0600 08/24/23 0632  NA 139 138  K 3.6 4.2  CL 105 101  CO2 23 25  GLUCOSE 102* 122*  BUN 33* 32*  CREATININE 1.06* 1.17*  CALCIUM 9.3 9.5  MG 2.2 2.2  PHOS 3.4 4.0   Estimated Creatinine Clearance: 41.8 mL/min (A) (by C-G formula based on SCr of 1.17 mg/dL (H)). Liver & Pancreas: Recent Labs  Lab 08/20/23 0600 08/24/23 0632  ALBUMIN 3.4* 3.2*   No results for input(s): LIPASE, AMYLASE in the last 168 hours. No results for input(s): AMMONIA in the last 168 hours. Diabetic: No results for input(s): HGBA1C in the last 72 hours.  Recent Labs  Lab 08/25/23 1227 08/25/23 1627 08/25/23 2103 08/26/23 0756 08/26/23 1147  GLUCAP 113* 237* 113* 104* 92   Cardiac Enzymes: No results for input(s): CKTOTAL, CKMB, CKMBINDEX, TROPONINI in the last 168 hours. No results for input(s):  PROBNP in the last 8760 hours. Coagulation Profile: No results for input(s): INR, PROTIME in the last 168 hours. Thyroid Function Tests: No results for input(s): TSH, T4TOTAL, FREET4, T3FREE, THYROIDAB in the last 72 hours.  Lipid Profile: No results for input(s): CHOL, HDL, LDLCALC, TRIG, CHOLHDL, LDLDIRECT in the last 72 hours. Anemia Panel: No results for input(s): VITAMINB12, FOLATE, FERRITIN, TIBC, IRON, RETICCTPCT in the last 72 hours.  Urine analysis:    Component Value Date/Time   COLORURINE YELLOW 08/12/2023 1752   APPEARANCEUR HAZY (A) 08/12/2023 1752   LABSPEC 1.021 08/12/2023 1752   PHURINE 6.0 08/12/2023 1752   GLUCOSEU NEGATIVE 08/12/2023 1752   HGBUR NEGATIVE 08/12/2023 1752   BILIRUBINUR NEGATIVE 08/12/2023 1752   KETONESUR NEGATIVE 08/12/2023 1752   PROTEINUR 30 (A) 08/12/2023 1752   NITRITE NEGATIVE  08/12/2023 1752   LEUKOCYTESUR LARGE (A) 08/12/2023 1752   Sepsis Labs: Invalid input(s): PROCALCITONIN, LACTICIDVEN  Microbiology: Recent Results (from the past 240 hours)  CSF culture w Gram Stain     Status: None   Collection Time: 08/18/23 10:34 AM   Specimen: PATH Cytology CSF; Cerebrospinal Fluid  Result Value Ref Range Status   Specimen Description CSF  Final   Special Requests NONE  Final   Gram Stain   Final    WBC PRESENT,BOTH PMN AND MONONUCLEAR NO ORGANISMS SEEN CYTOSPIN SMEAR    Culture   Final    NO GROWTH 3 DAYS Performed at [facility], [address] [place], [place] [address]    Report Status 08/21/2023 FINAL  Final    Radiology Studies: No results found.      Shann Merrick T. Jazmon Kos [facility]  If 7PM-7AM, please contact night-coverage [email or web address] 08/26/2023, 3:24 PM

## 2023-08-27 DIAGNOSIS — I10 Essential (primary) hypertension: Secondary | ICD-10-CM | POA: Diagnosis not present

## 2023-08-27 DIAGNOSIS — F0390 Unspecified dementia without behavioral disturbance: Secondary | ICD-10-CM | POA: Diagnosis not present

## 2023-08-27 DIAGNOSIS — N3000 Acute cystitis without hematuria: Secondary | ICD-10-CM | POA: Diagnosis not present

## 2023-08-27 DIAGNOSIS — G9341 Metabolic encephalopathy: Secondary | ICD-10-CM | POA: Diagnosis not present

## 2023-08-27 LAB — GLUCOSE, CAPILLARY
Glucose-Capillary: 108 mg/dL — ABNORMAL HIGH (ref 70–99)
Glucose-Capillary: 113 mg/dL — ABNORMAL HIGH (ref 70–99)
Glucose-Capillary: 110 mg/dL — ABNORMAL HIGH (ref 70–99)
Glucose-Capillary: 152 mg/dL — ABNORMAL HIGH (ref 70–99)

## 2023-08-27 NOTE — Plan of Care (Signed)
  Problem: Health Behavior/Discharge Planning: Goal: Ability to manage health-related needs will improve 08/27/2023 2035 by Aaran Enberg K, RN Outcome: Progressing 08/27/2023 1935 by Cleon Signorelli K, RN Outcome: Progressing   Problem: Clinical Measurements: Goal: Ability to maintain clinical measurements within normal limits will improve 08/27/2023 2035 by Ellanore Vanhook K, RN Outcome: Progressing 08/27/2023 1935 by Siobhan Zaro K, RN Outcome: Progressing Goal: Will remain free from infection 08/27/2023 2035 by Florian Dellis POUR, RN Outcome: Progressing 08/27/2023 1935 by Isacc Turney K, RN Outcome: Progressing Goal: Diagnostic test results will improve 08/27/2023 2035 by Tavie Haseman K, RN Outcome: Progressing 08/27/2023 1935 by Epifania Littrell K, RN Outcome: Progressing   Problem: Activity: Goal: Risk for activity intolerance will decrease 08/27/2023 2035 by Kathy Wahid K, RN Outcome: Progressing 08/27/2023 1935 by Florian Dellis POUR, RN Outcome: Progressing   Problem: Skin Integrity: Goal: Risk for impaired skin integrity will decrease 08/27/2023 2035 by Florian Dellis POUR, RN Outcome: Progressing 08/27/2023 1935 by Rudolph Dobler K, RN Outcome: Progressing   Problem: Safety: Goal: Non-violent Restraint(s) 08/27/2023 2035 by Florian Dellis POUR, RN Outcome: Progressing 08/27/2023 1935 by Cortavious Nix K, RN Outcome: Progressing   Problem: Education: Goal: Ability to describe self-care measures that may prevent or decrease complications (Diabetes Survival Skills Education) will improve 08/27/2023 2035 by Bawi Lakins K, RN Outcome: Progressing 08/27/2023 1935 by Charliene Inoue K, RN Outcome: Progressing Goal: Individualized Educational Video(s) 08/27/2023 2035 by Florian Dellis POUR, RN Outcome: Progressing 08/27/2023 1935 by Dayra Rapley K, RN Outcome: Progressing   Problem: Coping: Goal: Ability to adjust to condition or change in health will improve 08/27/2023 2035 by Florian Dellis POUR,  RN Outcome: Progressing 08/27/2023 1935 by Chrystel Barefield K, RN Outcome: Progressing   Problem: Fluid Volume: Goal: Ability to maintain a balanced intake and output will improve 08/27/2023 2035 by Florian Dellis POUR, RN Outcome: Progressing 08/27/2023 1935 by Rufus Beske K, RN Outcome: Progressing   Problem: Health Behavior/Discharge Planning: Goal: Ability to identify and utilize available resources and services will improve 08/27/2023 2035 by Florian Dellis POUR, RN Outcome: Progressing 08/27/2023 1935 by Mckenze Slone K, RN Outcome: Progressing Goal: Ability to manage health-related needs will improve 08/27/2023 2035 by Florian Dellis POUR, RN Outcome: Progressing 08/27/2023 1935 by Masa Lubin K, RN Outcome: Progressing   Problem: Metabolic: Goal: Ability to maintain appropriate glucose levels will improve 08/27/2023 2035 by Florian Dellis POUR, RN Outcome: Progressing 08/27/2023 1935 by Suzzette Gasparro K, RN Outcome: Progressing   Problem: Nutritional: Goal: Maintenance of adequate nutrition will improve 08/27/2023 2035 by Florian Dellis POUR, RN Outcome: Progressing 08/27/2023 1935 by Clorene Nerio K, RN Outcome: Progressing Goal: Progress toward achieving an optimal weight will improve 08/27/2023 2035 by Florian Dellis POUR, RN Outcome: Progressing 08/27/2023 1935 by Kiva Norland K, RN Outcome: Progressing   Problem: Skin Integrity: Goal: Risk for impaired skin integrity will decrease 08/27/2023 2035 by Diallo Ponder K, RN Outcome: Progressing 08/27/2023 1935 by Aizlynn Digilio K, RN Outcome: Progressing   Problem: Tissue Perfusion: Goal: Adequacy of tissue perfusion will improve 08/27/2023 2035 by Child Campoy K, RN Outcome: Progressing 08/27/2023 1935 by Tisheena Maguire K, RN Outcome: Progressing   Problem: Safety: Goal: Non-violent Restraint(s) 08/27/2023 2035 by Florian Dellis POUR, RN Outcome: Progressing 08/27/2023 1935 by Telly Jawad K, RN Outcome: Progressing

## 2023-08-27 NOTE — Plan of Care (Signed)
  Problem: Health Behavior/Discharge Planning: Goal: Ability to manage health-related needs will improve Outcome: Progressing   Problem: Clinical Measurements: Goal: Ability to maintain clinical measurements within normal limits will improve Outcome: Progressing Goal: Will remain free from infection Outcome: Progressing Goal: Diagnostic test results will improve Outcome: Progressing   Problem: Activity: Goal: Risk for activity intolerance will decrease Outcome: Progressing   Problem: Skin Integrity: Goal: Risk for impaired skin integrity will decrease Outcome: Progressing   Problem: Safety: Goal: Non-violent Restraint(s) Outcome: Progressing   Problem: Education: Goal: Ability to describe self-care measures that may prevent or decrease complications (Diabetes Survival Skills Education) will improve Outcome: Progressing Goal: Individualized Educational Video(s) Outcome: Progressing   Problem: Coping: Goal: Ability to adjust to condition or change in health will improve Outcome: Progressing   Problem: Fluid Volume: Goal: Ability to maintain a balanced intake and output will improve Outcome: Progressing   Problem: Health Behavior/Discharge Planning: Goal: Ability to identify and utilize available resources and services will improve Outcome: Progressing Goal: Ability to manage health-related needs will improve Outcome: Progressing   Problem: Metabolic: Goal: Ability to maintain appropriate glucose levels will improve Outcome: Progressing   Problem: Nutritional: Goal: Maintenance of adequate nutrition will improve Outcome: Progressing Goal: Progress toward achieving an optimal weight will improve Outcome: Progressing   Problem: Skin Integrity: Goal: Risk for impaired skin integrity will decrease Outcome: Progressing   Problem: Tissue Perfusion: Goal: Adequacy of tissue perfusion will improve Outcome: Progressing   Problem: Safety: Goal: Non-violent  Restraint(s) Outcome: Progressing

## 2023-08-27 NOTE — Plan of Care (Signed)
  Problem: Activity: Goal: Risk for activity intolerance will decrease Outcome: Progressing   Problem: Skin Integrity: Goal: Risk for impaired skin integrity will decrease Outcome: Progressing   Problem: Safety: Goal: Non-violent Restraint(s) Outcome: Progressing   Problem: Nutritional: Goal: Maintenance of adequate nutrition will improve Outcome: Progressing   Problem: Tissue Perfusion: Goal: Adequacy of tissue perfusion will improve Outcome: Progressing

## 2023-08-27 NOTE — Progress Notes (Signed)
 PROGRESS NOTE  Shannon Barrett FMW:[id number] DOB: [birth year]   PCP: Health, [facility]  Patient is from: Home  DOA: 08/12/2023 LOS: 15  Chief complaints Chief Complaint  Patient presents with   Altered Mental Status     Brief Narrative / Interim history: 68 y.o. F with PMH of DM-2 and HTN brought to ED due to confusion, visual and auditory hallucination.  Reportedly with known bedbugs and bites all over.  Per POA report to triage nurse, patient was not showering or taking her medications.  Patient does confused and only oriented to self on presentation.  In ED, slightly hypertensive.  Basic labs without significant finding.  UA with large LE and rare bacteria.  UDS positive for benzo.  Does not seem to prescription for benzo.  CT head and CXR without significant finding.  Patient was given IM Geodon  10 mg x 1, IM Haldol  2 mg x 1, Benadryl  25 mg p.o. x 1 and IV ceftriaxone .  Admission requested for acute encephalopathy in the setting of presumed UTI.   Patient continued to be confused and agitated at times.  Per discussion with patient's daughter, rapid cognitive decline over the last 2 months.  Neurology consulted and recommended MRI brain with and transferred to [facility] for further workup.  EEG negative for seizure or epilepsy.  MRI brain with/without contrast attempted but only diffusion sequence obtained and is reassuring.  CT chest/abdomen/pelvis did not show malignancy.  Bedside LP unsuccessful on 1/29.  She underwent fluoroscopy guided LP on 1/30.  CSF studies including chemistry, cell count, meningitis, encephalopathy test and paraneoplastic panel negative. Paraneoplastic/autoimmune panel pending.  Neurology signed off.  Therapy recommended SNF.  TOC working on ALF.  Difficult disposition due to ongoing need of one-to-one recruitment consultant and restraints.  Subjective: Seen and examined earlier this morning.  No major events overnight of this morning.  Per nurse stable,  she was up early and ate breakfast.  She is currently sleeping.  Objective: Vitals:   08/26/23 1922 08/27/23 0414 08/27/23 0802 08/27/23 1100  BP: 128/65 (!) 144/88 110/70 110/70  Pulse: 100 94 (!) 106   Resp: 18 20 20    Temp: 98.2 F (36.8 C) 98 F (36.7 C) 99.2 F (37.3 C)   TempSrc: Oral     SpO2: 100% 97% 100%   Weight:      Height:        Examination:  GENERAL: No apparent distress.  Nontoxic.  HEENT: MMM.  Vision and hearing grossly intact.  NECK: Supple.  No apparent JVD.  RESP:  No IWOB.  Fair aeration bilaterally. CVS:  RRR. Heart sounds normal.  ABD/GI/GU: BS+. Abd soft, NTND.  MSK/EXT:  Moves extremities. No apparent deformity. No edema.  Bilateral wrist restraints. SKIN: no apparent skin lesion or wound NEURO: Sleepy quietly.  No apparent focal neuro deficit  PSYCH: No distress or agitation.  Sleeping.  Procedures:  None  Microbiology summarized: Urine culture with multiple species. CSF culture NGTD  Assessment and plan: Confusion/agitation/audiovisual hallucination: Suspect undiagnosed major cognitive impairment with rapid decline.   Daughter noted some cognitive decline over the last 1 year that has progressed quickly in the last 2 months.  She was confused, hallucinating, wandering off and not caring for himself.  At times, she does not even recognize her daughter.  She does not take her regular meds either.  Has no PCP but goes to health department times. UDS positive for benzo without prescription.  UA with pyuria but she  does not have fever or leukocytosis.  No suprapubic tenderness either.  CTH, RPR, CRP, HIV, TSH, B6, A1, folate and B12 within normal.  EEG with mild diffuse encephalopathy but no seizure or epileptiform discharge.  MRI brain as above.  CT chest/abdomen/pelvis negative for malignancy. Agitation seems to have resolved with low-dose Zyprexa . S/p fluoroscopy guided LMP on 1/30.  CSF studies including chemistry, cell count, culture, meningitis,  encephalitis and paraneoplastic panels negative. -S/p high-dose thiamine , B12 injection.   -Continue p.o. B12, B1, and vitamin D . -Completed antibiotic course for possible UTI/pyuria. -Continue delirium precaution -Appreciate psychiatry input-continue low-dose Zyprexa .  IV Haldol  as needed.  QTc 426. -Continue one-to-one safety sitter and soft restraints.  High risk for fall and injury. -Neurology following peripherally.  Pyuria: Unclear if this is UTI or just pyuria.  She has no fever, leukocytosis or suprapubic tenderness.  Urine culture with multiple species.  Received ceftriaxone  from 1/24-1/27.  AKI: Likely prerenal from poor p.o. intake, lisinopril  and HCTZ.  AKI improved. Recent Labs    08/12/23 1839 08/13/23 0622 08/14/23 0610 08/15/23 0601 08/16/23 9356 08/17/23 0737 08/18/23 0521 08/20/23 0600 08/24/23 0632  BUN 14 14 12  27* 28* 29* 19 33* 32*  CREATININE 0.89 0.81 1.11* 1.36* 1.11* 1.23* 1.02* 1.06* 1.17*  -Lisinopril /HCTZ discontinued on 1/27 -Monitor renal function intermittently. -Ensure hydration.  May need intermittent boluses.   NIDDM-2: On metformin  at home but does not take.  A1c 6.2%. Recent Labs  Lab 08/26/23 1147 08/26/23 1652 08/26/23 1919 08/27/23 0717 08/27/23 1132  GLUCAP 92 121* 157* 108* 113*  -Decrease SSI to very sensitive.  Essential hypertension: Blood pressure seems to fluctuate likely technical -Discontinued lisinopril  and HCTZ due to rising creatinine. -Continue amlodipine  10 mg daily -P.o. hydralazine  as needed  Microcytic anemia: Stable.  Anemia panel with mild iron deficiency.  B12 305. Recent Labs    08/12/23 1839 08/13/23 0622 08/14/23 0610 08/15/23 0601 08/16/23 9356 08/17/23 0737 08/18/23 0521 08/20/23 0600 08/24/23 0632  HGB 12.4 11.9* 13.3 13.3 12.6 12.9 14.4 13.0 13.3  -B12 supplementation as above -Continue monitoring  Physical deconditioning -PT/OT recommended SNF.  Difficult disposition due to ongoing need  for safety sitter and restraints.  Bedbug infestation -Continue contact precaution  Advance care planning: Discussed CODE STATUS with patient's daughter.  Patient is full code.  Body mass index is 26.17 kg/m.  Pressure skin injury: Not POA Pressure Injury 08/21/23 Elbow Left;Posterior Stage 2 -  Partial thickness loss of dermis presenting as a shallow open injury with a red, pink wound bed without slough. (Active)  08/21/23 1346  Location: Elbow  Location Orientation: Left;Posterior  Staging: Stage 2 -  Partial thickness loss of dermis presenting as a shallow open injury with a red, pink wound bed without slough.  Wound Description (Comments):   Present on Admission: No  Dressing Type Foam - Lift dressing to assess site every shift 08/23/23 2158     Pressure Injury 08/21/23 Elbow Posterior;Right Stage 2 -  Partial thickness loss of dermis presenting as a shallow open injury with a red, pink wound bed without slough. (Active)  08/21/23 1348  Location: Elbow  Location Orientation: Posterior;Right  Staging: Stage 2 -  Partial thickness loss of dermis presenting as a shallow open injury with a red, pink wound bed without slough.  Wound Description (Comments):   Present on Admission: No  Dressing Type Foam - Lift dressing to assess site every shift 08/23/23 2158   DVT prophylaxis:  SCDs Start: 08/13/23 0543  Code Status: Full code Family Communication: None at bedside today. Level of care: Med-Surg Status is: Inpatient Remains inpatient appropriate because: Agitation/psychosis/confusion, AKI and encephalopathy   Final disposition: ALF Consultants:  Neurology Psychiatry  25 minutes with more than 50% spent in reviewing records, counseling patient/family and coordinating care.   Sch Meds:  Scheduled Meds:  amLODipine   10 mg Oral Daily   cholecalciferol   2,000 Units Oral Daily   vitamin B-12  1,000 mcg Oral Daily   feeding supplement  237 mL Oral BID BM   insulin  aspart   0-6 Units Subcutaneous TID WC   OLANZapine   2.5 mg Oral q AM   And   OLANZapine   5 mg Oral QHS   Continuous Infusions:    PRN Meds:.acetaminophen  **OR** acetaminophen , haloperidol , haloperidol  lactate **OR** haloperidol  lactate, hydrALAZINE , ondansetron  **OR** ondansetron  (ZOFRAN ) IV, mouth rinse  Antimicrobials: Anti-infectives (From admission, onward)    Start     Dose/Rate Route Frequency Ordered Stop   08/13/23 1000  cefTRIAXone  (ROCEPHIN ) 1 g in sodium chloride  0.9 % 100 mL IVPB  Status:  Discontinued        1 g 200 mL/hr over 30 Minutes Intravenous Every 24 hours 08/13/23 0530 08/15/23 1424   08/12/23 2300  cefTRIAXone  (ROCEPHIN ) 2 g in sodium chloride  0.9 % 100 mL IVPB        2 g 200 mL/hr over 30 Minutes Intravenous  Once 08/12/23 2245 08/13/23 0018        I have personally reviewed the following labs and images: CBC: Recent Labs  Lab 08/24/23 0632  WBC 5.9  HGB 13.3  HCT 38.1  MCV 76.2*  PLT 226   BMP &GFR Recent Labs  Lab 08/24/23 0632  NA 138  K 4.2  CL 101  CO2 25  GLUCOSE 122*  BUN 32*  CREATININE 1.17*  CALCIUM 9.5  MG 2.2  PHOS 4.0   Estimated Creatinine Clearance: 41.8 mL/min (A) (by C-G formula based on SCr of 1.17 mg/dL (H)). Liver & Pancreas: Recent Labs  Lab 08/24/23 9367  ALBUMIN 3.2*   No results for input(s): LIPASE, AMYLASE in the last 168 hours. No results for input(s): AMMONIA in the last 168 hours. Diabetic: No results for input(s): HGBA1C in the last 72 hours.  Recent Labs  Lab 08/26/23 1147 08/26/23 1652 08/26/23 1919 08/27/23 0717 08/27/23 1132  GLUCAP 92 121* 157* 108* 113*   Cardiac Enzymes: No results for input(s): CKTOTAL, CKMB, CKMBINDEX, TROPONINI in the last 168 hours. No results for input(s): PROBNP in the last 8760 hours. Coagulation Profile: No results for input(s): INR, PROTIME in the last 168 hours. Thyroid Function Tests: No results for input(s): TSH, T4TOTAL, FREET4,  T3FREE, THYROIDAB in the last 72 hours.  Lipid Profile: No results for input(s): CHOL, HDL, LDLCALC, TRIG, CHOLHDL, LDLDIRECT in the last 72 hours. Anemia Panel: No results for input(s): VITAMINB12, FOLATE, FERRITIN, TIBC, IRON, RETICCTPCT in the last 72 hours.  Urine analysis:    Component Value Date/Time   COLORURINE YELLOW 08/12/2023 1752   APPEARANCEUR HAZY (A) 08/12/2023 1752   LABSPEC 1.021 08/12/2023 1752   PHURINE 6.0 08/12/2023 1752   GLUCOSEU NEGATIVE 08/12/2023 1752   HGBUR NEGATIVE 08/12/2023 1752   BILIRUBINUR NEGATIVE 08/12/2023 1752   KETONESUR NEGATIVE 08/12/2023 1752   PROTEINUR 30 (A) 08/12/2023 1752   NITRITE NEGATIVE 08/12/2023 1752   LEUKOCYTESUR LARGE (A) 08/12/2023 1752   Sepsis Labs: Invalid input(s): PROCALCITONIN, LACTICIDVEN  Microbiology: Recent Results (from the past 240 hours)  CSF culture w Gram Stain     Status: None   Collection Time: 08/18/23 10:34 AM   Specimen: PATH Cytology CSF; Cerebrospinal Fluid  Result Value Ref Range Status   Specimen Description CSF  Final   Special Requests NONE  Final   Gram Stain   Final    WBC PRESENT,BOTH PMN AND MONONUCLEAR NO ORGANISMS SEEN CYTOSPIN SMEAR    Culture   Final    NO GROWTH 3 DAYS Performed at [facility], [address], [place], [place] [address]    Report Status 08/21/2023 FINAL  Final    Radiology Studies: No results found.      Kalla Watson T. Artez Regis [facility]  If 7PM-7AM, please contact night-coverage [email or web address] 08/27/2023, 2:15 PM

## 2023-08-28 DIAGNOSIS — G9341 Metabolic encephalopathy: Secondary | ICD-10-CM | POA: Diagnosis not present

## 2023-08-28 DIAGNOSIS — I10 Essential (primary) hypertension: Secondary | ICD-10-CM | POA: Diagnosis not present

## 2023-08-28 DIAGNOSIS — N3000 Acute cystitis without hematuria: Secondary | ICD-10-CM | POA: Diagnosis not present

## 2023-08-28 DIAGNOSIS — F0390 Unspecified dementia without behavioral disturbance: Secondary | ICD-10-CM | POA: Diagnosis not present

## 2023-08-28 LAB — GLUCOSE, CAPILLARY
Glucose-Capillary: 95 mg/dL (ref 70–99)
Glucose-Capillary: 107 mg/dL — ABNORMAL HIGH (ref 70–99)
Glucose-Capillary: 101 mg/dL — ABNORMAL HIGH (ref 70–99)
Glucose-Capillary: 108 mg/dL — ABNORMAL HIGH (ref 70–99)
Glucose-Capillary: 196 mg/dL — ABNORMAL HIGH (ref 70–99)

## 2023-08-28 NOTE — Plan of Care (Signed)
  Problem: Health Behavior/Discharge Planning: Goal: Ability to manage health-related needs will improve Outcome: Progressing   Problem: Clinical Measurements: Goal: Ability to maintain clinical measurements within normal limits will improve Outcome: Progressing Goal: Will remain free from infection Outcome: Progressing Goal: Diagnostic test results will improve Outcome: Progressing   Problem: Activity: Goal: Risk for activity intolerance will decrease Outcome: Progressing   Problem: Skin Integrity: Goal: Risk for impaired skin integrity will decrease Outcome: Progressing   Problem: Safety: Goal: Non-violent Restraint(s) Outcome: Progressing   Problem: Education: Goal: Ability to describe self-care measures that may prevent or decrease complications (Diabetes Survival Skills Education) will improve Outcome: Progressing Goal: Individualized Educational Video(s) Outcome: Progressing   Problem: Coping: Goal: Ability to adjust to condition or change in health will improve Outcome: Progressing   Problem: Fluid Volume: Goal: Ability to maintain a balanced intake and output will improve Outcome: Progressing   Problem: Health Behavior/Discharge Planning: Goal: Ability to identify and utilize available resources and services will improve Outcome: Progressing Goal: Ability to manage health-related needs will improve Outcome: Progressing   Problem: Metabolic: Goal: Ability to maintain appropriate glucose levels will improve Outcome: Progressing   Problem: Nutritional: Goal: Maintenance of adequate nutrition will improve Outcome: Progressing Goal: Progress toward achieving an optimal weight will improve Outcome: Progressing   Problem: Skin Integrity: Goal: Risk for impaired skin integrity will decrease Outcome: Progressing   Problem: Tissue Perfusion: Goal: Adequacy of tissue perfusion will improve Outcome: Progressing   Problem: Safety: Goal: Non-violent  Restraint(s) Outcome: Progressing

## 2023-08-28 NOTE — Progress Notes (Signed)
 Patient resting in bed, no restraints required. Sitter @ bedside

## 2023-08-28 NOTE — Progress Notes (Signed)
 PROGRESS NOTE  Shannon Barrett FMW:[id number] DOB: [birth year]   PCP: Health, [facility]  Patient is from: Home  DOA: 08/12/2023 LOS: 16  Chief complaints Chief Complaint  Patient presents with   Altered Mental Status     Brief Narrative / Interim history: 68 y.o. F with PMH of DM-2 and HTN brought to ED due to confusion, visual and auditory hallucination.  Reportedly with known bedbugs and bites all over.  Per POA report to triage nurse, patient was not showering or taking her medications.  Patient does confused and only oriented to self on presentation.  In ED, slightly hypertensive.  Basic labs without significant finding.  UA with large LE and rare bacteria.  UDS positive for benzo.  Does not seem to prescription for benzo.  CT head and CXR without significant finding.  Patient was given IM Geodon  10 mg x 1, IM Haldol  2 mg x 1, Benadryl  25 mg p.o. x 1 and IV ceftriaxone .  Admission requested for acute encephalopathy in the setting of presumed UTI.   Patient continued to be confused and agitated at times.  Per discussion with patient's daughter, rapid cognitive decline over the last 2 months.  Neurology consulted and recommended MRI brain with and transferred to [facility] for further workup.  EEG negative for seizure or epilepsy.  MRI brain with/without contrast attempted but only diffusion sequence obtained and is reassuring.  CT chest/abdomen/pelvis did not show malignancy.  Bedside LP unsuccessful on 1/29.  She underwent fluoroscopy guided LP on 1/30.  CSF studies including chemistry, cell count, meningitis, encephalopathy test and paraneoplastic panel negative. Paraneoplastic/autoimmune panel pending.  Neurology signed off.  Therapy recommended SNF.  TOC working on ALF.  Difficult disposition due to ongoing need of one-to-one recruitment consultant and restraints.  Subjective: Seen and examined earlier this morning.  No major events overnight of this morning.  Sleeping quietly  without restraints.  Objective: Vitals:   08/27/23 1643 08/27/23 1907 08/28/23 0606 08/28/23 0848  BP: 110/63 116/66 (!) 105/53 (!) 110/53  Pulse: 90 (!) 105 84 83  Resp: 19 20 18 17   Temp: 98.2 F (36.8 C) 98.4 F (36.9 C) 97.9 F (36.6 C) 98.3 F (36.8 C)  TempSrc:  Oral Axillary Oral  SpO2: 98% 100% 100% 100%  Weight:      Height:        Examination:  GENERAL: No apparent distress.  Nontoxic.  HEENT: MMM.  Vision and hearing grossly intact.  NECK: Supple.  No apparent JVD.  RESP:  No IWOB.  Fair aeration bilaterally. CVS:  RRR. Heart sounds normal.  ABD/GI/GU: BS+. Abd soft, NTND.  MSK/EXT:  Moves extremities. No apparent deformity. No edema.  SKIN: no apparent skin lesion or wound NEURO: Sleepy quietly.  No apparent focal neuro deficit  PSYCH: No distress or agitation.  Sleeping.  Procedures:  None  Microbiology summarized: Urine culture with multiple species. CSF culture NGTD  Assessment and plan: Confusion/agitation/audiovisual hallucination: Suspect undiagnosed major cognitive impairment with rapid decline.   Daughter noted some cognitive decline over the last 1 year that has progressed quickly in the last 2 months.  She was confused, hallucinating, wandering off and not caring for himself.  At times, she does not even recognize her daughter.  She does not take her regular meds either.  Has no PCP but goes to health department times. UDS positive for benzo without prescription.  UA with pyuria but she does not have fever or leukocytosis.  No suprapubic tenderness  either.  CTH, RPR, CRP, HIV, TSH, B6, A1, folate and B12 within normal.  EEG with mild diffuse encephalopathy but no seizure or epileptiform discharge.  MRI brain as above.  CT chest/abdomen/pelvis negative for malignancy. Agitation seems to have resolved with low-dose Zyprexa . S/p fluoroscopy guided LMP on 1/30.  CSF studies including chemistry, cell count, culture, meningitis, encephalitis and paraneoplastic  panels negative. -S/p high-dose thiamine , B12 injection.   -Continue p.o. B12, B1, and vitamin D . -Completed antibiotic course for possible UTI/pyuria. -Continue delirium precaution -Continue low-dose Zyprexa .  IV Haldol  as needed.  QTc 426. -Continue one-to-one safety sitter due to fall risk.  Off soft restraints. -Neurology and psychiatry signed off.  Pyuria: Unclear if this is UTI or just pyuria.  She has no fever, leukocytosis or suprapubic tenderness.  Urine culture with multiple species.  Received ceftriaxone  from 1/24-1/27.  AKI: Likely prerenal from poor p.o. intake, lisinopril  and HCTZ.  AKI improved. Recent Labs    08/12/23 1839 08/13/23 0622 08/14/23 0610 08/15/23 0601 08/16/23 9356 08/17/23 0737 08/18/23 0521 08/20/23 0600 08/24/23 0632  BUN 14 14 12  27* 28* 29* 19 33* 32*  CREATININE 0.89 0.81 1.11* 1.36* 1.11* 1.23* 1.02* 1.06* 1.17*  -Lisinopril /HCTZ discontinued on 1/27 -Monitor renal function intermittently. -Ensure hydration.  May need intermittent boluses.   NIDDM-2: On metformin  at home but does not take.  A1c 6.2%. Recent Labs  Lab 08/27/23 1638 08/27/23 2109 08/28/23 0709 08/28/23 0846 08/28/23 1128  GLUCAP 110* 152* 95 107* 101*  -Decrease SSI to very sensitive.  Essential hypertension: Blood pressure seems to fluctuate likely technical -Discontinued lisinopril  and HCTZ due to rising creatinine. -Continue amlodipine  10 mg daily -P.o. hydralazine  as needed  Microcytic anemia: Stable.  Anemia panel with mild iron deficiency.  B12 305. Recent Labs    08/12/23 1839 08/13/23 0622 08/14/23 0610 08/15/23 0601 08/16/23 9356 08/17/23 0737 08/18/23 0521 08/20/23 0600 08/24/23 0632  HGB 12.4 11.9* 13.3 13.3 12.6 12.9 14.4 13.0 13.3  -B12 supplementation as above -Continue monitoring  Physical deconditioning -PT/OT recommended SNF.  Difficult disposition due to ongoing need for safety sitter and restraints.  Bedbug infestation -Continue  contact precaution  Advance care planning: Discussed CODE STATUS with patient's daughter.  Patient is full code.  Body mass index is 26.17 kg/m.  Pressure skin injury: Not POA Pressure Injury 08/21/23 Elbow Left;Posterior Stage 2 -  Partial thickness loss of dermis presenting as a shallow open injury with a red, pink wound bed without slough. (Active)  08/21/23 1346  Location: Elbow  Location Orientation: Left;Posterior  Staging: Stage 2 -  Partial thickness loss of dermis presenting as a shallow open injury with a red, pink wound bed without slough.  Wound Description (Comments):   Present on Admission: No  Dressing Type Foam - Lift dressing to assess site every shift 08/23/23 2158     Pressure Injury 08/21/23 Elbow Posterior;Right Stage 2 -  Partial thickness loss of dermis presenting as a shallow open injury with a red, pink wound bed without slough. (Active)  08/21/23 1348  Location: Elbow  Location Orientation: Posterior;Right  Staging: Stage 2 -  Partial thickness loss of dermis presenting as a shallow open injury with a red, pink wound bed without slough.  Wound Description (Comments):   Present on Admission: No  Dressing Type Foam - Lift dressing to assess site every shift 08/23/23 2158   DVT prophylaxis:  SCDs Start: 08/13/23 0543  Code Status: Full code Family Communication: None at bedside today. Level of  care: Med-Surg Status is: Inpatient Remains inpatient appropriate because: Agitation/psychosis/confusion, AKI and encephalopathy   Final disposition: ALF Consultants:  Neurology Psychiatry  25 minutes with more than 50% spent in reviewing records, counseling patient/family and coordinating care.   Sch Meds:  Scheduled Meds:  amLODipine   10 mg Oral Daily   cholecalciferol   2,000 Units Oral Daily   vitamin B-12  1,000 mcg Oral Daily   feeding supplement  237 mL Oral BID BM   insulin  aspart  0-6 Units Subcutaneous TID WC   OLANZapine   2.5 mg Oral q AM    And   OLANZapine   5 mg Oral QHS   Continuous Infusions:    PRN Meds:.acetaminophen  **OR** acetaminophen , haloperidol , haloperidol  lactate **OR** haloperidol  lactate, hydrALAZINE , ondansetron  **OR** ondansetron  (ZOFRAN ) IV, mouth rinse  Antimicrobials: Anti-infectives (From admission, onward)    Start     Dose/Rate Route Frequency Ordered Stop   08/13/23 1000  cefTRIAXone  (ROCEPHIN ) 1 g in sodium chloride  0.9 % 100 mL IVPB  Status:  Discontinued        1 g 200 mL/hr over 30 Minutes Intravenous Every 24 hours 08/13/23 0530 08/15/23 1424   08/12/23 2300  cefTRIAXone  (ROCEPHIN ) 2 g in sodium chloride  0.9 % 100 mL IVPB        2 g 200 mL/hr over 30 Minutes Intravenous  Once 08/12/23 2245 08/13/23 0018        I have personally reviewed the following labs and images: CBC: Recent Labs  Lab 08/24/23 0632  WBC 5.9  HGB 13.3  HCT 38.1  MCV 76.2*  PLT 226   BMP &GFR Recent Labs  Lab 08/24/23 0632  NA 138  K 4.2  CL 101  CO2 25  GLUCOSE 122*  BUN 32*  CREATININE 1.17*  CALCIUM 9.5  MG 2.2  PHOS 4.0   Estimated Creatinine Clearance: 41.8 mL/min (A) (by C-G formula based on SCr of 1.17 mg/dL (H)). Liver & Pancreas: Recent Labs  Lab 08/24/23 9367  ALBUMIN 3.2*   No results for input(s): LIPASE, AMYLASE in the last 168 hours. No results for input(s): AMMONIA in the last 168 hours. Diabetic: No results for input(s): HGBA1C in the last 72 hours.  Recent Labs  Lab 08/27/23 1638 08/27/23 2109 08/28/23 0709 08/28/23 0846 08/28/23 1128  GLUCAP 110* 152* 95 107* 101*   Cardiac Enzymes: No results for input(s): CKTOTAL, CKMB, CKMBINDEX, TROPONINI in the last 168 hours. No results for input(s): PROBNP in the last 8760 hours. Coagulation Profile: No results for input(s): INR, PROTIME in the last 168 hours. Thyroid Function Tests: No results for input(s): TSH, T4TOTAL, FREET4, T3FREE, THYROIDAB in the last 72 hours.  Lipid Profile: No  results for input(s): CHOL, HDL, LDLCALC, TRIG, CHOLHDL, LDLDIRECT in the last 72 hours. Anemia Panel: No results for input(s): VITAMINB12, FOLATE, FERRITIN, TIBC, IRON, RETICCTPCT in the last 72 hours.  Urine analysis:    Component Value Date/Time   COLORURINE YELLOW 08/12/2023 1752   APPEARANCEUR HAZY (A) 08/12/2023 1752   LABSPEC 1.021 08/12/2023 1752   PHURINE 6.0 08/12/2023 1752   GLUCOSEU NEGATIVE 08/12/2023 1752   HGBUR NEGATIVE 08/12/2023 1752   BILIRUBINUR NEGATIVE 08/12/2023 1752   KETONESUR NEGATIVE 08/12/2023 1752   PROTEINUR 30 (A) 08/12/2023 1752   NITRITE NEGATIVE 08/12/2023 1752   LEUKOCYTESUR LARGE (A) 08/12/2023 1752   Sepsis Labs: Invalid input(s): PROCALCITONIN, LACTICIDVEN  Microbiology: No results found for this or any previous visit (from the past 240 hours).   Radiology Studies: No results  found.      Niani Mourer T. Sheryl Towell [facility]  If 7PM-7AM, please contact night-coverage [email or web address] 08/28/2023, 11:51 AM

## 2023-08-29 DIAGNOSIS — F0390 Unspecified dementia without behavioral disturbance: Secondary | ICD-10-CM | POA: Diagnosis not present

## 2023-08-29 DIAGNOSIS — I10 Essential (primary) hypertension: Secondary | ICD-10-CM | POA: Diagnosis not present

## 2023-08-29 DIAGNOSIS — G9341 Metabolic encephalopathy: Secondary | ICD-10-CM | POA: Diagnosis not present

## 2023-08-29 DIAGNOSIS — N3000 Acute cystitis without hematuria: Secondary | ICD-10-CM | POA: Diagnosis not present

## 2023-08-29 LAB — GLUCOSE, CAPILLARY
Glucose-Capillary: 89 mg/dL (ref 70–99)
Glucose-Capillary: 183 mg/dL — ABNORMAL HIGH (ref 70–99)
Glucose-Capillary: 106 mg/dL — ABNORMAL HIGH (ref 70–99)
Glucose-Capillary: 198 mg/dL — ABNORMAL HIGH (ref 70–99)

## 2023-08-29 NOTE — TOC Progression Note (Addendum)
 Transition of Care [facility]) - Progression Note    Patient Details  Name: Shannon Barrett MRN: [id number] Date of Birth: February 07, 1956  Transition of Care [facility]) CM/SW Contact  Sherial Dimes, LCSW Phone Number: 08/29/2023, 10:46 AM  Clinical Narrative:    CSW was advised that pt has not required a sitter or restraints. CSW confirmed with the medical team. CSW followed up with facility, VM left requesting placement updates. TOC will continue to follow.    12:20 Facility noting that CSW should know today if they can take pt, as it needed to go through the business office. CSW to continue to follow.   3:00 CSW was advised that [facility] not take due to bed bugs. CSW noted pt is off of precautions and has been here for 2 weeks with no active issues. They will review again. CSW spoke with [facility] who will review pt for possible memory care placement. TOC will continue to follow.   Expected Discharge Plan: Assisted Living Barriers to Discharge: Continued Medical Work up, English as a second language teacher, Inadequate or no insurance, Unsafe home situation, Other (must enter comment) (MC/ALF placement)  Expected Discharge Plan and Services In-house Referral: Clinical Social Work   Post Acute Care Choice:  (ALF/ MC) Living arrangements for the past 2 months: Apartment                                       Social Determinants of Health (SDOH) Interventions SDOH Screenings   Food Insecurity: Patient Unable To Answer (08/13/2023)  Housing: Patient Unable To Answer (08/13/2023)  Transportation Needs: Patient Unable To Answer (08/13/2023)  Utilities: Patient Unable To Answer (08/13/2023)  Social Connections: Unknown (08/13/2023)  Tobacco Use: Low Risk  (08/12/2023)    Readmission Risk Interventions     No data to display

## 2023-08-29 NOTE — [place] FL2 (Signed)
[place] MEDICAID FL2 LEVEL OF CARE FORM     IDENTIFICATION  Patient Name: Shannon Barrett Birthdate: 1956/04/26 Sex: female Admission Date (Current Location): 08/12/2023  [facility] and [place] Number:  Producer, television/film/video and Address:  [facility] [place], [address] [place], [place] [address]      Provider Number: [id number]  Attending Physician Name and Address:  Almon Hercules, MD  Relative Name and Phone Number:  Caryl Ada  [phone]    Current Level of Care: Hospital Recommended Level of Care: Memory Care Prior Approval Number:    Date Approved/Denied:   PASRR Number:    Discharge Plan: Other (Comment) (Memory Care)    Current Diagnoses: Patient Active Problem List   Diagnosis Date Noted   Acute metabolic encephalopathy 08/13/2023   Microcytic anemia 08/13/2023   Essential hypertension 08/13/2023   Type 2 diabetes mellitus (HCC) 08/13/2023   UTI (urinary tract infection) 08/12/2023    Orientation RESPIRATION BLADDER Height & Weight     Self  Normal Continent Weight: 143 lb 1.3 oz (64.9 kg) Height:  5\' 2"  (157.5 cm)  BEHAVIORAL SYMPTOMS/MOOD NEUROLOGICAL BOWEL NUTRITION STATUS      Continent Diet (Regular, Heart Healthy)  AMBULATORY STATUS COMMUNICATION OF NEEDS Skin   Supervision Verbally PU Stage and Appropriate Care (Bilateral Elbow PU St 2)                       Personal Care Assistance Level of Assistance  Bathing, Feeding, Dressing Bathing Assistance: Limited assistance Feeding assistance: Limited assistance Dressing Assistance: Limited assistance     Functional Limitations Info  Sight, Hearing, Speech Sight Info: Adequate Hearing Info: Adequate Speech Info: Adequate    SPECIAL CARE FACTORS FREQUENCY  PT (By licensed PT), OT (By licensed OT)     PT Frequency: x1 Week OT Frequency: x1 Week            Contractures Contractures Info: Not present    Additional Factors Info  Code Status,  Allergies, Psychotropic, Insulin Sliding Scale Code Status Info: Full Allergies Info: Amoxicillin  Aspirin  Claritin (Loratadine)  Ibuprofen  Peanut-containing Drug Products  Penicillins  Shellfish-derived Products  Strawberry (Diagnostic) Psychotropic Info: Olanzapine Insulin Sliding Scale Info: insulin aspart (novoLOG) injection 0-6 Units  Dose: 0-6 Units  Freq: 3 times daily with meals Route: [place]       Current Medications (08/29/2023):  This is the current hospital active medication list Current Facility-Administered Medications  Medication Dose Route Frequency Provider Last Rate Last Admin   acetaminophen (TYLENOL) tablet 650 mg  650 mg Oral Q6H PRN Adefeso, Oladapo, DO   650 mg at 08/26/23 2016   Or   acetaminophen (TYLENOL) suppository 650 mg  650 mg Rectal Q6H PRN Adefeso, Oladapo, DO       amLODipine (NORVASC) tablet 10 mg  10 mg Oral Daily Candelaria Stagers T, MD   10 mg at 08/29/23 0947   cholecalciferol (VITAMIN D3) 25 MCG (1000 UNIT) tablet 2,000 Units  2,000 Units Oral Daily Candelaria Stagers T, MD   2,000 Units at 08/29/23 0947   cyanocobalamin (VITAMIN B12) tablet 1,000 mcg  1,000 mcg Oral Daily Candelaria Stagers T, MD   1,000 mcg at 08/29/23 0947   feeding supplement (ENSURE ENLIVE / ENSURE PLUS) liquid 237 mL  237 mL Oral BID BM Gonfa, Taye T, MD   237 mL at 08/29/23 0947   haloperidol (HALDOL) tablet 0.5 mg  0.5 mg Oral Q8H  PRN Kathlen Mody, MD   0.5 mg at 08/28/23 1323   haloperidol lactate (HALDOL) injection 2 mg  2 mg Intravenous Q6H PRN Candelaria Stagers T, MD   2 mg at 08/20/23 2047   Or   haloperidol lactate (HALDOL) injection 2 mg  2 mg Intramuscular Q6H PRN Candelaria Stagers T, MD   2 mg at 08/29/23 1334   hydrALAZINE (APRESOLINE) injection 10 mg  10 mg Intravenous Q6H PRN Adefeso, Oladapo, DO       insulin aspart (novoLOG) injection 0-6 Units  0-6 Units Subcutaneous TID WC Candelaria Stagers T, MD   1 Units at 08/29/23 1329   OLANZapine (ZYPREXA) tablet 2.5 mg  2.5 mg Oral q AM Candelaria Stagers T, MD   2.5 mg  at 08/29/23 1610   And   OLANZapine (ZYPREXA) tablet 5 mg  5 mg Oral QHS Candelaria Stagers T, MD   5 mg at 08/28/23 2108   ondansetron (ZOFRAN) tablet 4 mg  4 mg Oral Q6H PRN Adefeso, Oladapo, DO       Or   ondansetron (ZOFRAN) injection 4 mg  4 mg Intravenous Q6H PRN Adefeso, Oladapo, DO       Oral care mouth rinse  15 mL Mouth Rinse PRN Almon Hercules, MD         Discharge Medications: Please see discharge summary for a list of discharge medications.  Relevant Imaging Results:  Relevant Lab Results:   Additional Information SS# [id number], [place]

## 2023-08-29 NOTE — Progress Notes (Addendum)
 Mobility Specialist Progress Note:   08/29/23 1312  Mobility  Activity Ambulated independently in hallway  Level of Assistance Independent  Assistive Device None  Distance Ambulated (ft) 400 ft  Activity Response Tolerated well  Mobility Referral Yes  Mobility visit 1 Mobility  Mobility Specialist Start Time (ACUTE ONLY) 1250  Mobility Specialist Stop Time (ACUTE ONLY) 1300  Mobility Specialist Time Calculation (min) (ACUTE ONLY) 10 min   Pt received ambulating in hallway independently w/o socks or shoes on.  Pt unwilling to return to room needing max verbal cues for redirection. RN notified. Ambulated 412ft independently w/o fault. Pt returned to room with RN present.  Sofia Dunn  Mobility Specialist Please contact via [employer] office at [phone]

## 2023-08-29 NOTE — Progress Notes (Signed)
 PROGRESS NOTE  RAHNIYA GUAN JYN:[id number] DOB: 1956-03-05   PCP: [facility], [facility]  Patient is from: Home  DOA: 08/12/2023 LOS: 17  Chief complaints Chief Complaint  Patient presents with   Altered Mental Status     Brief Narrative / Interim history: 68 y.o. F with PMH of DM-2 and HTN brought to ED due to confusion, visual and auditory hallucination.  Reportedly with known bedbugs and bites all over.  Per POA report to triage nurse, patient was not showering or taking her medications.  Patient does confused and only oriented to self on presentation.  In ED, slightly hypertensive.  Basic labs without significant finding.  UA with large LE and rare bacteria.  UDS positive for benzo.  Does not seem to prescription for benzo.  CT head and CXR without significant finding.  Patient was given IM Geodon  10 mg x 1, IM Haldol  2 mg x 1, Benadryl  25 mg p.o. x 1 and IV ceftriaxone .  Admission requested for acute encephalopathy in the setting of presumed UTI.   Patient continued to be confused and agitated at times.  Per discussion with patient's daughter, rapid cognitive decline over the last 2 months.  Neurology consulted and recommended MRI brain with and transferred to [facility] for further workup.  EEG negative for seizure or epilepsy.  MRI brain with/without contrast attempted but only diffusion sequence obtained and is reassuring.  CT chest/abdomen/pelvis did not show malignancy.  Bedside LP unsuccessful on 1/29.  She underwent fluoroscopy guided LP on 1/30.  CSF studies including chemistry, cell count, meningitis, encephalopathy test and paraneoplastic panel negative. Paraneoplastic/autoimmune panel pending.  Neurology signed off.  Therapy recommended SNF.  TOC working on ALF.  Seems to be stable and calm off restraints and safety sitter.  Subjective: Seen and examined earlier this morning.  No major events overnight of this morning.  She is sleeping.  No restraints.  No Retail buyer.  She woke up and asked for some sandwiches  and went back to bed.  Objective: Vitals:   08/28/23 1631 08/28/23 2035 08/29/23 0434 08/29/23 0723  BP: 125/78 (!) 130/115 (!) 141/97 (!) 136/93  Pulse: (!) 103 (!) 109 (!) 104 90  Resp: 18 18 18 16   Temp: 98.1 F (36.7 C) 98.3 F (36.8 C) 97.7 F (36.5 C) 98.2 F (36.8 C)  TempSrc: Oral Oral Oral Oral  SpO2: 100% 100% 100% 100%  Weight:      Height:        Examination:  GENERAL: No apparent distress.  Nontoxic.  HEENT: MMM.  Vision and hearing grossly intact.  NECK: Supple.  No apparent JVD.  RESP:  No IWOB.  Fair aeration bilaterally. CVS:  RRR. Heart sounds normal.  ABD/GI/GU: BS+. Abd soft, NTND.  MSK/EXT:  Moves extremities. No apparent deformity. No edema.  No soft restraints. SKIN: no apparent skin lesion or wound NEURO: Sleepy but wakes to voice.  Oriented x 1.  Follows commands. PSYCH: Calm.  No distress or agitation.   Procedures:  None  Microbiology summarized: Urine culture with multiple species. CSF culture NGTD  Assessment and plan: Confusion/agitation/audiovisual hallucination: Suspect undiagnosed major cognitive impairment with rapid decline.   Daughter noted some cognitive decline over the last 1 year that has progressed quickly in the last 2 months.  She was confused, hallucinating, wandering off and not caring for himself.  At times, she does not even recognize her daughter.  She does not take her regular meds either.  Has  no PCP but goes to health department times. UDS positive for benzo without prescription.  UA with pyuria but she does not have fever or leukocytosis.  No suprapubic tenderness either.  CTH, RPR, CRP, HIV, TSH, B6, A1, folate and B12 within normal.  EEG with mild diffuse encephalopathy but no seizure or epileptiform discharge.  MRI brain as above.  CT chest/abdomen/pelvis negative for malignancy. Agitation seems to have resolved with low-dose Zyprexa . S/p fluoroscopy guided LMP on 1/30.   CSF studies including chemistry, cell count, culture, meningitis, encephalitis and paraneoplastic panels negative. -S/p high-dose thiamine , B12 injection.   -Continue p.o. B12, B1, and vitamin D . -Completed antibiotic course for possible UTI/pyuria. -Continue delirium precaution -Continue low-dose Zyprexa .  IV Haldol  as needed.  QTc 426. -Seems to be stable off soft restraints.  Monitor closely -Neurology and psychiatry signed off.  Pyuria: Unclear if this is UTI or just pyuria.  She has no fever, leukocytosis or suprapubic tenderness.  Urine culture with multiple species.  Received ceftriaxone  from 1/24-1/27.  AKI: Likely prerenal from poor p.o. intake, lisinopril  and HCTZ.  AKI improved. Recent Labs    08/12/23 1839 08/13/23 0622 08/14/23 0610 08/15/23 0601 08/16/23 1610 08/17/23 0737 08/18/23 0521 08/20/23 0600 08/24/23 0632  BUN 14 14 12  27* 28* 29* 19 33* 32*  CREATININE 0.89 0.81 1.11* 1.36* 1.11* 1.23* 1.02* 1.06* 1.17*  -Lisinopril /HCTZ discontinued on 1/27 -Monitor renal function intermittently. -Ensure hydration.  May need intermittent boluses.   NIDDM-2: On metformin  at home but does not take.  A1c 6.2%. Recent Labs  Lab 08/28/23 0846 08/28/23 1128 08/28/23 1626 08/28/23 2047 08/29/23 0815  GLUCAP 107* 101* 108* 196* 89  -Decrease SSI to very sensitive.  Essential hypertension: Blood pressure seems to fluctuate likely technical -Discontinued lisinopril  and HCTZ due to rising creatinine. -Continue amlodipine  10 mg daily -P.o. hydralazine  as needed  Microcytic anemia: Stable.  Anemia panel with mild iron deficiency.  B12 305. Recent Labs    08/12/23 1839 08/13/23 0622 08/14/23 0610 08/15/23 0601 08/16/23 9604 08/17/23 0737 08/18/23 0521 08/20/23 0600 08/24/23 0632  HGB 12.4 11.9* 13.3 13.3 12.6 12.9 14.4 13.0 13.3  -B12 supplementation as above -Continue monitoring  Physical deconditioning -PT/OT recommended SNF.  Difficult disposition due to  ongoing need for safety sitter and restraints.  Bedbug infestation -Continue contact precaution  Advance care planning: Discussed CODE STATUS with patient's daughter.  Patient is full code.  Body mass index is 26.17 kg/m.  Pressure skin injury: Not POA Pressure Injury 08/21/23 Elbow Left;Posterior Stage 2 -  Partial thickness loss of dermis presenting as a shallow open injury with a red, pink wound bed without slough. (Active)  08/21/23 1346  Location: Elbow  Location Orientation: Left;Posterior  Staging: Stage 2 -  Partial thickness loss of dermis presenting as a shallow open injury with a red, pink wound bed without slough.  Wound Description (Comments):   Present on Admission: No  Dressing Type Foam - Lift dressing to assess site every shift 08/23/23 2158     Pressure Injury 08/21/23 Elbow Posterior;Right Stage 2 -  Partial thickness loss of dermis presenting as a shallow open injury with a red, pink wound bed without slough. (Active)  08/21/23 1348  Location: Elbow  Location Orientation: Posterior;Right  Staging: Stage 2 -  Partial thickness loss of dermis presenting as a shallow open injury with a red, pink wound bed without slough.  Wound Description (Comments):   Present on Admission: No  Dressing Type Foam - Lift dressing to  assess site every shift 08/23/23 2158   DVT prophylaxis:  SCDs Start: 08/13/23 0543  Code Status: Full code Family Communication: None at bedside today. Level of care: Med-Surg Status is: Inpatient Remains inpatient appropriate because: Agitation/psychosis/confusion, AKI and encephalopathy   Final disposition: ALF Consultants:  Neurology Psychiatry  25 minutes with more than 50% spent in reviewing records, counseling patient/family and coordinating care.   Sch Meds:  Scheduled Meds:  amLODipine   10 mg Oral Daily   cholecalciferol   2,000 Units Oral Daily   vitamin B-12  1,000 mcg Oral Daily   feeding supplement  237 mL Oral BID BM    insulin  aspart  0-6 Units Subcutaneous TID WC   OLANZapine   2.5 mg Oral q AM   And   OLANZapine   5 mg Oral QHS   Continuous Infusions:    PRN Meds:.acetaminophen  **OR** acetaminophen , haloperidol , haloperidol  lactate **OR** haloperidol  lactate, hydrALAZINE , ondansetron  **OR** ondansetron  (ZOFRAN ) IV, mouth rinse  Antimicrobials: Anti-infectives (From admission, onward)    Start     Dose/Rate Route Frequency Ordered Stop   08/13/23 1000  cefTRIAXone  (ROCEPHIN ) 1 g in sodium chloride  0.9 % 100 mL IVPB  Status:  Discontinued        1 g 200 mL/hr over 30 Minutes Intravenous Every 24 hours 08/13/23 0530 08/15/23 1424   08/12/23 2300  cefTRIAXone  (ROCEPHIN ) 2 g in sodium chloride  0.9 % 100 mL IVPB        2 g 200 mL/hr over 30 Minutes Intravenous  Once 08/12/23 2245 08/13/23 0018        I have personally reviewed the following labs and images: CBC: Recent Labs  Lab 08/24/23 0632  WBC 5.9  HGB 13.3  HCT 38.1  MCV 76.2*  PLT 226   BMP &GFR Recent Labs  Lab 08/24/23 0632  NA 138  K 4.2  CL 101  CO2 25  GLUCOSE 122*  BUN 32*  CREATININE 1.17*  CALCIUM 9.5  MG 2.2  PHOS 4.0   Estimated Creatinine Clearance: 41.8 mL/min (A) (by C-G formula based on SCr of 1.17 mg/dL (H)). Liver & Pancreas: Recent Labs  Lab 08/24/23 0454  ALBUMIN 3.2*   No results for input(s): "LIPASE", "AMYLASE" in the last 168 hours. No results for input(s): "AMMONIA" in the last 168 hours. Diabetic: No results for input(s): "HGBA1C" in the last 72 hours.  Recent Labs  Lab 08/28/23 0846 08/28/23 1128 08/28/23 1626 08/28/23 2047 08/29/23 0815  GLUCAP 107* 101* 108* 196* 89   Cardiac Enzymes: No results for input(s): "CKTOTAL", "CKMB", "CKMBINDEX", "TROPONINI" in the last 168 hours. No results for input(s): "PROBNP" in the last 8760 hours. Coagulation Profile: No results for input(s): "INR", "PROTIME" in the last 168 hours. Thyroid Function Tests: No results for input(s): "TSH",  "T4TOTAL", "FREET4", "T3FREE", "THYROIDAB" in the last 72 hours.  Lipid Profile: No results for input(s): "CHOL", "HDL", "LDLCALC", "TRIG", "CHOLHDL", "LDLDIRECT" in the last 72 hours. Anemia Panel: No results for input(s): "VITAMINB12", "FOLATE", "FERRITIN", "TIBC", "IRON", "RETICCTPCT" in the last 72 hours.  Urine analysis:    Component Value Date/Time   COLORURINE YELLOW 08/12/2023 1752   APPEARANCEUR HAZY (A) 08/12/2023 1752   LABSPEC 1.021 08/12/2023 1752   PHURINE 6.0 08/12/2023 1752   GLUCOSEU NEGATIVE 08/12/2023 1752   HGBUR NEGATIVE 08/12/2023 1752   BILIRUBINUR NEGATIVE 08/12/2023 1752   KETONESUR NEGATIVE 08/12/2023 1752   PROTEINUR 30 (A) 08/12/2023 1752   NITRITE NEGATIVE 08/12/2023 1752   LEUKOCYTESUR LARGE (A) 08/12/2023 1752  Sepsis Labs: Invalid input(s): "PROCALCITONIN", "LACTICIDVEN"  Microbiology: No results found for this or any previous visit (from the past 240 hours).   Radiology Studies: No results found.      Laporshia Hogen T. [place] Mielke [facility]  If 7PM-7AM, please contact night-coverage [email or web address] 08/29/2023, 11:44 AM

## 2023-08-30 DIAGNOSIS — N3000 Acute cystitis without hematuria: Secondary | ICD-10-CM | POA: Diagnosis not present

## 2023-08-30 DIAGNOSIS — G9341 Metabolic encephalopathy: Secondary | ICD-10-CM | POA: Diagnosis not present

## 2023-08-30 DIAGNOSIS — I10 Essential (primary) hypertension: Secondary | ICD-10-CM | POA: Diagnosis not present

## 2023-08-30 DIAGNOSIS — F0390 Unspecified dementia without behavioral disturbance: Secondary | ICD-10-CM | POA: Diagnosis not present

## 2023-08-30 LAB — GLUCOSE, CAPILLARY
Glucose-Capillary: 93 mg/dL (ref 70–99)
Glucose-Capillary: 138 mg/dL — ABNORMAL HIGH (ref 70–99)
Glucose-Capillary: 140 mg/dL — ABNORMAL HIGH (ref 70–99)
Glucose-Capillary: 124 mg/dL — ABNORMAL HIGH (ref 70–99)

## 2023-08-30 LAB — CBC
HCT: 35.8 % — ABNORMAL LOW (ref 36.0–46.0)
Hemoglobin: 12.4 g/dL (ref 12.0–15.0)
MCH: 26.7 pg (ref 26.0–34.0)
MCHC: 34.6 g/dL (ref 30.0–36.0)
MCV: 77 fL — ABNORMAL LOW (ref 80.0–100.0)
Platelets: 235 10*3/uL (ref 150–400)
RBC: 4.65 MIL/uL (ref 3.87–5.11)
RDW: 14.2 % (ref 11.5–15.5)
WBC: 6.2 10*3/uL (ref 4.0–10.5)
nRBC: 0 % (ref 0.0–0.2)

## 2023-08-30 LAB — RENAL FUNCTION PANEL
Albumin: 3.8 g/dL (ref 3.5–5.0)
Anion gap: 14 (ref 5–15)
BUN: 29 mg/dL — ABNORMAL HIGH (ref 8–23)
CO2: 26 mmol/L (ref 22–32)
Calcium: 9.4 mg/dL (ref 8.9–10.3)
Chloride: 96 mmol/L — ABNORMAL LOW (ref 98–111)
Creatinine, Ser: 1.08 mg/dL — ABNORMAL HIGH (ref 0.44–1.00)
GFR, Estimated: 57 mL/min — ABNORMAL LOW (ref >60)
Glucose, Bld: 160 mg/dL — ABNORMAL HIGH (ref 70–99)
Phosphorus: 3.2 mg/dL (ref 2.5–4.6)
Potassium: 4.2 mmol/L (ref 3.5–5.1)
Sodium: 136 mmol/L (ref 135–145)

## 2023-08-30 LAB — MAGNESIUM: Magnesium: 2.3 mg/dL (ref 1.7–2.4)

## 2023-08-30 NOTE — TOC Progression Note (Addendum)
Transition of Care [facility]) - Progression Note    Patient Details  Name: Shannon Barrett MRN: [id number] Date of Birth: 1955-11-21  Transition of Care [facility]) CM/SW Contact  Carley Hammed, LCSW Phone Number: 08/30/2023, 1:55 PM  Clinical Narrative:    CSW was advised by [facility] that they are unable to offer on pt. CSW spoke with Zeb Comfort who noted they have Medicaid beds available. Director would like a video call to assess pt tomorrow at 11. CSW attempted to update pt's niece, unable to leave a message. CSW requested a Quantefiron TB in preparation for memory care. Director at [facility] [phone]. TOC will continue to follow.   2:45 Monique to discuss with pt's dtr, CSW will assist with tele conference tomorrow.   4:15 CSW spoke with pt's niece and dtr and advised of the meeting with WO tomorrow. CSW answered all questions. Family concerned that pt's documentation still says bed bugs as she was thoroughly cleaned before admission and there have been no further evidence of them. CSW attempted to answer questions about contact precautions. TOC will continue to follow.  Expected Discharge Plan: Assisted Living Barriers to Discharge: Continued Medical Work up, English as a second language teacher, Inadequate or no insurance, Unsafe home situation, Other (must enter comment) (MC/ALF placement)  Expected Discharge Plan and Services In-house Referral: Clinical Social Work   Post Acute Care Choice:  (ALF/ MC) Living arrangements for the past 2 months: Apartment                                       Social Determinants of Health (SDOH) Interventions SDOH Screenings   Food Insecurity: Patient Unable To Answer (08/13/2023)  Housing: Patient Unable To Answer (08/13/2023)  Transportation Needs: Patient Unable To Answer (08/13/2023)  Utilities: Patient Unable To Answer (08/13/2023)  Social Connections: Unknown (08/13/2023)  Tobacco Use: Low Risk  (08/12/2023)     Readmission Risk Interventions     No data to display

## 2023-08-30 NOTE — Plan of Care (Signed)
  Problem: Activity: Goal: Risk for activity intolerance will decrease Outcome: Progressing   Problem: Safety: Goal: Non-violent Restraint(s) Outcome: Progressing

## 2023-08-30 NOTE — Progress Notes (Signed)
PROGRESS NOTE  Shannon Barrett GNF:[id number] DOB: Apr 25, 1956   PCP: Health, [facility]  Patient is from: Home  DOA: 08/12/2023 LOS: 18  Chief complaints Chief Complaint  Patient presents with   Altered Mental Status     Brief Narrative / Interim history: 68 y.o. F with PMH of DM-2 and HTN brought to ED due to confusion, visual and auditory hallucination.  Reportedly with known bedbugs and bites all over.  Per POA report to triage nurse, patient was not showering or taking her medications.  Patient does confused and only oriented to self on presentation.  In ED, slightly hypertensive.  Basic labs without significant finding.  UA with large LE and rare bacteria.  UDS positive for benzo.  Does not seem to prescription for benzo.  CT head and CXR without significant finding.  Patient was given IM Geodon 10 mg x 1, IM Haldol 2 mg x 1, Benadryl 25 mg p.o. x 1 and IV ceftriaxone.  Admission requested for acute encephalopathy in the setting of presumed UTI.   Patient continued to be confused and agitated at times.  Per discussion with patient's daughter, rapid cognitive decline over the last 2 months.  Neurology consulted and recommended MRI brain with and transferred to [facility] for further workup.  EEG negative for seizure or epilepsy.  MRI brain with/without contrast attempted but only diffusion sequence obtained and is reassuring.  CT chest/abdomen/pelvis did not show malignancy.  Bedside LP unsuccessful on 1/29.  She underwent fluoroscopy guided LP on 1/30.  CSF studies including chemistry, cell count, meningitis, encephalopathy test and paraneoplastic panel negative. Paraneoplastic/autoimmune panel pending.  Neurology signed off.  Therapy recommended SNF.  TOC working on ALF.  Seems to be stable and calm off restraints and safety sitter for the last 2 to 3 days now.  TOC working on placement.  Subjective: Seen and examined earlier this morning.  No major events overnight of  this morning.  Sleeping quietly.  Objective: Vitals:   08/29/23 1621 08/29/23 2013 08/30/23 0517 08/30/23 0926  BP: (!) 132/110 (!) 143/76 (!) 138/57 125/74  Pulse: (!) 108  65 87  Resp: 16  17 17   Temp: 98.5 F (36.9 C) 98.3 F (36.8 C) 98 F (36.7 C) 98.3 F (36.8 C)  TempSrc: Oral Oral  Oral  SpO2: 100% 100% 100% 100%  Weight:      Height:        Examination:  GENERAL: No apparent distress.  Nontoxic.  HEENT: MMM.  Vision and hearing grossly intact.  NECK: Supple.  No apparent JVD.  RESP:  No IWOB.  Fair aeration bilaterally. CVS:  RRR. Heart sounds normal.  ABD/GI/GU: BS+. Abd soft, NTND.  MSK/EXT:  Moves extremities. No apparent deformity. No edema.  No soft restraints. SKIN: no apparent skin lesion or wound NEURO: Sleeping.  No apparent focal neurodeficit. PSYCH: Calm and sleeping quietly.  Procedures:  None  Microbiology summarized: Urine culture with multiple species. CSF culture NGTD  Assessment and plan: Confusion/agitation/audiovisual hallucination: Suspect undiagnosed major cognitive impairment with rapid decline.   Daughter noted some cognitive decline over the last 1 year that has progressed quickly in the last 2 months.  She was confused, hallucinating, wandering off and not caring for himself.  At times, she does not even recognize her daughter.  She does not take her regular meds either.  Has no PCP but goes to health department times. UDS positive for benzo without prescription.  UA with pyuria but she does  not have fever or leukocytosis.  No suprapubic tenderness either.  CTH, RPR, CRP, HIV, TSH, B6, A1, folate and B12 within normal.  EEG with mild diffuse encephalopathy but no seizure or epileptiform discharge.  MRI brain as above.  CT chest/abdomen/pelvis negative for malignancy. Agitation seems to have resolved with low-dose Zyprexa. S/p fluoroscopy guided LMP on 1/30.  CSF studies including chemistry, cell count, culture, meningitis, encephalitis and  paraneoplastic panels negative. -S/p high-dose thiamine, B12 injection.   -Continue p.o. B12, B1, and vitamin D. -Completed antibiotic course for possible UTI/pyuria. -Continue delirium precaution -Continue low-dose Zyprexa.  IV Haldol as needed.  QTc 426. -Seems to be stable off soft restraints.  Monitor closely -Neurology and psychiatry signed off.  Pyuria: Unclear if this is UTI or just pyuria.  She has no fever, leukocytosis or suprapubic tenderness.  Urine culture with multiple species.  Received ceftriaxone from 1/24-1/27.  AKI: Likely prerenal from poor p.o. intake, lisinopril and HCTZ.  AKI improved. Recent Labs    08/12/23 1839 08/13/23 0622 08/14/23 0610 08/15/23 0601 08/16/23 0981 08/17/23 0737 08/18/23 0521 08/20/23 0600 08/24/23 0632  BUN 14 14 12  27* 28* 29* 19 33* 32*  CREATININE 0.89 0.81 1.11* 1.36* 1.11* 1.23* 1.02* 1.06* 1.17*  -Lisinopril/HCTZ discontinued on 1/27 -Monitor renal function intermittently. -Ensure hydration.  May need intermittent boluses.   NIDDM-2: On metformin at home but does not take.  A1c 6.2%. Recent Labs  Lab 08/29/23 0815 08/29/23 1149 08/29/23 1654 08/29/23 2012 08/30/23 0826  GLUCAP 89 183* 106* 198* 93  -Decrease SSI to very sensitive.  Essential hypertension: Blood pressure seems to fluctuate likely technical -Discontinued lisinopril and HCTZ due to rising creatinine. -Continue amlodipine 10 mg daily -P.o. hydralazine as needed  Microcytic anemia: Stable.  Anemia panel with mild iron deficiency.  B12 305. Recent Labs    08/12/23 1839 08/13/23 0622 08/14/23 0610 08/15/23 0601 08/16/23 1914 08/17/23 0737 08/18/23 0521 08/20/23 0600 08/24/23 0632  HGB 12.4 11.9* 13.3 13.3 12.6 12.9 14.4 13.0 13.3  -B12 supplementation as above -Continue monitoring  Physical deconditioning -PT/OT recommended SNF.  Difficult disposition due to ongoing need for safety sitter and restraints.  Bedbug infestation -Continue contact  precaution  Advance care planning: Discussed CODE STATUS with patient's daughter.  Patient is full code.  Body mass index is 26.17 kg/m.  Pressure skin injury: Not POA Pressure Injury 08/21/23 Elbow Left;Posterior Stage 2 -  Partial thickness loss of dermis presenting as a shallow open injury with a red, pink wound bed without slough. (Active)  08/21/23 1346  Location: Elbow  Location Orientation: Left;Posterior  Staging: Stage 2 -  Partial thickness loss of dermis presenting as a shallow open injury with a red, pink wound bed without slough.  Wound Description (Comments):   Present on Admission: No  Dressing Type Foam - Lift dressing to assess site every shift 08/23/23 2158     Pressure Injury 08/21/23 Elbow Posterior;Right Stage 2 -  Partial thickness loss of dermis presenting as a shallow open injury with a red, pink wound bed without slough. (Active)  08/21/23 1348  Location: Elbow  Location Orientation: Posterior;Right  Staging: Stage 2 -  Partial thickness loss of dermis presenting as a shallow open injury with a red, pink wound bed without slough.  Wound Description (Comments):   Present on Admission: No  Dressing Type Foam - Lift dressing to assess site every shift 08/23/23 2158   DVT prophylaxis:  SCDs Start: 08/13/23 0543  Code Status: Full code Family  Communication: None at bedside today. Level of care: Med-Surg Status is: Inpatient Remains inpatient appropriate because: ALF/placement.   Final disposition: ALF Consultants:  Neurology Psychiatry  25 minutes with more than 50% spent in reviewing records, counseling patient/family and coordinating care.   Sch Meds:  Scheduled Meds:  amLODipine  10 mg Oral Daily   cholecalciferol  2,000 Units Oral Daily   vitamin B-12  1,000 mcg Oral Daily   feeding supplement  237 mL Oral BID BM   insulin aspart  0-6 Units Subcutaneous TID WC   OLANZapine  2.5 mg Oral q AM   And   OLANZapine  5 mg Oral QHS   Continuous  Infusions:    PRN Meds:.acetaminophen **OR** acetaminophen, haloperidol, haloperidol lactate **OR** haloperidol lactate, hydrALAZINE, ondansetron **OR** ondansetron (ZOFRAN) IV, mouth rinse  Antimicrobials: Anti-infectives (From admission, onward)    Start     Dose/Rate Route Frequency Ordered Stop   08/13/23 1000  cefTRIAXone (ROCEPHIN) 1 g in sodium chloride 0.9 % 100 mL IVPB  Status:  Discontinued        1 g 200 mL/hr over 30 Minutes Intravenous Every 24 hours 08/13/23 0530 08/15/23 1424   08/12/23 2300  cefTRIAXone (ROCEPHIN) 2 g in sodium chloride 0.9 % 100 mL IVPB        2 g 200 mL/hr over 30 Minutes Intravenous  Once 08/12/23 2245 08/13/23 0018        I have personally reviewed the following labs and images: CBC: Recent Labs  Lab 08/24/23 0632  WBC 5.9  HGB 13.3  HCT 38.1  MCV 76.2*  PLT 226   BMP &GFR Recent Labs  Lab 08/24/23 0632  NA 138  K 4.2  CL 101  CO2 25  GLUCOSE 122*  BUN 32*  CREATININE 1.17*  CALCIUM 9.5  MG 2.2  PHOS 4.0   Estimated Creatinine Clearance: 41.8 mL/min (A) (by C-G formula based on SCr of 1.17 mg/dL (H)). Liver & Pancreas: Recent Labs  Lab 08/24/23 1610  ALBUMIN 3.2*   No results for input(s): "LIPASE", "AMYLASE" in the last 168 hours. No results for input(s): "AMMONIA" in the last 168 hours. Diabetic: No results for input(s): "HGBA1C" in the last 72 hours.  Recent Labs  Lab 08/29/23 0815 08/29/23 1149 08/29/23 1654 08/29/23 2012 08/30/23 0826  GLUCAP 89 183* 106* 198* 93   Cardiac Enzymes: No results for input(s): "CKTOTAL", "CKMB", "CKMBINDEX", "TROPONINI" in the last 168 hours. No results for input(s): "PROBNP" in the last 8760 hours. Coagulation Profile: No results for input(s): "INR", "PROTIME" in the last 168 hours. Thyroid Function Tests: No results for input(s): "TSH", "T4TOTAL", "FREET4", "T3FREE", "THYROIDAB" in the last 72 hours.  Lipid Profile: No results for input(s): "CHOL", "HDL", "LDLCALC",  "TRIG", "CHOLHDL", "LDLDIRECT" in the last 72 hours. Anemia Panel: No results for input(s): "VITAMINB12", "FOLATE", "FERRITIN", "TIBC", "IRON", "RETICCTPCT" in the last 72 hours.  Urine analysis:    Component Value Date/Time   COLORURINE YELLOW 08/12/2023 1752   APPEARANCEUR HAZY (A) 08/12/2023 1752   LABSPEC 1.021 08/12/2023 1752   PHURINE 6.0 08/12/2023 1752   GLUCOSEU NEGATIVE 08/12/2023 1752   HGBUR NEGATIVE 08/12/2023 1752   BILIRUBINUR NEGATIVE 08/12/2023 1752   KETONESUR NEGATIVE 08/12/2023 1752   PROTEINUR 30 (A) 08/12/2023 1752   NITRITE NEGATIVE 08/12/2023 1752   LEUKOCYTESUR LARGE (A) 08/12/2023 1752   Sepsis Labs: Invalid input(s): "PROCALCITONIN", "LACTICIDVEN"  Microbiology: No results found for this or any previous visit (from the past 240 hours).  Radiology Studies: No results found.      Stanton Kissoon T. Edrees Valent [facility]  If 7PM-7AM, please contact night-coverage [email or web address] 08/30/2023, 11:38 AM

## 2023-08-30 NOTE — Plan of Care (Signed)
  Problem: Health Behavior/Discharge Planning: Goal: Ability to manage health-related needs will improve Outcome: Progressing   Problem: Activity: Goal: Risk for activity intolerance will decrease Outcome: Progressing   Problem: Skin Integrity: Goal: Risk for impaired skin integrity will decrease Outcome: Progressing   Problem: Safety: Goal: Non-violent Restraint(s) Outcome: Progressing

## 2023-08-31 DIAGNOSIS — N3000 Acute cystitis without hematuria: Secondary | ICD-10-CM | POA: Diagnosis not present

## 2023-08-31 DIAGNOSIS — I10 Essential (primary) hypertension: Secondary | ICD-10-CM | POA: Diagnosis not present

## 2023-08-31 DIAGNOSIS — F0390 Unspecified dementia without behavioral disturbance: Secondary | ICD-10-CM | POA: Diagnosis not present

## 2023-08-31 DIAGNOSIS — G9341 Metabolic encephalopathy: Secondary | ICD-10-CM | POA: Diagnosis not present

## 2023-08-31 LAB — GLUCOSE, CAPILLARY
Glucose-Capillary: 130 mg/dL — ABNORMAL HIGH (ref 70–99)
Glucose-Capillary: 171 mg/dL — ABNORMAL HIGH (ref 70–99)
Glucose-Capillary: 163 mg/dL — ABNORMAL HIGH (ref 70–99)

## 2023-08-31 MED ORDER — VITAMIN D3 25 MCG PO TABS: 2000.0000 [IU] | ORAL_TABLET | Freq: Every day | ORAL | 0 refills | Status: AC

## 2023-08-31 MED ORDER — METFORMIN HCL 500 MG PO TABS
500.0000 mg | ORAL_TABLET | Freq: Two times a day (BID) | ORAL | Status: DC
  Administered 2023-08-31 – 2023-09-12 (×18): 500 mg via ORAL
  Filled 2023-08-31 (×22): qty 1

## 2023-08-31 MED ORDER — OLANZAPINE 2.5 MG PO TABS: ORAL_TABLET | ORAL | 0 refills | Status: DC

## 2023-08-31 MED ORDER — CYANOCOBALAMIN 1000 MCG PO TABS: 1000.0000 ug | ORAL_TABLET | Freq: Every day | ORAL | 0 refills | Status: AC

## 2023-08-31 MED ORDER — AMLODIPINE BESYLATE 10 MG PO TABS
10.0000 mg | ORAL_TABLET | Freq: Every day | ORAL | 0 refills | Status: DC
Start: 2023-09-01 — End: 2023-09-12

## 2023-08-31 MED ORDER — METFORMIN HCL 500 MG PO TABS: 500.0000 mg | ORAL_TABLET | Freq: Two times a day (BID) | ORAL | 1 refills | Status: AC

## 2023-08-31 MED ORDER — ACETAMINOPHEN 325 MG PO TABS: 650.0000 mg | ORAL_TABLET | Freq: Four times a day (QID) | ORAL | Status: AC | PRN

## 2023-08-31 NOTE — Progress Notes (Signed)
Mobility Specialist Progress Note:   08/31/23 1106  Mobility  Activity Ambulated independently in hallway  Level of Assistance Independent  Assistive Device None  Distance Ambulated (ft) 550 ft  Activity Response Tolerated well  Mobility Referral Yes  Mobility visit 1 Mobility  Mobility Specialist Start Time (ACUTE ONLY) 0955  Mobility Specialist Stop Time (ACUTE ONLY) 1010  Mobility Specialist Time Calculation (min) (ACUTE ONLY) 15 min   Pt received ambulating in hallway, agreeable for MS to join. Still maxA VC for redirection d/t poor cognition. No complaints stated during ambulation. Pt left with NT assisting pt.   Leory Plowman  Mobility Specialist Please contact via [employer] office at [phone]

## 2023-08-31 NOTE — Progress Notes (Signed)
PROGRESS NOTE  Shannon Barrett ZOX:[id number] DOB: 1955-08-17   PCP: Health, [facility]  Patient is from: Home  DOA: 08/12/2023 LOS: 19  Chief complaints Chief Complaint  Patient presents with   Altered Mental Status     Brief Narrative / Interim history: 68 y.o. F with PMH of DM-2 and HTN brought to ED due to confusion, visual and auditory hallucination.  Reportedly with known bedbugs and bites all over.  Per POA report to triage nurse, patient was not showering or taking her medications.  Patient does confused and only oriented to self on presentation.  In ED, slightly hypertensive.  Basic labs without significant finding.  UA with large LE and rare bacteria.  UDS positive for benzo.  Does not seem to prescription for benzo.  CT head and CXR without significant finding.  Patient was given IM Geodon 10 mg x 1, IM Haldol 2 mg x 1, Benadryl 25 mg p.o. x 1 and IV ceftriaxone.  Admission requested for acute encephalopathy in the setting of presumed UTI.   Patient continued to be confused and agitated at times.  Per discussion with patient's daughter, rapid cognitive decline over the last 2 months.  Neurology consulted and recommended MRI brain with and transferred to [facility] for further workup.  EEG negative for seizure or epilepsy.  MRI brain with/without contrast attempted but only diffusion sequence obtained and is reassuring.  CT chest/abdomen/pelvis did not show malignancy.  Bedside LP unsuccessful on 1/29.  She underwent fluoroscopy guided LP on 1/30.  CSF studies including chemistry, cell count, meningitis, encephalopathy test and paraneoplastic panel negative. Paraneoplastic/autoimmune panel pending.  Neurology signed off.  Therapy recommended SNF.  TOC working on ALF.  Seems to be stable and calm off restraints and safety sitter.  Subjective: Seen and examined earlier this morning.  No major events overnight of this morning.  She is sleeping.  No restraints.  No Retail buyer.  She woke up and asked for some sandwiches  and went back to bed.  Objective: Vitals:   08/30/23 2112 08/31/23 0557 08/31/23 0801 08/31/23 0902  BP: 135/75 117/85 135/72 135/72  Pulse: 100 92 88   Resp:  18 16   Temp:  98.3 F (36.8 C) 98.2 F (36.8 C)   TempSrc:  Oral Oral   SpO2: 100% 100% 100%   Weight:      Height:        Examination:  GENERAL: No apparent distress.  Nontoxic.  HEENT: MMM.  Vision and hearing grossly intact.  NECK: Supple.  No apparent JVD.  RESP:  No IWOB.  Fair aeration bilaterally. CVS:  RRR. Heart sounds normal.  ABD/GI/GU: BS+. Abd soft, NTND.  MSK/EXT:  Moves extremities. No apparent deformity. No edema.  No soft restraints. SKIN: no apparent skin lesion or wound NEURO: Sleepy but wakes to voice.  Oriented x 1.  Follows commands. PSYCH: Calm.  No distress or agitation.   Procedures:  None  Microbiology summarized: Urine culture with multiple species. CSF culture NGTD  Assessment and plan: Severe dementia with behavioral disturbance: Presents with confusion/agitation/audiovisual hallucination.  Daughter noted some cognitive decline over the last 1 year that has progressed quickly in the last 2 months.  She was confused, hallucinating, wandering off and not caring for herself.  At times, she does not even recognize her daughter.  She stopped taking her regular meds.  Has no PCP but goes to health department times. UDS positive for benzo without prescription.  UA  with pyuria but she does not have fever or leukocytosis.  No suprapubic tenderness either.  CTH, RPR, CRP, HIV, TSH, B6, A1, folate and B12 within normal.  EEG with mild diffuse encephalopathy but no seizure or epileptiform discharge.  MRI brain as above.  CT chest/abdomen/pelvis negative for malignancy. Agitation seems to have resolved with low-dose Zyprexa. S/p fluoroscopy guided LMP on 1/30.  CSF studies including chemistry, cell count, culture, meningitis, encephalitis and  paraneoplastic panels negative. -S/p high-dose thiamine, B12 injection.   -Continue p.o. B12, B1, and vitamin D. -Completed antibiotic course for possible UTI/pyuria. -Continue delirium precaution -Continue low-dose Zyprexa.  Did not require as needed IV Haldol. -Seems to be stable off soft restraints for few days now.  Monitor closely -Neurology and psychiatry signed off.  Pyuria: Unclear if this is UTI or just pyuria.  She has no fever, leukocytosis or suprapubic tenderness.  Urine culture with multiple species.  Received ceftriaxone from 1/24-1/27.  AKI: Likely prerenal from poor p.o. intake, lisinopril and HCTZ.  AKI improved. Recent Labs    08/12/23 1839 08/13/23 0622 08/14/23 0610 08/15/23 0601 08/16/23 4098 08/17/23 0737 08/18/23 0521 08/20/23 0600 08/24/23 0632 08/30/23 1508  BUN 14 14 12  27* 28* 29* 19 33* 32* 29*  CREATININE 0.89 0.81 1.11* 1.36* 1.11* 1.23* 1.02* 1.06* 1.17* 1.08*  -Lisinopril/HCTZ discontinued on 1/27 -Monitor renal function intermittently. -Ensure hydration.  May need intermittent boluses.   NIDDM-2: On metformin at home but does not take.  A1c 6.2%.  CBG has been within acceptable range. -Discontinue CBG monitoring and SSI -Start low-dose metformin 500 mg twice daily -Liberate diet  Essential hypertension: Blood pressure seems to fluctuate likely technical -Discontinued lisinopril and HCTZ due to rising creatinine. -Continue amlodipine 10 mg daily -P.o. hydralazine as needed  Microcytic anemia: Stable.  Anemia panel with mild iron deficiency.  B12 305. Recent Labs    08/12/23 1839 08/13/23 0622 08/14/23 0610 08/15/23 0601 08/16/23 1191 08/17/23 0737 08/18/23 0521 08/20/23 0600 08/24/23 0632 08/30/23 1508  HGB 12.4 11.9* 13.3 13.3 12.6 12.9 14.4 13.0 13.3 12.4  -B12 supplementation as above -Continue monitoring  Physical deconditioning -Ambulate daily.  Bedbug infestation -Continue contact precaution  Advance care planning:  Discussed CODE STATUS with patient's daughter.  Patient is full code.  Body mass index is 26.17 kg/m.  Pressure skin injury: Not POA Pressure Injury 08/21/23 Elbow Left;Posterior Stage 2 -  Partial thickness loss of dermis presenting as a shallow open injury with a red, pink wound bed without slough. (Active)  08/21/23 1346  Location: Elbow  Location Orientation: Left;Posterior  Staging: Stage 2 -  Partial thickness loss of dermis presenting as a shallow open injury with a red, pink wound bed without slough.  Wound Description (Comments):   Present on Admission: No  Dressing Type Foam - Lift dressing to assess site every shift 08/23/23 2158     Pressure Injury 08/21/23 Elbow Posterior;Right Stage 2 -  Partial thickness loss of dermis presenting as a shallow open injury with a red, pink wound bed without slough. (Active)  08/21/23 1348  Location: Elbow  Location Orientation: Posterior;Right  Staging: Stage 2 -  Partial thickness loss of dermis presenting as a shallow open injury with a red, pink wound bed without slough.  Wound Description (Comments):   Present on Admission: No  Dressing Type Foam - Lift dressing to assess site every shift 08/23/23 2158   DVT prophylaxis:  SCDs Start: 08/13/23 0543  Code Status: Full code Family Communication: None at  bedside today. Level of care: Med-Surg Status is: Inpatient Remains inpatient appropriate because: ALF.   Final disposition: ALF after QuantiFERON gold test result Consultants:  Neurology Psychiatry  25 minutes with more than 50% spent in reviewing records, counseling patient/family and coordinating care.   Sch Meds:  Scheduled Meds:  amLODipine  10 mg Oral Daily   cholecalciferol  2,000 Units Oral Daily   vitamin B-12  1,000 mcg Oral Daily   feeding supplement  237 mL Oral BID BM   metFORMIN  500 mg Oral BID WC   OLANZapine  2.5 mg Oral q AM   And   OLANZapine  5 mg Oral QHS   Continuous Infusions:    PRN  Meds:.acetaminophen **OR** acetaminophen, haloperidol, haloperidol lactate **OR** haloperidol lactate, hydrALAZINE, ondansetron **OR** ondansetron (ZOFRAN) IV, mouth rinse  Antimicrobials: Anti-infectives (From admission, onward)    Start     Dose/Rate Route Frequency Ordered Stop   08/13/23 1000  cefTRIAXone (ROCEPHIN) 1 g in sodium chloride 0.9 % 100 mL IVPB  Status:  Discontinued        1 g 200 mL/hr over 30 Minutes Intravenous Every 24 hours 08/13/23 0530 08/15/23 1424   08/12/23 2300  cefTRIAXone (ROCEPHIN) 2 g in sodium chloride 0.9 % 100 mL IVPB        2 g 200 mL/hr over 30 Minutes Intravenous  Once 08/12/23 2245 08/13/23 0018        I have personally reviewed the following labs and images: CBC: Recent Labs  Lab 08/30/23 1508  WBC 6.2  HGB 12.4  HCT 35.8*  MCV 77.0*  PLT 235   BMP &GFR Recent Labs  Lab 08/30/23 1508  NA 136  K 4.2  CL 96*  CO2 26  GLUCOSE 160*  BUN 29*  CREATININE 1.08*  CALCIUM 9.4  MG 2.3  PHOS 3.2   Estimated Creatinine Clearance: 45.3 mL/min (A) (by C-G formula based on SCr of 1.08 mg/dL (H)). Liver & Pancreas: Recent Labs  Lab 08/30/23 1508  ALBUMIN 3.8   No results for input(s): "LIPASE", "AMYLASE" in the last 168 hours. No results for input(s): "AMMONIA" in the last 168 hours. Diabetic: No results for input(s): "HGBA1C" in the last 72 hours.  Recent Labs  Lab 08/30/23 1217 08/30/23 1728 08/30/23 2104 08/31/23 0814 08/31/23 1147  GLUCAP 138* 140* 124* 130* 171*   Cardiac Enzymes: No results for input(s): "CKTOTAL", "CKMB", "CKMBINDEX", "TROPONINI" in the last 168 hours. No results for input(s): "PROBNP" in the last 8760 hours. Coagulation Profile: No results for input(s): "INR", "PROTIME" in the last 168 hours. Thyroid Function Tests: No results for input(s): "TSH", "T4TOTAL", "FREET4", "T3FREE", "THYROIDAB" in the last 72 hours.  Lipid Profile: No results for input(s): "CHOL", "HDL", "LDLCALC", "TRIG", "CHOLHDL",  "LDLDIRECT" in the last 72 hours. Anemia Panel: No results for input(s): "VITAMINB12", "FOLATE", "FERRITIN", "TIBC", "IRON", "RETICCTPCT" in the last 72 hours.  Urine analysis:    Component Value Date/Time   COLORURINE YELLOW 08/12/2023 1752   APPEARANCEUR HAZY (A) 08/12/2023 1752   LABSPEC 1.021 08/12/2023 1752   PHURINE 6.0 08/12/2023 1752   GLUCOSEU NEGATIVE 08/12/2023 1752   HGBUR NEGATIVE 08/12/2023 1752   BILIRUBINUR NEGATIVE 08/12/2023 1752   KETONESUR NEGATIVE 08/12/2023 1752   PROTEINUR 30 (A) 08/12/2023 1752   NITRITE NEGATIVE 08/12/2023 1752   LEUKOCYTESUR LARGE (A) 08/12/2023 1752   Sepsis Labs: Invalid input(s): "PROCALCITONIN", "LACTICIDVEN"  Microbiology: No results found for this or any previous visit (from the past 240 hours).  Radiology Studies: No results found.      Samier Jaco T. Ishi Danser [facility]  If 7PM-7AM, please contact night-coverage [email or web address] 08/31/2023, 11:53 AM

## 2023-08-31 NOTE — [place] FL2 (Signed)
[place] MEDICAID FL2 LEVEL OF CARE FORM     IDENTIFICATION  Patient Name: Shannon Barrett Birthdate: [birth year] Sex: female Admission Date (Current Location): 08/12/2023  [facility] and [place] Number:  Producer, television/film/video and Address:  [facility] [place], [address], [place], [place] [address]      Provider Number: [id number]  Attending Physician Name and Address:  Almon Hercules, MD  Relative Name and Phone Number:  Caryl Ada [phone]    Current Level of Care: Hospital Recommended Level of Care: Memory Care Prior Approval Number:    Date Approved/Denied:   PASRR Number: N/A  Discharge Plan: Other (Comment) (Memory Care)    Current Diagnoses: Patient Active Problem List   Diagnosis Date Noted   Acute metabolic encephalopathy 08/13/2023   Microcytic anemia 08/13/2023   Essential hypertension 08/13/2023   Type 2 diabetes mellitus (HCC) 08/13/2023   UTI (urinary tract infection) 08/12/2023    Orientation RESPIRATION BLADDER Height & Weight     Self  Normal Continent Weight: 143 lb 1.3 oz (64.9 kg) Height:  5\' 2"  (157.5 cm)  BEHAVIORAL SYMPTOMS/MOOD NEUROLOGICAL Dementia BOWEL NUTRITION STATUS  Wanderer   Continent Diet (Regular)  AMBULATORY STATUS COMMUNICATION OF NEEDS Skin   Supervision Verbally PU Stage and Appropriate Care (Bilateral Elbow PU St 2)                       Personal Care Assistance Level of Assistance  Bathing, Feeding, Dressing Bathing Assistance: Limited assistance Feeding assistance: Limited assistance Dressing Assistance: Limited assistance     Functional Limitations Info  Sight, Hearing, Speech Sight Info: Adequate Hearing Info: Adequate Speech Info: Adequate    SPECIAL CARE FACTORS FREQUENCY  PT (By licensed PT), OT (By licensed OT)     PT Frequency: 1x week OT Frequency: 1x week            Contractures Contractures Info: Not present    Additional Factors Info  Code Status,  Allergies, Psychotropic Code Status Info: Full Allergies Info: Amoxicillin  Aspirin  Claritin (Loratadine)  Ibuprofen  Peanut-containing Drug Products  Penicillins  Shellfish-derived Products  Strawberry (Diagnostic) Psychotropic Info: Olanzapine Insulin Sliding Scale Info: insulin aspart (novoLOG) injection 0-6 Units  Dose: 0-6 Units  Freq: 3 times daily with meals Route: [place]       Current Medications (08/31/2023):  This is the current hospital active medication list Current Facility-Administered Medications  Medication Dose Route Frequency Provider Last Rate Last Admin   acetaminophen (TYLENOL) tablet 650 mg  650 mg Oral Q6H PRN Adefeso, Oladapo, DO   650 mg at 08/26/23 2016   Or   acetaminophen (TYLENOL) suppository 650 mg  650 mg Rectal Q6H PRN Adefeso, Oladapo, DO       amLODipine (NORVASC) tablet 10 mg  10 mg Oral Daily Candelaria Stagers T, MD   10 mg at 08/31/23 0902   cholecalciferol (VITAMIN D3) 25 MCG (1000 UNIT) tablet 2,000 Units  2,000 Units Oral Daily Candelaria Stagers T, MD   2,000 Units at 08/31/23 0902   cyanocobalamin (VITAMIN B12) tablet 1,000 mcg  1,000 mcg Oral Daily Candelaria Stagers T, MD   1,000 mcg at 08/31/23 0903   feeding supplement (ENSURE ENLIVE / ENSURE PLUS) liquid 237 mL  237 mL Oral BID BM Gonfa, Taye T, MD   237 mL at 08/31/23 0903   haloperidol (HALDOL) tablet 0.5 mg  0.5 mg Oral Q8H PRN Kathlen Mody, MD  0.5 mg at 08/28/23 1323   haloperidol lactate (HALDOL) injection 2 mg  2 mg Intravenous Q6H PRN Candelaria Stagers T, MD   2 mg at 08/20/23 2047   Or   haloperidol lactate (HALDOL) injection 2 mg  2 mg Intramuscular Q6H PRN Almon Hercules, MD   2 mg at 08/29/23 1334   hydrALAZINE (APRESOLINE) injection 10 mg  10 mg Intravenous Q6H PRN Adefeso, Oladapo, DO       metFORMIN (GLUCOPHAGE) tablet 500 mg  500 mg Oral BID WC Gonfa, Taye T, MD       OLANZapine (ZYPREXA) tablet 2.5 mg  2.5 mg Oral q AM Candelaria Stagers T, MD   2.5 mg at 08/31/23 0981   And   OLANZapine (ZYPREXA) tablet 5 mg  5  mg Oral QHS Candelaria Stagers T, MD   5 mg at 08/30/23 2225   ondansetron (ZOFRAN) tablet 4 mg  4 mg Oral Q6H PRN Adefeso, Oladapo, DO       Or   ondansetron (ZOFRAN) injection 4 mg  4 mg Intravenous Q6H PRN Adefeso, Oladapo, DO       Oral care mouth rinse  15 mL Mouth Rinse PRN Almon Hercules, MD         Discharge Medications:  Date/Time Order Dose Route Action Action by    08/31/2023 0903 EST cyanocobalamin (VITAMIN B12) tablet 1,000 mcg 1,000 mcg Oral Given GD   08/31/2023 0903 EST feeding supplement (ENSURE ENLIVE / ENSURE PLUS) liquid 237 mL 237 mL Oral Given GD   08/31/2023 0902 EST amLODipine (NORVASC) tablet 10 mg 10 mg Oral Given GD   08/31/2023 0902 EST cholecalciferol (VITAMIN D3) 25 MCG (1000 UNIT) tablet 2,000 Units 2,000 Units Oral Given GD   08/31/2023 0902 EST OLANZapine (ZYPREXA) tablet 2.5 mg 2.5 mg Oral Given GD   Relevant Imaging Results:  Relevant Lab Results:   Additional Information SS# [id number], [place]

## 2023-08-31 NOTE — TOC Progression Note (Signed)
Transition of Care [facility]) - Progression Note    Patient Details  Name: Shannon Barrett MRN: [id number] Date of Birth: 1955/09/28  Transition [facility] Care [facility]) CM/SW Contact  Carley Hammed, LCSW Phone Number: 08/31/2023, 3:30 PM  Clinical Narrative:    CSW facilitated facetime meeting between facility and pt, family unavailable at the scheduled time. Facility attempted orientation questions, pt unable to participate. All questions were answered and facility requested several changes: Change Diet to Regular Add Dementia to diagnosis list Change sliding scale for insulin Discharge med list provided. MD made all of these changes and a new FL2 and clinicals were sent to facility.  CSW spoke with [facility] and advised of the conversation and what information the facility will need from the family. Facility will call family to set up a tour and discuss documentation. TOC continues to follow.    Expected Discharge Plan: Assisted Living Barriers to Discharge: Continued Medical Work up, English as a second language teacher, Inadequate or no insurance, Unsafe home situation, Other (must enter comment) (MC/ALF placement)  Expected Discharge Plan and Services In-house Referral: Clinical Social Work   Post Acute Care Choice:  (ALF/ MC) Living arrangements for the past 2 months: Apartment                                       Social Determinants of Health (SDOH) Interventions SDOH Screenings   Food Insecurity: Patient Unable To Answer (08/13/2023)  Housing: Patient Unable To Answer (08/13/2023)  Transportation Needs: Patient Unable To Answer (08/13/2023)  Utilities: Patient Unable To Answer (08/13/2023)  Social Connections: Unknown (08/13/2023)  Tobacco Use: Low Risk  (08/12/2023)    Readmission Risk Interventions     No data to display

## 2023-09-01 DIAGNOSIS — G9341 Metabolic encephalopathy: Secondary | ICD-10-CM | POA: Diagnosis not present

## 2023-09-01 DIAGNOSIS — I10 Essential (primary) hypertension: Secondary | ICD-10-CM | POA: Diagnosis not present

## 2023-09-01 DIAGNOSIS — N3000 Acute cystitis without hematuria: Secondary | ICD-10-CM | POA: Diagnosis not present

## 2023-09-01 DIAGNOSIS — F0390 Unspecified dementia without behavioral disturbance: Secondary | ICD-10-CM | POA: Diagnosis not present

## 2023-09-01 NOTE — Plan of Care (Signed)
  Problem: Clinical Measurements: Goal: Will remain free from infection Outcome: Progressing Goal: Diagnostic test results will improve Outcome: Progressing   Problem: Health Behavior/Discharge Planning: Goal: Ability to manage health-related needs will improve Outcome: Not Progressing   Problem: Clinical Measurements: Goal: Ability to maintain clinical measurements within normal limits will improve Outcome: Not Progressing

## 2023-09-01 NOTE — Progress Notes (Signed)
PROGRESS NOTE  Shannon Barrett ZOX:[id number] DOB: Jun 07, 1956   PCP: [facility]  Patient is from: Home  DOA: 08/12/2023 LOS: 20  Chief complaints Chief Complaint  Patient presents with   Altered Mental Status     Brief Narrative / Interim history: 68 y.o. F with PMH of DM-2 and HTN brought to ED due to confusion, visual and auditory hallucination.  Reportedly with known bedbugs and bites all over.  Per POA report to triage nurse, patient was not showering or taking her medications.  Patient does confused and only oriented to self on presentation.  In ED, slightly hypertensive.  Basic labs without significant finding.  UA with large LE and rare bacteria.  UDS positive for benzo.  Does not seem to prescription for benzo.  CT head and CXR without significant finding.  Patient was given IM Geodon 10 mg x 1, IM Haldol 2 mg x 1, Benadryl 25 mg p.o. x 1 and IV ceftriaxone.  Admission requested for acute encephalopathy in the setting of presumed UTI.   Patient continued to be confused and agitated at times.  Per discussion with patient's daughter, rapid cognitive decline over the last 2 months.  Neurology consulted and recommended MRI brain with and transferred to [facility] for further workup.  EEG negative for seizure or epilepsy.  MRI brain with/without contrast attempted but only diffusion sequence obtained and is reassuring.  CT chest/abdomen/pelvis did not show malignancy.  Bedside LP unsuccessful on 1/29.  She underwent fluoroscopy guided LP on 1/30.  CSF studies including chemistry, cell count, meningitis, encephalopathy test and paraneoplastic panel negative. Paraneoplastic/autoimmune panel pending.  Neurology and psychiatry signed off.  Therapy recommended SNF.  TOC working on ALF.  Stable and calm without restraints and safety sitter for days now.  Subjective: Seen and examined earlier this morning.  No major events overnight of this morning.  Sleeping  soundly.  Objective: Vitals:   08/31/23 0902 08/31/23 2200 09/01/23 1011 09/01/23 1017  BP: 135/72 126/72 (!) 133/92 (!) 133/92  Pulse:  (!) 102  88  Resp:  17  18  Temp:  97.8 F (36.6 C)  98 F (36.7 C)  TempSrc:    Oral  SpO2:  99%    Weight:      Height:        Examination:  GENERAL: No apparent distress.  Nontoxic.  HEENT: MMM.  Vision and hearing grossly intact.  NECK: Supple.  No apparent JVD.  RESP:  No IWOB.  Fair aeration bilaterally. CVS:  RRR. Heart sounds normal.  ABD/GI/GU: BS+. Abd soft, NTND.  MSK/EXT:  Moves extremities. No apparent deformity. No edema.  No soft restraints. SKIN: no apparent skin lesion or wound NEURO: Sleepy.  PSYCH: Calm and sleepy.  Procedures:  None  Microbiology summarized: Urine culture with multiple species. CSF culture NGTD  Assessment and plan: Severe dementia with behavioral disturbance: Presents with confusion/agitation/audiovisual hallucination.  Daughter noted some cognitive decline over the last 1 year that has progressed quickly in the last 2 months.  She was confused, hallucinating, wandering off and not caring for herself.  At times, she does not even recognize her daughter.  She stopped taking her regular meds.  Has no PCP but goes to health department times. UDS positive for benzo without prescription.  UA with pyuria but she does not have fever or leukocytosis.  No suprapubic tenderness either.  CTH, RPR, CRP, HIV, TSH, B6, A1, folate and B12 within normal.  EEG with mild  diffuse encephalopathy but no seizure or epileptiform discharge.  MRI brain as above.  CT chest/abdomen/pelvis negative for malignancy. Agitation seems to have resolved with low-dose Zyprexa. S/p fluoroscopy guided LMP on 1/30.  CSF studies including chemistry, cell count, culture, meningitis, encephalitis and paraneoplastic panels negative. -S/p high-dose thiamine, B12 injection.   -Continue p.o. B12, B1, and vitamin D. -Completed antibiotic course for  possible UTI/pyuria. -Continue delirium precaution -Continue low-dose Zyprexa.  Did not require as needed IV Haldol. -Seems to be stable off soft restraints for few days now.  Monitor closely -Neurology and psychiatry signed off.  Pyuria: Unclear if this is UTI or just pyuria.  She has no fever, leukocytosis or suprapubic tenderness.  Urine culture with multiple species.  Received ceftriaxone from 1/24-1/27.  AKI: Likely prerenal from poor p.o. intake, lisinopril and HCTZ.  AKI improved. Recent Labs    08/12/23 1839 08/13/23 0622 08/14/23 0610 08/15/23 0601 08/16/23 1610 08/17/23 0737 08/18/23 0521 08/20/23 0600 08/24/23 0632 08/30/23 1508  BUN 14 14 12  27* 28* 29* 19 33* 32* 29*  CREATININE 0.89 0.81 1.11* 1.36* 1.11* 1.23* 1.02* 1.06* 1.17* 1.08*  -Lisinopril/HCTZ discontinued on 1/27 -Monitor renal function intermittently. -Ensure hydration.  May need intermittent boluses.   NIDDM-2: On metformin at home but does not take.  A1c 6.2%.  CBG has been within acceptable range. -Discontinue CBG monitoring and SSI -Start low-dose metformin 500 mg twice daily -Liberate diet  Essential hypertension: Blood pressure seems to fluctuate likely technical -Discontinued lisinopril and HCTZ due to rising creatinine. -Continue amlodipine 10 mg daily -P.o. hydralazine as needed  Microcytic anemia: Stable.  Anemia panel with mild iron deficiency.  B12 305. Recent Labs    08/12/23 1839 08/13/23 0622 08/14/23 0610 08/15/23 0601 08/16/23 9604 08/17/23 0737 08/18/23 0521 08/20/23 0600 08/24/23 0632 08/30/23 1508  HGB 12.4 11.9* 13.3 13.3 12.6 12.9 14.4 13.0 13.3 12.4  -B12 supplementation as above -Continue monitoring  Physical deconditioning -Ambulate daily.  Bedbug infestation -Continue contact precaution  Advance care planning: Discussed CODE STATUS with patient's daughter.  Patient is full code.  Body mass index is 26.17 kg/m.  Pressure skin injury: Not POA Pressure  Injury 08/21/23 Elbow Left;Posterior Stage 2 -  Partial thickness loss of dermis presenting as a shallow open injury with a red, pink wound bed without slough. (Active)  08/21/23 1346  Location: Elbow  Location Orientation: Left;Posterior  Staging: Stage 2 -  Partial thickness loss of dermis presenting as a shallow open injury with a red, pink wound bed without slough.  Wound Description (Comments):   Present on Admission: No  Dressing Type Foam - Lift dressing to assess site every shift 09/01/23 0836     Pressure Injury 08/21/23 Elbow Posterior;Right Stage 2 -  Partial thickness loss of dermis presenting as a shallow open injury with a red, pink wound bed without slough. (Active)  08/21/23 1348  Location: Elbow  Location Orientation: Posterior;Right  Staging: Stage 2 -  Partial thickness loss of dermis presenting as a shallow open injury with a red, pink wound bed without slough.  Wound Description (Comments):   Present on Admission: No  Dressing Type Foam - Lift dressing to assess site every shift 09/01/23 0836   DVT prophylaxis:  SCDs Start: 08/13/23 0543  Code Status: Full code Family Communication: None at bedside today. Level of care: Med-Surg Status is: Inpatient Remains inpatient appropriate because: ALF.   Final disposition: ALF after QuantiFERON gold test result Consultants:  Neurology Psychiatry  25 minutes with  more than 50% spent in reviewing records, counseling patient/family and coordinating care.   Sch Meds:  Scheduled Meds:  amLODipine  10 mg Oral Daily   cholecalciferol  2,000 Units Oral Daily   vitamin B-12  1,000 mcg Oral Daily   feeding supplement  237 mL Oral BID BM   metFORMIN  500 mg Oral BID WC   OLANZapine  2.5 mg Oral q AM   And   OLANZapine  5 mg Oral QHS   Continuous Infusions:    PRN Meds:.acetaminophen **OR** acetaminophen, haloperidol, haloperidol lactate **OR** haloperidol lactate, hydrALAZINE, ondansetron **OR** ondansetron (ZOFRAN)  IV, mouth rinse  Antimicrobials: Anti-infectives (From admission, onward)    Start     Dose/Rate Route Frequency Ordered Stop   08/13/23 1000  cefTRIAXone (ROCEPHIN) 1 g in sodium chloride 0.9 % 100 mL IVPB  Status:  Discontinued        1 g 200 mL/hr over 30 Minutes Intravenous Every 24 hours 08/13/23 0530 08/15/23 1424   08/12/23 2300  cefTRIAXone (ROCEPHIN) 2 g in sodium chloride 0.9 % 100 mL IVPB        2 g 200 mL/hr over 30 Minutes Intravenous  Once 08/12/23 2245 08/13/23 0018        I have personally reviewed the following labs and images: CBC: Recent Labs  Lab 08/30/23 1508  WBC 6.2  HGB 12.4  HCT 35.8*  MCV 77.0*  PLT 235   BMP &GFR Recent Labs  Lab 08/30/23 1508  NA 136  K 4.2  CL 96*  CO2 26  GLUCOSE 160*  BUN 29*  CREATININE 1.08*  CALCIUM 9.4  MG 2.3  PHOS 3.2   Estimated Creatinine Clearance: 45.3 mL/min (A) (by C-G formula based on SCr of 1.08 mg/dL (H)). Liver & Pancreas: Recent Labs  Lab 08/30/23 1508  ALBUMIN 3.8   No results for input(s): "LIPASE", "AMYLASE" in the last 168 hours. No results for input(s): "AMMONIA" in the last 168 hours. Diabetic: No results for input(s): "HGBA1C" in the last 72 hours.  Recent Labs  Lab 08/30/23 1728 08/30/23 2104 08/31/23 0814 08/31/23 1147 08/31/23 2204  GLUCAP 140* 124* 130* 171* 163*   Cardiac Enzymes: No results for input(s): "CKTOTAL", "CKMB", "CKMBINDEX", "TROPONINI" in the last 168 hours. No results for input(s): "PROBNP" in the last 8760 hours. Coagulation Profile: No results for input(s): "INR", "PROTIME" in the last 168 hours. Thyroid Function Tests: No results for input(s): "TSH", "T4TOTAL", "FREET4", "T3FREE", "THYROIDAB" in the last 72 hours.  Lipid Profile: No results for input(s): "CHOL", "HDL", "LDLCALC", "TRIG", "CHOLHDL", "LDLDIRECT" in the last 72 hours. Anemia Panel: No results for input(s): "VITAMINB12", "FOLATE", "FERRITIN", "TIBC", "IRON", "RETICCTPCT" in the last 72  hours.  Urine analysis:    Component Value Date/Time   COLORURINE YELLOW 08/12/2023 1752   APPEARANCEUR HAZY (A) 08/12/2023 1752   LABSPEC 1.021 08/12/2023 1752   PHURINE 6.0 08/12/2023 1752   GLUCOSEU NEGATIVE 08/12/2023 1752   HGBUR NEGATIVE 08/12/2023 1752   BILIRUBINUR NEGATIVE 08/12/2023 1752   KETONESUR NEGATIVE 08/12/2023 1752   PROTEINUR 30 (A) 08/12/2023 1752   NITRITE NEGATIVE 08/12/2023 1752   LEUKOCYTESUR LARGE (A) 08/12/2023 1752   Sepsis Labs: Invalid input(s): "PROCALCITONIN", "LACTICIDVEN"  Microbiology: No results found for this or any previous visit (from the past 240 hours).   Radiology Studies: No results found.      Raistlin Gum T. Tomasa Dobransky [facility]  If 7PM-7AM, please contact night-coverage [email or web address] 09/01/2023, 3:10 PM

## 2023-09-01 NOTE — TOC Progression Note (Signed)
Transition of Care [facility]) - Progression Note    Patient Details  Name: Shannon Barrett MRN: [id number] Date of Birth: [birth year]  [facility]) CM/SW Contact  Carley Hammed, LCSW Phone Number: 09/01/2023, 1:49 PM  Clinical Narrative:     Zeb Comfort continues to review and work with family on specifics. Quanteferon still pending. TOC will continue to follow.   Expected Discharge Plan: Assisted Living Barriers to Discharge: Continued Medical Work up, English as a second language teacher, Inadequate or no insurance, Unsafe home situation, Other (must enter comment) (MC/ALF placement)  Expected Discharge Plan and Services In-house Referral: Clinical Social Work   Post Acute Care Choice:  (ALF/ MC) Living arrangements for the past 2 months: Apartment                                       Social Determinants of Health (SDOH) Interventions SDOH Screenings   Food Insecurity: Patient Unable To Answer (08/13/2023)  Housing: Patient Unable To Answer (08/13/2023)  Transportation Needs: Patient Unable To Answer (08/13/2023)  Utilities: Patient Unable To Answer (08/13/2023)  Social Connections: Unknown (08/13/2023)  Tobacco Use: Low Risk  (08/12/2023)    Readmission Risk Interventions     No data to display

## 2023-09-01 NOTE — Plan of Care (Signed)
  Problem: Activity: Goal: Risk for activity intolerance will decrease Outcome: Progressing   Problem: Skin Integrity: Goal: Risk for impaired skin integrity will decrease Outcome: Progressing   Problem: Safety: Goal: Non-violent Restraint(s) Outcome: Progressing   Problem: Fluid Volume: Goal: Ability to maintain a balanced intake and output will improve Outcome: Progressing   Problem: Skin Integrity: Goal: Risk for impaired skin integrity will decrease Outcome: Progressing   Problem: Safety: Goal: Non-violent Restraint(s) Outcome: Progressing

## 2023-09-02 DIAGNOSIS — N3000 Acute cystitis without hematuria: Secondary | ICD-10-CM | POA: Diagnosis not present

## 2023-09-02 MED ORDER — HALOPERIDOL LACTATE 5 MG/ML IJ SOLN
1.0000 mg | Freq: Once | INTRAMUSCULAR | Status: DC
  Filled 2023-09-02: qty 1

## 2023-09-02 MED ORDER — HALOPERIDOL 1 MG PO TABS
1.0000 mg | ORAL_TABLET | Freq: Three times a day (TID) | ORAL | Status: DC | PRN
  Administered 2023-09-02 – 2023-09-03 (×2): 1 mg via ORAL
  Filled 2023-09-02 (×3): qty 1

## 2023-09-02 MED ORDER — OLANZAPINE 5 MG PO TABS
5.0000 mg | ORAL_TABLET | Freq: Every morning | ORAL | Status: DC
  Administered 2023-09-03 – 2023-09-05 (×3): 5 mg via ORAL
  Filled 2023-09-02 (×2): qty 1

## 2023-09-02 MED ORDER — OLANZAPINE 5 MG PO TABS
10.0000 mg | ORAL_TABLET | Freq: Every day | ORAL | Status: DC
  Administered 2023-09-02 – 2023-09-04 (×3): 10 mg via ORAL
  Filled 2023-09-02 (×3): qty 2

## 2023-09-02 NOTE — TOC Progression Note (Signed)
Transition of Care [facility]) - Progression Note    Patient Details  Name: Shannon Barrett MRN: [id number] Date of Birth: 04-Apr-1956  Transition of Care [facility]) CM/SW Contact  Carley Hammed, LCSW Phone Number: 09/02/2023, 11:59 AM  Clinical Narrative:    CSW spoke with DeeDee at [facility] who stated that family will be touring this weekend and they will work with Medicaid on Monday. CSW asked if [facility] had received the updated documentation from CSW. She did not, It was re- sent. CSW spoke with niece Gabriel Rung and confirmed plan. CSW to follow up on Monday to get updates. Quanteferon still pending. TOC will follow.    Expected Discharge Plan: Assisted Living Barriers to Discharge: Continued Medical Work up, English as a second language teacher, Inadequate or no insurance, Unsafe home situation, Other (must enter comment) (MC/ALF placement)  Expected Discharge Plan and Services In-house Referral: Clinical Social Work   Post Acute Care Choice:  (ALF/ MC) Living arrangements for the past 2 months: Apartment                                       Social Determinants of Health (SDOH) Interventions SDOH Screenings   Food Insecurity: Patient Unable To Answer (08/13/2023)  Housing: Patient Unable To Answer (08/13/2023)  Transportation Needs: Patient Unable To Answer (08/13/2023)  Utilities: Patient Unable To Answer (08/13/2023)  Social Connections: Unknown (08/13/2023)  Tobacco Use: Low Risk  (08/12/2023)    Readmission Risk Interventions     No data to display

## 2023-09-02 NOTE — Plan of Care (Signed)
Patient not in restraints, this outcome complete.

## 2023-09-02 NOTE — Progress Notes (Signed)
Shannon Barrett  ZOX:[id number] DOB: 1956-02-28 DOA: 08/12/2023 PCP: Randell Patient [facility]    Brief Narrative:  68 year old with a history of DM2 and HTN who was brought to the ER 08/12/2023 with confusion and visual/auditory hallucinations.  She was found to have bedbugs with multiple bites and was in an unkempt state.  Admission laboratories were unrevealing but UDS was positive for benzodiazepines and UA was equivocal.  CT head was unrevealing.  Over the course of her prolonged hospital stay she has continued to experience episodes of confusion and agitation.  The patient's family reported that they had noted a significant cognitive decline dating back at least 2 months.  EEG was negative for seizure activity.  MRI of the brain was without significant acute findings.  CT chest abdomen and pelvis did not reveal any acute pathology.  An LP was attempted 1/29 but proved to be unsuccessful.  A fluoroscopically guided LP was able to be completed on 1/30 and there was no evidence consistent with a meningitis.  Goals of Care:   Code Status: Full Code   DVT prophylaxis: SCDs Start: 08/13/23 0543   Interim Hx: Afebrile.  Vital signs stable.  CBG well-controlled.  Ambulating in the halls.  Confused but pleasant.  No shortness of breath or or other distress.  Assessment & Plan:  Severe dementia Family reports cognitive decline dating back at least a year with rapid worsening over the last 2 months -extensive workup with no evidence of a reversible etiology -not presently agitated with consistent use of Zyprexa -stable for placement in memory care unit  Pyuria Given clinical situation patient was treated with an empiric course of antibiotic but this had no significant impact on her status  AKI Felt to be most consistent with prerenal state due to dehydration/poor intake  DM2 A1c 6.2 despite recent noncompliance with medications  HTN  Microcytic anemia Likely nutritional in  etiology given malnourished state as well as B12 deficiency -continue B12 supplementation  Bedbug infestation Has been addressed/eradicated  Family Communication: No family present Disposition: Awaiting placement in memory care unit   Objective: Blood pressure 115/87, pulse 87, temperature 98.1 F (36.7 C), temperature source Oral, resp. rate 18, height 5\' 2"  (1.575 m), weight 64.9 kg, SpO2 100%.  Intake/Output Summary (Last 24 hours) at 09/02/2023 1015 Last data filed at 09/02/2023 0900 Gross per 24 hour  Intake 600 ml  Output --  Net 600 ml   Filed Weights   08/13/23 1723  Weight: 64.9 kg    Examination: Patient observed ambulating freely in the hallway.  There is no evidence of respiratory distress.  Respirations are calm and unlabored.  She ambulates without difficulty with a stable gait.  No focal neurologic deficits are appreciable.  Cranial nerves II through XII are intact bilaterally.  CBC: Recent Labs  Lab 08/30/23 1508  WBC 6.2  HGB 12.4  HCT 35.8*  MCV 77.0*  PLT 235   Basic Metabolic Panel: Recent Labs  Lab 08/30/23 1508  NA 136  K 4.2  CL 96*  CO2 26  GLUCOSE 160*  BUN 29*  CREATININE 1.08*  CALCIUM 9.4  MG 2.3  PHOS 3.2   GFR: Estimated Creatinine Clearance: 45.3 mL/min (A) (by C-G formula based on SCr of 1.08 mg/dL (H)).   Scheduled Meds:  amLODipine  10 mg Oral Daily   cholecalciferol  2,000 Units Oral Daily   vitamin B-12  1,000 mcg Oral Daily   feeding supplement  237 mL Oral BID  BM   metFORMIN  500 mg Oral BID WC   OLANZapine  2.5 mg Oral q AM   And   OLANZapine  5 mg Oral QHS      LOS: 21 days   Lonia Blood, MD [facility] Office  [phone] Pager - Text Page per Loretha Stapler  If 7PM-7AM, please contact night-coverage per Amion 09/02/2023, 10:15 AM

## 2023-09-02 NOTE — Progress Notes (Signed)
Patient wondered off the floor walked over to 6 east and brought back. Patient is now laying in bed but still complaining of pain after administering pain medication. Haldol given and is ineffective.

## 2023-09-03 DIAGNOSIS — F0390 Unspecified dementia without behavioral disturbance: Secondary | ICD-10-CM | POA: Diagnosis not present

## 2023-09-03 NOTE — Plan of Care (Signed)
  Problem: Activity: Goal: Risk for activity intolerance will decrease Outcome: Progressing   Problem: Skin Integrity: Goal: Risk for impaired skin integrity will decrease Outcome: Progressing   

## 2023-09-03 NOTE — Progress Notes (Signed)
 Shannon Barrett  ZOX:[id number] DOB: 12/11/1955 DOA: 08/12/2023 PCP: Randell Patient [facility]    Brief Narrative:  68 year old with a history of DM2 and HTN who was brought to the ER 08/12/2023 with confusion and visual/auditory hallucinations.  She was found to have bedbugs with multiple bites and was in an unkempt state.  Admission laboratories were unrevealing but UDS was positive for benzodiazepines and UA was equivocal.  CT head was unrevealing.  Over the course of her prolonged hospital stay she has continued to experience episodes of confusion and agitation.  The patient's family reported that they had noted a significant cognitive decline dating back at least 2 months.  EEG was negative for seizure activity.  MRI of the brain was without significant acute findings.  CT chest abdomen and pelvis did not reveal any acute pathology.  An LP was attempted 1/29 but proved to be unsuccessful.  A fluoroscopically guided LP was able to be completed on 1/30 and there was no evidence consistent with a meningitis.  Goals of Care:   Code Status: Full Code   DVT prophylaxis: SCDs Start: 08/13/23 0543   Interim Hx: Vital signs stable.  Afebrile.  Resting comfortably in her room eating lunch.  No new complaints.  Alert and conversant but confused/demented.  Assessment & Plan:  Severe dementia Family reports cognitive decline dating back at least a year with rapid worsening over the last 2 months -extensive workup with no evidence of a reversible etiology -not presently agitated with consistent use of Zyprexa -stable for placement in memory care unit  Pyuria - resolved  Given clinical situation patient was treated with an empiric course of antibiotic but this had no significant impact on her status  AKI - resolved  Felt to have been most consistent with prerenal state due to dehydration/poor intake  DM2 A1c 6.2 despite recent noncompliance with medications  HTN Reasonably controlled  at present  Microcytic anemia Likely nutritional in etiology given malnourished state as well as B12 deficiency -continue B12 supplementation  Bedbug infestation - resolved Has been addressed/eradicated  Family Communication: No family present Disposition: Awaiting placement in memory care unit   Objective: Blood pressure 115/87, pulse 87, temperature 98.1 F (36.7 C), temperature source Oral, resp. rate 18, height 5\' 2"  (1.575 m), weight 64.9 kg, SpO2 100%.  Intake/Output Summary (Last 24 hours) at 09/03/2023 1115 Last data filed at 09/02/2023 1300 Gross per 24 hour  Intake 240 ml  Output --  Net 240 ml   Filed Weights   08/13/23 1723  Weight: 64.9 kg    Examination: General: No acute respiratory distress Lungs: Clear to auscultation bilaterally without wheezes or crackles Cardiovascular: Regular rate and rhythm without murmur gallop or rub normal S1 and S2 Abdomen: Nontender, nondistended, soft, bowel sounds positive, no rebound, no ascites, no appreciable mass Extremities: No significant cyanosis, clubbing, or edema bilateral lower extremities   CBC: Recent Labs  Lab 08/30/23 1508  WBC 6.2  HGB 12.4  HCT 35.8*  MCV 77.0*  PLT 235   Basic Metabolic Panel: Recent Labs  Lab 08/30/23 1508  NA 136  K 4.2  CL 96*  CO2 26  GLUCOSE 160*  BUN 29*  CREATININE 1.08*  CALCIUM 9.4  MG 2.3  PHOS 3.2   GFR: Estimated Creatinine Clearance: 45.3 mL/min (A) (by C-G formula based on SCr of 1.08 mg/dL (H)).   Scheduled Meds:  cholecalciferol  2,000 Units Oral Daily   vitamin B-12  1,000 mcg Oral Daily  feeding supplement  237 mL Oral BID BM   haloperidol lactate  1 mg Intramuscular Once   metFORMIN  500 mg Oral BID WC   OLANZapine  5 mg Oral q AM   And   OLANZapine  10 mg Oral QHS      LOS: 22 days   Lonia Blood, MD [facility] Office  [phone] Pager - Text Page per Loretha Stapler  If 7PM-7AM, please contact night-coverage per  Amion 09/03/2023, 11:15 AM

## 2023-09-04 DIAGNOSIS — N3000 Acute cystitis without hematuria: Secondary | ICD-10-CM | POA: Diagnosis not present

## 2023-09-04 LAB — GLUCOSE, CAPILLARY
Glucose-Capillary: 97 mg/dL (ref 70–99)
Glucose-Capillary: 97 mg/dL (ref 70–99)

## 2023-09-04 NOTE — Plan of Care (Signed)
  Problem: Health Behavior/Discharge Planning: Goal: Ability to manage health-related needs will improve Outcome: Progressing   Problem: Clinical Measurements: Goal: Ability to maintain clinical measurements within normal limits will improve Outcome: Progressing Goal: Will remain free from infection Outcome: Progressing Goal: Diagnostic test results will improve Outcome: Progressing   Problem: Activity: Goal: Risk for activity intolerance will decrease Outcome: Progressing   Problem: Skin Integrity: Goal: Risk for impaired skin integrity will decrease Outcome: Progressing   Problem: Safety: Goal: Non-violent Restraint(s) Outcome: Progressing   Problem: Education: Goal: Ability to describe self-care measures that may prevent or decrease complications (Diabetes Survival Skills Education) will improve Outcome: Progressing Goal: Individualized Educational Video(s) Outcome: Progressing   Problem: Coping: Goal: Ability to adjust to condition or change in health will improve Outcome: Progressing   Problem: Fluid Volume: Goal: Ability to maintain a balanced intake and output will improve Outcome: Progressing   Problem: Health Behavior/Discharge Planning: Goal: Ability to identify and utilize available resources and services will improve Outcome: Progressing Goal: Ability to manage health-related needs will improve Outcome: Progressing   Problem: Metabolic: Goal: Ability to maintain appropriate glucose levels will improve Outcome: Progressing   Problem: Nutritional: Goal: Maintenance of adequate nutrition will improve Outcome: Progressing Goal: Progress toward achieving an optimal weight will improve Outcome: Progressing   Problem: Skin Integrity: Goal: Risk for impaired skin integrity will decrease Outcome: Progressing   Problem: Tissue Perfusion: Goal: Adequacy of tissue perfusion will improve Outcome: Progressing

## 2023-09-04 NOTE — Progress Notes (Signed)
 Shannon Barrett  ZYS:[id number] DOB: 12/28/1955 DOA: 08/12/2023 PCP: Randell Patient [facility]    Brief Narrative:  68 year old with a history of DM2 and HTN who was brought to the ER 08/12/2023 with confusion and visual/auditory hallucinations.  She was found to have bedbugs with multiple bites and was in an unkempt state.  Admission laboratories were unrevealing but UDS was positive for benzodiazepines and UA was equivocal.  CT head was unrevealing.  Over the course of her prolonged hospital stay she has continued to experience episodes of confusion and agitation.  The patient's family reported that they had noted a significant cognitive decline dating back at least 2 months.  EEG was negative for seizure activity.  MRI of the brain was without significant acute findings.  CT chest abdomen and pelvis did not reveal any acute pathology.  An LP was attempted 1/29 but proved to be unsuccessful.  A fluoroscopically guided LP was able to be completed on 1/30 and there was no evidence consistent with a meningitis.  Goals of Care:   Code Status: Full Code   DVT prophylaxis: SCDs Start: 08/13/23 0543   Interim Hx: Afebrile.  Vital signs stable.  Eating lunch without difficulty.  In good spirits.  Calm and interactive.  Jovial.  Assessment & Plan:  Severe dementia Family reports cognitive decline dating back at least a year with rapid worsening over the last 2 months -extensive workup with no evidence of a reversible etiology -not presently agitated with consistent use of Zyprexa -stable for placement in memory care unit  Pyuria - resolved  Given clinical situation patient was treated with an empiric course of antibiotic but this had no significant impact on her status  AKI - resolved  Felt to have been most consistent with prerenal state due to dehydration/poor intake  DM2 A1c 6.2 despite recent noncompliance with medications -CBG controlled  HTN Blood pressure  controlled  Microcytic anemia Likely nutritional in etiology given malnourished state as well as B12 deficiency -continue B12 supplementation  Bedbug infestation - resolved Has been addressed/eradicated  Family Communication: No family present Disposition: Awaiting placement in memory care unit -medically stable for discharge   Objective: Blood pressure 135/68, pulse 86, temperature 98.5 F (36.9 C), temperature source Oral, resp. rate 20, height 5\' 2"  (1.575 m), weight 64.9 kg, SpO2 99%.  Intake/Output Summary (Last 24 hours) at 09/04/2023 1013 Last data filed at 09/04/2023 0100 Gross per 24 hour  Intake 120 ml  Output 1 ml  Net 119 ml   Filed Weights   08/13/23 1723  Weight: 64.9 kg    Examination: General: No acute respiratory distress Lungs: Clear to auscultation bilaterally  Cardiovascular: RRR without murmur Abdomen: NT/ND, soft, BS positive Extremities: No significant edema B LE   CBC: Recent Labs  Lab 08/30/23 1508  WBC 6.2  HGB 12.4  HCT 35.8*  MCV 77.0*  PLT 235   Basic Metabolic Panel: Recent Labs  Lab 08/30/23 1508  NA 136  K 4.2  CL 96*  CO2 26  GLUCOSE 160*  BUN 29*  CREATININE 1.08*  CALCIUM 9.4  MG 2.3  PHOS 3.2   GFR: Estimated Creatinine Clearance: 45.3 mL/min (A) (by C-G formula based on SCr of 1.08 mg/dL (H)).   Scheduled Meds:  cholecalciferol  2,000 Units Oral Daily   vitamin B-12  1,000 mcg Oral Daily   feeding supplement  237 mL Oral BID BM   metFORMIN  500 mg Oral BID WC   OLANZapine  5 mg Oral q AM   And   OLANZapine  10 mg Oral QHS      LOS: 23 days   Lonia Blood, MD [facility] Office  [phone] Pager - Text Page per Amion  If 7PM-7AM, please contact night-coverage per Amion 09/04/2023, 10:13 AM

## 2023-09-05 DIAGNOSIS — N3000 Acute cystitis without hematuria: Secondary | ICD-10-CM | POA: Diagnosis not present

## 2023-09-05 LAB — QUANTIFERON-TB GOLD PLUS: QuantiFERON-TB Gold Plus: NEGATIVE

## 2023-09-05 LAB — QUANTIFERON-TB GOLD PLUS (RQFGPL)
QuantiFERON Mitogen Value: >10 [IU]/mL
QuantiFERON Nil Value: 0.09 [IU]/mL
QuantiFERON TB1 Ag Value: 0.09 [IU]/mL
QuantiFERON TB2 Ag Value: 0.13 [IU]/mL

## 2023-09-05 MED ORDER — OLANZAPINE 5 MG PO TABS
7.5000 mg | ORAL_TABLET | Freq: Every morning | ORAL | Status: DC
  Administered 2023-09-06 – 2023-09-10 (×5): 7.5 mg via ORAL
  Filled 2023-09-05 (×8): qty 2

## 2023-09-05 MED ORDER — OLANZAPINE 5 MG PO TABS
15.0000 mg | ORAL_TABLET | Freq: Every day | ORAL | Status: DC
  Administered 2023-09-05 – 2023-09-12 (×5): 15 mg via ORAL
  Filled 2023-09-05 (×7): qty 3

## 2023-09-05 MED ORDER — HALOPERIDOL LACTATE 5 MG/ML IJ SOLN
5.0000 mg | Freq: Once | INTRAMUSCULAR | Status: AC
  Administered 2023-09-05: 5 mg via INTRAMUSCULAR
  Filled 2023-09-05: qty 1

## 2023-09-05 MED ORDER — HYDROXYZINE HCL 25 MG PO TABS
25.0000 mg | ORAL_TABLET | Freq: Three times a day (TID) | ORAL | Status: DC | PRN
  Administered 2023-09-10: 25 mg via ORAL
  Filled 2023-09-05 (×2): qty 1

## 2023-09-05 NOTE — Progress Notes (Signed)
 Shannon Barrett  ZOX:[id number] DOB: 1955-10-26 DOA: 08/12/2023 PCP: Randell Patient [facility]    Brief Narrative:  68 year old with a history of DM2 and HTN who was brought to the ER 08/12/2023 with confusion and visual/auditory hallucinations.  She was found to have bedbugs with multiple bites and was in an unkempt state.  Admission laboratories were unrevealing but UDS was positive for benzodiazepines and UA was equivocal.  CT head was unrevealing.  Over the course of her prolonged hospital stay she has continued to experience episodes of confusion and agitation.  The patient's family reported that they had noted a significant cognitive decline dating back at least 2 months.  EEG was negative for seizure activity.  MRI of the brain was without significant acute findings.  CT chest abdomen and pelvis did not reveal any acute pathology.  An LP was attempted 1/29 but proved to be unsuccessful.  A fluoroscopically guided LP was able to be completed on 1/30 and there was no evidence consistent with a meningitis.  Goals of Care:   Code Status: Full Code   DVT prophylaxis: SCDs Start: 08/13/23 0543   Interim Hx: No acute events reported overnight.  Afebrile.  Vital signs stable.  Up ambulating on the unit at the time of visit.  No complaints.  Is calm and able to be redirected.  QuantiFERON gold has come back negative.  Assessment & Plan:  Severe dementia Family reports cognitive decline dating back at least a year with rapid worsening over the last 2 months - extensive workup with no evidence of a reversible etiology - not presently agitated with consistent use of Zyprexa - stable for placement in memory care unit (waiting on results of quantaferron gold)  Pyuria - resolved  Given clinical situation patient was treated with an empiric course of antibiotic but this had no significant impact on her status  AKI - resolved  Felt to have been most consistent with prerenal state due to  dehydration/poor intake  DM2 A1c 6.2 despite recent noncompliance with medications - CBG controlled  HTN Blood pressure reasonably controlled controlled  Microcytic anemia Likely nutritional in etiology given malnourished state as well as B12 deficiency -continue B12 supplementation  Bedbug infestation - resolved Has been addressed/eradicated  Family Communication: No family present Disposition: Awaiting placement in memory care unit -medically stable for discharge   Objective: Blood pressure (!) 139/58, pulse 83, temperature 98.4 F (36.9 C), temperature source Oral, resp. rate 16, height 5\' 2"  (1.575 m), weight 64.9 kg, SpO2 99%. No intake or output data in the 24 hours ending 09/05/23 1023  Filed Weights   08/13/23 1723  Weight: 64.9 kg    Examination: General: No acute respiratory distress Lungs: Clear to auscultation bilaterally  Cardiovascular: RRR without murmur Abdomen: NT/ND, soft, BS positive Extremities: No significant edema B LE   CBC: Recent Labs  Lab 08/30/23 1508  WBC 6.2  HGB 12.4  HCT 35.8*  MCV 77.0*  PLT 235   Basic Metabolic Panel: Recent Labs  Lab 08/30/23 1508  NA 136  K 4.2  CL 96*  CO2 26  GLUCOSE 160*  BUN 29*  CREATININE 1.08*  CALCIUM 9.4  MG 2.3  PHOS 3.2   GFR: Estimated Creatinine Clearance: 45.3 mL/min (A) (by C-G formula based on SCr of 1.08 mg/dL (H)).   Scheduled Meds:  cholecalciferol  2,000 Units Oral Daily   vitamin B-12  1,000 mcg Oral Daily   feeding supplement  237 mL Oral BID BM  metFORMIN  500 mg Oral BID WC   OLANZapine  5 mg Oral q AM   And   OLANZapine  10 mg Oral QHS      LOS: 24 days   Lonia Blood, MD [facility] Office  [phone] Pager - Text Page per Loretha Stapler  If 7PM-7AM, please contact night-coverage per Amion 09/05/2023, 10:23 AM

## 2023-09-05 NOTE — Progress Notes (Signed)
 Mobility Specialist Progress Note:   09/05/23 1052  Mobility  Activity Ambulated independently in hallway  Level of Assistance Independent  Assistive Device None  Distance Ambulated (ft) 550 ft  Activity Response Tolerated well  Mobility Referral Yes  Mobility visit 1 Mobility  Mobility Specialist Start Time (ACUTE ONLY) 0925  Mobility Specialist Stop Time (ACUTE ONLY) 0932  Mobility Specialist Time Calculation (min) (ACUTE ONLY) 7 min   Pt received ambulating in hallway with RN. RN agreeable for MS to take over. Pt assisted back to room after ambulating around unit. Pt left in room with all needs met.    Leory Plowman  Mobility Specialist Please contact via [employer] office at [phone]

## 2023-09-05 NOTE — Plan of Care (Signed)
  Problem: Health Behavior/Discharge Planning: Goal: Ability to manage health-related needs will improve Outcome: Progressing   Problem: Clinical Measurements: Goal: Ability to maintain clinical measurements within normal limits will improve Outcome: Progressing Goal: Will remain free from infection Outcome: Progressing Goal: Diagnostic test results will improve Outcome: Progressing   Problem: Activity: Goal: Risk for activity intolerance will decrease Outcome: Progressing   Problem: Skin Integrity: Goal: Risk for impaired skin integrity will decrease Outcome: Progressing   Problem: Safety: Goal: Non-violent Restraint(s) Outcome: Progressing   Problem: Education: Goal: Ability to describe self-care measures that may prevent or decrease complications (Diabetes Survival Skills Education) will improve Outcome: Progressing Goal: Individualized Educational Video(s) Outcome: Progressing   Problem: Coping: Goal: Ability to adjust to condition or change in health will improve Outcome: Progressing   Problem: Fluid Volume: Goal: Ability to maintain a balanced intake and output will improve Outcome: Progressing   Problem: Health Behavior/Discharge Planning: Goal: Ability to identify and utilize available resources and services will improve Outcome: Progressing Goal: Ability to manage health-related needs will improve Outcome: Progressing   Problem: Metabolic: Goal: Ability to maintain appropriate glucose levels will improve Outcome: Progressing   Problem: Nutritional: Goal: Maintenance of adequate nutrition will improve Outcome: Progressing Goal: Progress toward achieving an optimal weight will improve Outcome: Progressing   Problem: Skin Integrity: Goal: Risk for impaired skin integrity will decrease Outcome: Progressing   Problem: Tissue Perfusion: Goal: Adequacy of tissue perfusion will improve Outcome: Progressing

## 2023-09-06 DIAGNOSIS — F0390 Unspecified dementia without behavioral disturbance: Secondary | ICD-10-CM | POA: Diagnosis not present

## 2023-09-06 LAB — CBC
HCT: 32.6 % — ABNORMAL LOW (ref 36.0–46.0)
Hemoglobin: 11.3 g/dL — ABNORMAL LOW (ref 12.0–15.0)
MCH: 26.6 pg (ref 26.0–34.0)
MCHC: 34.7 g/dL (ref 30.0–36.0)
MCV: 76.7 fL — ABNORMAL LOW (ref 80.0–100.0)
Platelets: 210 10*3/uL (ref 150–400)
RBC: 4.25 MIL/uL (ref 3.87–5.11)
RDW: 14.1 % (ref 11.5–15.5)
WBC: 4.8 10*3/uL (ref 4.0–10.5)
nRBC: 0 % (ref 0.0–0.2)

## 2023-09-06 LAB — BASIC METABOLIC PANEL
Anion gap: 11 (ref 5–15)
BUN: 17 mg/dL (ref 8–23)
CO2: 23 mmol/L (ref 22–32)
Calcium: 9.2 mg/dL (ref 8.9–10.3)
Chloride: 106 mmol/L (ref 98–111)
Creatinine, Ser: 0.83 mg/dL (ref 0.44–1.00)
GFR, Estimated: >60 mL/min (ref >60)
Glucose, Bld: 101 mg/dL — ABNORMAL HIGH (ref 70–99)
Potassium: 4.2 mmol/L (ref 3.5–5.1)
Sodium: 140 mmol/L (ref 135–145)

## 2023-09-06 MED ORDER — METOPROLOL TARTRATE 12.5 MG HALF TABLET
12.5000 mg | ORAL_TABLET | Freq: Two times a day (BID) | ORAL | Status: DC
  Administered 2023-09-06 – 2023-09-12 (×7): 12.5 mg via ORAL
  Filled 2023-09-06 (×13): qty 1

## 2023-09-06 NOTE — Progress Notes (Signed)
 Patient has been up walking frequently, SCD's have been discontinued because of fall risk.

## 2023-09-06 NOTE — Plan of Care (Signed)
  Problem: Activity: Goal: Risk for activity intolerance will decrease Outcome: Progressing   Problem: Skin Integrity: Goal: Risk for impaired skin integrity will decrease Outcome: Progressing   

## 2023-09-06 NOTE — Progress Notes (Signed)
 Shannon Barrett  [id number] DOB: Oct 25, 1955 DOA: 08/12/2023 PCP: Randell Patient [facility]    Brief Narrative:  68 year old with a history of DM2 and HTN who was brought to the ER 08/12/2023 with confusion and visual/auditory hallucinations.  She was found to have bedbugs with multiple bites and was in an unkempt state.  Admission laboratories were unrevealing but UDS was positive for benzodiazepines and UA was equivocal.  CT head was unrevealing.  Over the course of her prolonged hospital stay she has continued to experience episodes of confusion and agitation.  The patient's family reported that they had noted a significant cognitive decline dating back at least 2 months.  EEG was negative for seizure activity.  MRI of the brain was without significant acute findings.  CT chest abdomen and pelvis did not reveal any acute pathology.  An LP was attempted 1/29 but proved to be unsuccessful.  A fluoroscopically guided LP was able to be completed on 1/30 and there was no evidence consistent with a meningitis.  Goals of Care:   Code Status: Full Code   DVT prophylaxis: SCDs Start: 08/13/23 0543 -ambulates frequently in room and down the hall   Interim Hx: Afebrile.  Vital stable.  Blood pressure elevated with systolic 162.  Ambulating around room and unit without any difficulty.  In good spirits today.  Assessment & Plan:  Severe dementia Family reports cognitive decline dating back at least a year with rapid worsening over the last 2 months - extensive workup with no evidence of a reversible etiology - not presently agitated with consistent use of Zyprexa - stable for placement in memory care unit   Pyuria - resolved  Given clinical situation patient was treated with an empiric course of antibiotic but this had no significant impact on her status  AKI - resolved  Felt to have been most consistent with prerenal state due to dehydration/poor intake  DM2 A1c 6.2 despite recent  noncompliance with medications - CBG controlled  HTN Blood pressure elevated today -adjust treatment regimen  Microcytic anemia Likely nutritional in etiology given malnourished state as well as B12 deficiency -continue B12 supplementation  Bedbug infestation - resolved Has been addressed/eradicated  Family Communication: No family present Disposition: Awaiting placement in memory care unit -medically stable for discharge   Objective: Blood pressure (!) 162/90, pulse 77, temperature 98.3 F (36.8 C), resp. rate 18, height 5\' 2"  (1.575 m), weight 64.9 kg, SpO2 100%.  Intake/Output Summary (Last 24 hours) at 09/06/2023 1001 Last data filed at 09/06/2023 0900 Gross per 24 hour  Intake 240 ml  Output --  Net 240 ml    Filed Weights   08/13/23 1723  Weight: 64.9 kg    Examination: General: No acute respiratory distress Lungs: Clear to auscultation bilaterally  Cardiovascular: RRR without murmur Abdomen: NT/ND, soft, BS positive Extremities: No edema B LE   CBC: Recent Labs  Lab 08/30/23 1508 09/06/23 0656  WBC 6.2 4.8  HGB 12.4 11.3*  HCT 35.8* 32.6*  MCV 77.0* 76.7*  PLT 235 210   Basic Metabolic Panel: Recent Labs  Lab 08/30/23 1508 09/06/23 0656  NA 136 140  K 4.2 4.2  CL 96* 106  CO2 26 23  GLUCOSE 160* 101*  BUN 29* 17  CREATININE 1.08* 0.83  CALCIUM 9.4 9.2  MG 2.3  --   PHOS 3.2  --    GFR: Estimated Creatinine Clearance: 58.9 mL/min (by C-G formula based on SCr of 0.83 mg/dL).   Scheduled  Meds:  cholecalciferol  2,000 Units Oral Daily   vitamin B-12  1,000 mcg Oral Daily   feeding supplement  237 mL Oral BID BM   metFORMIN  500 mg Oral BID WC   OLANZapine  7.5 mg Oral q AM   And   OLANZapine  15 mg Oral QHS      LOS: 25 days   Lonia Blood, MD [facility] Office  [phone] Pager - Text Page per Loretha Stapler  If 7PM-7AM, please contact night-coverage per Amion 09/06/2023, 10:01 AM

## 2023-09-06 NOTE — TOC Progression Note (Addendum)
 Transition of Care [facility]) - Progression Note    Patient Details  Name: Shannon Barrett MRN: [id number] Date of Birth: [birth year]  Transition [facility] CM/SW Contact  Carley Hammed, LCSW Phone Number: 09/06/2023, 10:47 AM  Clinical Narrative:    CSW noted family was to tour [facility] and provide Medicaid information to facility. CSW left VM for DeeDee with Zeb Comfort to discuss next steps and any additional needs. CSW left VM with Niece Gabriel Rung, to follow up on tour yesterday. TOC will continue to follow.   1:00 CSW spoke with Gabriel Rung who stated someone from [facility] followed up and stated they were checking with DSS now to get pt approved. CSW spoke with a facility member who stated they would ask director to reach out to CSW. TOC will continue to follow.  Expected Discharge Plan: Assisted Living Barriers to Discharge: Continued Medical Work up, English as a second language teacher, Inadequate or no insurance, Unsafe home situation, Other (must enter comment) (MC/ALF placement)  Expected Discharge Plan and Services In-house Referral: Clinical Social Work   Post Acute Care Choice:  (ALF/ MC) Living arrangements for the past 2 months: Apartment                                       Social Determinants of Health (SDOH) Interventions SDOH Screenings   Food Insecurity: Patient Unable To Answer (08/13/2023)  Housing: Patient Unable To Answer (08/13/2023)  Transportation Needs: Patient Unable To Answer (08/13/2023)  Utilities: Patient Unable To Answer (08/13/2023)  Social Connections: Unknown (08/13/2023)  Tobacco Use: Low Risk  (08/12/2023)    Readmission Risk Interventions     No data to display

## 2023-09-07 DIAGNOSIS — N3000 Acute cystitis without hematuria: Secondary | ICD-10-CM | POA: Diagnosis not present

## 2023-09-07 NOTE — Plan of Care (Signed)
  Problem: Activity: Goal: Risk for activity intolerance will decrease Outcome: Progressing   Problem: Nutritional: Goal: Maintenance of adequate nutrition will improve Outcome: Progressing   Problem: Skin Integrity: Goal: Risk for impaired skin integrity will decrease Outcome: Progressing

## 2023-09-07 NOTE — Plan of Care (Signed)
  Problem: Health Behavior/Discharge Planning: Goal: Ability to manage health-related needs will improve Outcome: Progressing   Problem: Clinical Measurements: Goal: Ability to maintain clinical measurements within normal limits will improve Outcome: Progressing Goal: Will remain free from infection Outcome: Progressing Goal: Diagnostic test results will improve Outcome: Progressing   Problem: Activity: Goal: Risk for activity intolerance will decrease Outcome: Progressing   Problem: Skin Integrity: Goal: Risk for impaired skin integrity will decrease Outcome: Progressing   Problem: Education: Goal: Ability to describe self-care measures that may prevent or decrease complications (Diabetes Survival Skills Education) will improve Outcome: Progressing Goal: Individualized Educational Video(s) Outcome: Progressing   Problem: Coping: Goal: Ability to adjust to condition or change in health will improve Outcome: Progressing   Problem: Fluid Volume: Goal: Ability to maintain a balanced intake and output will improve Outcome: Progressing   Problem: Health Behavior/Discharge Planning: Goal: Ability to identify and utilize available resources and services will improve Outcome: Progressing Goal: Ability to manage health-related needs will improve Outcome: Progressing   Problem: Metabolic: Goal: Ability to maintain appropriate glucose levels will improve Outcome: Progressing   Problem: Nutritional: Goal: Maintenance of adequate nutrition will improve Outcome: Progressing Goal: Progress toward achieving an optimal weight will improve Outcome: Progressing   Problem: Skin Integrity: Goal: Risk for impaired skin integrity will decrease Outcome: Progressing   Problem: Tissue Perfusion: Goal: Adequacy of tissue perfusion will improve Outcome: Progressing

## 2023-09-07 NOTE — Progress Notes (Signed)
 PROGRESS NOTE  Shannon Barrett ZHY:[id number] DOB: [birth year] DOA: 08/12/2023 PCP: Health, [facility]   LOS: 26 days   Brief narrative:  68 year old female with past medical history of diabetes mellitus type 2, hypertension presented to hospital on 08/12/2023 with complaints of confusion, visual and auditory hallucinations.  She was found to have bedbugs with multiple bites and was in an unkempt state.  Admission laboratories were unrevealing but UDS was positive for benzodiazepines and UA was equivocal.  CT head was unrevealing.Over the course of her prolonged hospital stay she has continued to experience episodes of confusion and agitation.  The patient's family reported that they had noted a significant cognitive decline dating back at least 2 months.  EEG was negative for seizure activity.  MRI of the brain was without significant acute findings.  CT chest abdomen and pelvis did not reveal any acute pathology.  A fluoroscopically guided LP was able to be completed on 1/30 and there was no evidence consistent with a meningitis.  At this time patient is awaiting for disposition to memory care unit.     Assessment/Plan: Principal Problem:   UTI (urinary tract infection) Active Problems:   Acute metabolic encephalopathy   Microcytic anemia   Essential hypertension   Type 2 diabetes mellitus (HCC)   Severe dementia Family reports cognitive decline dating back at least a year with rapid worsening over the last 2 months - extensive workup with no evidence of a reversible etiology -stable on Zyprexa.  Awaiting for memory care unit.  Pyuria - resolved  Treated course of antibiotic   AKI - resolved  Patient prerenal secondary to volume depletion and poor oral intake.   Diabetes mellitus type 2 Latest A1c 6.2 despite recent noncompliance with medications, relatively controlled at this time.   HTN On metoprolol.  Continue   Microcytic anemia Patient with vitamin B12  deficiency.  Continue vitamin B12   Bedbug infestation - resolved  DVT prophylaxis: ambulatory   Disposition: awaiting for memory care unit.  Medically stable for discharge.  Status is: Inpatient Remains inpatient appropriate because: Awaiting for memory care unit.    Code Status:     Code Status: Full Code  Family Communication: None at bedside  Consultants: Interventional radiology Psychiatry Neurology  Procedures: EEG Lumbar puncture  Anti-infectives:  None currently  Anti-infectives (From admission, onward)    Start     Dose/Rate Route Frequency Ordered Stop   08/13/23 1000  cefTRIAXone (ROCEPHIN) 1 g in sodium chloride 0.9 % 100 mL IVPB  Status:  Discontinued        1 g 200 mL/hr over 30 Minutes Intravenous Every 24 hours 08/13/23 0530 08/15/23 1424   08/12/23 2300  cefTRIAXone (ROCEPHIN) 2 g in sodium chloride 0.9 % 100 mL IVPB        2 g 200 mL/hr over 30 Minutes Intravenous  Once 08/12/23 2245 08/13/23 0018        Subjective: Today, patient was seen and examined at bedside.  Sleepy.  Denies any pain, nausea, vomiting, fever, chills or rigor.  Objective: Vitals:   09/06/23 0835 09/06/23 1053  BP: (!) 162/90 (!) 162/90  Pulse: 77 77  Resp: 18   Temp: 98.3 F (36.8 C)   SpO2: 100%     Intake/Output Summary (Last 24 hours) at 09/07/2023 0954 Last data filed at 09/06/2023 1700 Gross per 24 hour  Intake 480 ml  Output --  Net 480 ml   Filed Weights   08/13/23 1723  Weight: 64.9 kg   Body mass index is 26.17 kg/m.   Physical Exam: GENERAL: Patient is alert awake and Communicative not in obvious distress. HENT: No scleral pallor or icterus. Pupils equally reactive to light. Oral mucosa is moist NECK: is supple, no gross swelling noted. CHEST: Clear to auscultation. No crackles or wheezes.   CVS: S1 and S2 heard, no murmur. Regular rate and rhythm.  ABDOMEN: Soft, non-tender, bowel sounds are present. EXTREMITIES: No edema. CNS: Cranial  nerves are intact. No focal motor deficits. SKIN: warm and dry without rashes.  Data Review: I have personally reviewed the following laboratory data and studies,  CBC: Recent Labs  Lab 09/06/23 0656  WBC 4.8  HGB 11.3*  HCT 32.6*  MCV 76.7*  PLT 210   Basic Metabolic Panel: Recent Labs  Lab 09/06/23 0656  NA 140  K 4.2  CL 106  CO2 23  GLUCOSE 101*  BUN 17  CREATININE 0.83  CALCIUM 9.2   Liver Function Tests: No results for input(s): "AST", "ALT", "ALKPHOS", "BILITOT", "PROT", "ALBUMIN" in the last 168 hours. No results for input(s): "LIPASE", "AMYLASE" in the last 168 hours. No results for input(s): "AMMONIA" in the last 168 hours. Cardiac Enzymes: No results for input(s): "CKTOTAL", "CKMB", "CKMBINDEX", "TROPONINI" in the last 168 hours. BNP (last 3 results) No results for input(s): "BNP" in the last 8760 hours.  ProBNP (last 3 results) No results for input(s): "PROBNP" in the last 8760 hours.  CBG: Recent Labs  Lab 08/31/23 1147 08/31/23 2204 09/04/23 1142 09/04/23 1703  GLUCAP 171* 163* 97 97   No results found for this or any previous visit (from the past 240 hours).   Studies: No results found.    Joycelyn Das, MD  [facility] 09/07/2023  If 7PM-7AM, please contact night-coverage

## 2023-09-07 NOTE — Hospital Course (Signed)
 68 year old female with past medical history of diabetes mellitus type 2, hypertension presented to hospital on 08/12/2023 with complaints of confusion, visual and auditory hallucinations.  She was found to have bedbugs with multiple bites and was in an unkempt state.  Admission laboratories were unrevealing but UDS was positive for benzodiazepines and UA was equivocal.  CT head was unrevealing.Over the course of her prolonged hospital stay she has continued to experience episodes of confusion and agitation.  The patient's family reported that they had noted a significant cognitive decline dating back at least 2 months.  EEG was negative for seizure activity.  MRI of the brain was without significant acute findings.  CT chest abdomen and pelvis did not reveal any acute pathology.  A fluoroscopically guided LP was able to be completed on 1/30 and there was no evidence consistent with a meningitis.  Assessment & Plan:   Severe dementia Family reports cognitive decline dating back at least a year with rapid worsening over the last 2 months - extensive workup with no evidence of a reversible etiology -stable on Zyprexa.  Awaiting for memory care unit.  Pyuria - resolved  Treated course of antibiotic   AKI - resolved  Patient prerenal secondary to volume depletion and poor oral intake.   Diabetes mellitus type 2 Latest A1c 6.2 despite recent noncompliance with medications, relatively controlled at this time.   HTN On metoprolol.  Continue   Microcytic anemia Patient with vitamin B12 deficiency.  Continue vitamin B12   Bedbug infestation - resolved

## 2023-09-08 DIAGNOSIS — N3000 Acute cystitis without hematuria: Secondary | ICD-10-CM | POA: Diagnosis not present

## 2023-09-08 NOTE — Plan of Care (Signed)
  Problem: Health Behavior/Discharge Planning: Goal: Ability to manage health-related needs will improve Outcome: Progressing   Problem: Clinical Measurements: Goal: Ability to maintain clinical measurements within normal limits will improve Outcome: Progressing Goal: Will remain free from infection Outcome: Progressing Goal: Diagnostic test results will improve Outcome: Progressing   Problem: Activity: Goal: Risk for activity intolerance will decrease Outcome: Progressing   Problem: Skin Integrity: Goal: Risk for impaired skin integrity will decrease Outcome: Progressing   Problem: Education: Goal: Ability to describe self-care measures that may prevent or decrease complications (Diabetes Survival Skills Education) will improve Outcome: Progressing Goal: Individualized Educational Video(s) Outcome: Progressing   Problem: Coping: Goal: Ability to adjust to condition or change in health will improve Outcome: Progressing   Problem: Fluid Volume: Goal: Ability to maintain a balanced intake and output will improve Outcome: Progressing   Problem: Health Behavior/Discharge Planning: Goal: Ability to identify and utilize available resources and services will improve Outcome: Progressing Goal: Ability to manage health-related needs will improve Outcome: Progressing   Problem: Metabolic: Goal: Ability to maintain appropriate glucose levels will improve Outcome: Progressing   Problem: Nutritional: Goal: Maintenance of adequate nutrition will improve Outcome: Progressing Goal: Progress toward achieving an optimal weight will improve Outcome: Progressing   Problem: Skin Integrity: Goal: Risk for impaired skin integrity will decrease Outcome: Progressing   Problem: Tissue Perfusion: Goal: Adequacy of tissue perfusion will improve Outcome: Progressing

## 2023-09-08 NOTE — Progress Notes (Signed)
 PROGRESS NOTE  Shannon Barrett:[id number] DOB: 07/17/1956 DOA: 08/12/2023 PCP: Health, [facility]   LOS: 27 days   Brief narrative:  68 year old female with past medical history of diabetes mellitus type 2, hypertension presented to hospital on 08/12/2023 with complaints of confusion, visual and auditory hallucinations.  She was found to have bedbugs with multiple bites and was in an unkempt state.  Admission laboratories were unrevealing but UDS was positive for benzodiazepines and UA was equivocal.  CT head was unrevealing.Over the course of her prolonged hospital stay she has continued to experience episodes of confusion and agitation.  The patient's family reported that they had noted a significant cognitive decline dating back at least 2 months.  EEG was negative for seizure activity.  MRI of the brain was without significant acute findings.  CT chest abdomen and pelvis did not reveal any acute pathology.  A fluoroscopically guided LP was able to be completed on 1/30 and there was no evidence consistent with a meningitis.  At this time patient is awaiting for disposition to memory care unit.     Assessment/Plan: Principal Problem:   UTI (urinary tract infection) Active Problems:   Acute metabolic encephalopathy   Microcytic anemia   Essential hypertension   Type 2 diabetes mellitus (HCC)   Severe dementia Family reports cognitive decline dating back at least a year with rapid worsening over the last 2 months - extensive workup with no evidence of a reversible etiology, stable on Zyprexa.  Awaiting for memory care unit placement.  Pyuria - resolved  Treated course of antibiotic   AKI - resolved  Patient prerenal secondary to volume depletion and poor oral intake.   Diabetes mellitus type 2 Latest A1c 6.2 despite recent noncompliance with medications, relatively controlled at this time. On regular diet.    HTN On metoprolol.  Continue, BP is controlled.     Microcytic anemia Patient with vitamin B12 deficiency.  Continue vitamin B12   Bedbug infestation - resolved  DVT prophylaxis: ambulatory   Disposition: awaiting for memory care unit.  Medically stable for discharge.  Status is: Inpatient Remains inpatient appropriate because: Awaiting for memory care unit.    Code Status:     Code Status: Full Code  Family Communication: None at bedside  Consultants: Interventional radiology Psychiatry Neurology  Procedures: EEG Lumbar puncture  Anti-infectives:  None currently   Subjective: Today, patient was seen and examined at bedside.  Calm and comfortable.  Lying in bed.  Denies any pain nausea vomiting fevers.  Objective: Vitals:   09/08/23 0559 09/08/23 0759  BP: (!) 118/59 118/68  Pulse: 74 70  Resp: 16 17  Temp:  97.9 F (36.6 C)  SpO2: 100% 100%    Intake/Output Summary (Last 24 hours) at 09/08/2023 0932 Last data filed at 09/07/2023 1511 Gross per 24 hour  Intake 120 ml  Output --  Net 120 ml   Filed Weights   08/13/23 1723  Weight: 64.9 kg   Body mass index is 26.17 kg/m.   Physical Exam: GENERAL: Patient is alert awake and Communicative not in obvious distress.   comfortable. HENT: No scleral pallor or icterus. Pupils equally reactive to light. Oral mucosa is moist NECK: is supple, no gross swelling noted. CHEST: Clear to auscultation. No crackles or wheezes.   CVS: S1 and S2 heard, no murmur. Regular rate and rhythm.  ABDOMEN: Soft, non-tender, bowel sounds are present. EXTREMITIES: No edema. CNS: Cranial nerves are intact. No focal motor deficits. SKIN:  warm and dry without rashes.  Data Review: I have personally reviewed the following laboratory data and studies,  CBC: Recent Labs  Lab 09/06/23 0656  WBC 4.8  HGB 11.3*  HCT 32.6*  MCV 76.7*  PLT 210   Basic Metabolic Panel: Recent Labs  Lab 09/06/23 0656  NA 140  K 4.2  CL 106  CO2 23  GLUCOSE 101*  BUN 17  CREATININE 0.83   CALCIUM 9.2   Liver Function Tests: No results for input(s): "AST", "ALT", "ALKPHOS", "BILITOT", "PROT", "ALBUMIN" in the last 168 hours. No results for input(s): "LIPASE", "AMYLASE" in the last 168 hours. No results for input(s): "AMMONIA" in the last 168 hours. Cardiac Enzymes: No results for input(s): "CKTOTAL", "CKMB", "CKMBINDEX", "TROPONINI" in the last 168 hours. BNP (last 3 results) No results for input(s): "BNP" in the last 8760 hours.  ProBNP (last 3 results) No results for input(s): "PROBNP" in the last 8760 hours.  CBG: Recent Labs  Lab 09/04/23 1142 09/04/23 1703  GLUCAP 97 97   No results found for this or any previous visit (from the past 240 hours).   Studies: No results found.    Joycelyn Das, MD  [facility] 09/08/2023  If 7PM-7AM, please contact night-coverage
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
## 2023-09-09 DIAGNOSIS — N3000 Acute cystitis without hematuria: Secondary | ICD-10-CM | POA: Diagnosis not present

## 2023-09-09 MED ORDER — ENOXAPARIN SODIUM 40 MG/0.4ML IJ SOSY
40.0000 mg | PREFILLED_SYRINGE | INTRAMUSCULAR | Status: DC
  Administered 2023-09-09: 40 mg via SUBCUTANEOUS
  Filled 2023-09-09 (×4): qty 0.4

## 2023-09-09 NOTE — Plan of Care (Signed)
 ?  Problem: Clinical Measurements: ?Goal: Will remain free from infection ?Outcome: Progressing ?  ?

## 2023-09-09 NOTE — Progress Notes (Addendum)
 Patient refused her assessment and her  night time medication,will continue to monitor.

## 2023-09-09 NOTE — Care Management Important Message (Signed)
 Important Message  Patient Details  Name: Shannon Barrett MRN: [id number] Date of Birth: 10-29-1955   Important Message Given:  Yes - Medicare IM     Sherilyn Banker 09/09/2023, 3:26 PM

## 2023-09-09 NOTE — Progress Notes (Signed)
 PROGRESS NOTE  CAITLINN KLINKER ZOX:[id number] DOB: August 19, 1955 DOA: 08/12/2023 PCP: Health, [facility]   LOS: 28 days   Brief narrative:  68 year old female with past medical history of diabetes mellitus type 2, hypertension presented to hospital on 08/12/2023 with complaints of confusion, visual and auditory hallucinations.  She was found to have bedbugs with multiple bites and was in an unkempt state.  Admission laboratories were unrevealing but UDS was positive for benzodiazepines and UA was equivocal.  CT head was unrevealing.Over the course of her prolonged hospital stay she has continued to experience episodes of confusion and agitation.  The patient's family reported that they had noted a significant cognitive decline dating back at least 2 months.  EEG was negative for seizure activity.  MRI of the brain was without significant acute findings.  CT chest abdomen and pelvis did not reveal any acute pathology.  A fluoroscopically guided LP was able to be completed on 1/30 and there was no evidence consistent with a meningitis.  At this time patient is awaiting for disposition to memory care unit.     Assessment/Plan: Principal Problem:   UTI (urinary tract infection) Active Problems:   Acute metabolic encephalopathy   Microcytic anemia   Essential hypertension   Type 2 diabetes mellitus (HCC)   Severe dementia Family reports cognitive decline dating back at least a year with rapid worsening over the last 2 months - extensive workup with no evidence of a reversible etiology, stable on Zyprexa.  Awaiting for memory care unit placement.  Pyuria - resolved  Patient has completed course of antibiotic   AKI - resolved  Patient prerenal secondary to volume depletion and poor oral intake.   Diabetes mellitus type 2 Latest A1c 6.2 despite recent noncompliance with medications, relatively controlled at this time. On regular diet.    HTN On metoprolol.  Continue, BP is  controlled.    Microcytic anemia Patient with vitamin B12 deficiency.  Continue vitamin B12   Bedbug infestation - resolved  DVT prophylaxis: enoxaparin (LOVENOX) injection 40 mg Start: 09/09/23 1115ambulatory   Disposition: awaiting for memory care unit.  Medically stable for discharge.  Status is: Inpatient Remains inpatient appropriate because: Awaiting for memory care unit.    Code Status:     Code Status: Full Code  Family Communication: None at bedside  Consultants: Interventional radiology Psychiatry Neurology  Procedures: EEG Lumbar puncture  Anti-infectives:  None currently   Subjective: Today, patient was seen and examined at bedside.  Calm and comfortable.  Sleeping in bed.  Denies any pain, nausea, vomiting, fever, chills or rigor.   Objective: Vitals:   09/08/23 0559 09/08/23 0759  BP: (!) 118/59 118/68  Pulse: 74 70  Resp: 16 17  Temp:  97.9 F (36.6 C)  SpO2: 100% 100%    Intake/Output Summary (Last 24 hours) at 09/09/2023 1023 Last data filed at 09/08/2023 1800 Gross per 24 hour  Intake 300 ml  Output --  Net 300 ml   Filed Weights   08/13/23 1723  Weight: 64.9 kg   Body mass index is 26.17 kg/m.   Physical Exam:  GENERAL: Patient is alert awake and Communicative not in obvious distress.   comfortable.  Sleeping in bed. HENT: No scleral pallor or icterus. Pupils equally reactive to light. Oral mucosa is moist NECK: is supple, no gross swelling noted. CHEST: Clear to auscultation. No crackles or wheezes.   CVS: S1 and S2 heard, no murmur. Regular rate and rhythm.  ABDOMEN: Soft,  non-tender, bowel sounds are present. EXTREMITIES: No edema. CNS: Cranial nerves are intact. No focal motor deficits. SKIN: warm and dry without rashes.  Data Review: I have personally reviewed the following laboratory data and studies,  CBC: Recent Labs  Lab 09/06/23 0656  WBC 4.8  HGB 11.3*  HCT 32.6*  MCV 76.7*  PLT 210   Basic Metabolic  Panel: Recent Labs  Lab 09/06/23 0656  NA 140  K 4.2  CL 106  CO2 23  GLUCOSE 101*  BUN 17  CREATININE 0.83  CALCIUM 9.2   Liver Function Tests: No results for input(s): "AST", "ALT", "ALKPHOS", "BILITOT", "PROT", "ALBUMIN" in the last 168 hours. No results for input(s): "LIPASE", "AMYLASE" in the last 168 hours. No results for input(s): "AMMONIA" in the last 168 hours. Cardiac Enzymes: No results for input(s): "CKTOTAL", "CKMB", "CKMBINDEX", "TROPONINI" in the last 168 hours. BNP (last 3 results) No results for input(s): "BNP" in the last 8760 hours.  ProBNP (last 3 results) No results for input(s): "PROBNP" in the last 8760 hours.  CBG: Recent Labs  Lab 09/04/23 1142 09/04/23 1703  GLUCAP 97 97   No results found for this or any previous visit (from the past 240 hours).   Studies: No results found.    Joycelyn Das, MD  [facility] 09/09/2023  If 7PM-7AM, please contact night-coverage

## 2023-09-09 NOTE — TOC Progression Note (Signed)
 Transition of Care [facility]) - Progression Note    Patient Details  Name: Shannon Barrett MRN: [id number] Date of Birth: 03-May-1956  [facility]) CM/SW Contact  Michaela Corner, [place] Phone Number: 09/09/2023, 1:32 PM  Clinical Narrative:   CSW spoke with DeeDee w/ [facility] for update on pts referral status. DeeDee informed CSW that they can accept pt at [facility] Tuesday 2/25 @11AM . Before discharge DeeDee stated she will need the following by Monday 2/24:   - TB test results (CSW securely emailed DeeDee results) - FL2 needs to reflect pts dementia diagnosis, list of dc meds copied from dc summary, and state Memory Care SCU under discharge plan (CSW updated discharge plan)  - COVID test needs to be done (CSW notified MD and RN)  DeeDee asked CSW what will pts mode of transportation be day of dc. CSW stated [facility] can be arranged if need be.   TOC will continue to follow.   Expected Discharge Plan: Assisted Living Barriers to Discharge: Continued Medical Work up, English as a second language teacher, Inadequate or no insurance, Unsafe home situation, Other (must enter comment) (MC/ALF placement)  Expected Discharge Plan and Services In-house Referral: Clinical Social Work   Post Acute Care Choice:  (ALF/ MC) Living arrangements for the past 2 months: Apartment                                       Social Determinants of Health (SDOH) Interventions SDOH Screenings   Food Insecurity: Patient Unable To Answer (08/13/2023)  Housing: Patient Unable To Answer (08/13/2023)  Transportation Needs: Patient Unable To Answer (08/13/2023)  Utilities: Patient Unable To Answer (08/13/2023)  Social Connections: Unknown (08/13/2023)  Tobacco Use: Low Risk  (08/12/2023)    Readmission Risk Interventions     No data to display

## 2023-09-10 DIAGNOSIS — N3000 Acute cystitis without hematuria: Secondary | ICD-10-CM | POA: Diagnosis not present

## 2023-09-10 MED ORDER — OLANZAPINE 10 MG IM SOLR
10.0000 mg | Freq: Once | INTRAMUSCULAR | Status: AC
  Administered 2023-09-10: 10 mg via INTRAMUSCULAR
  Filled 2023-09-10: qty 10

## 2023-09-10 MED ORDER — HALOPERIDOL LACTATE 5 MG/ML IJ SOLN
2.0000 mg | Freq: Three times a day (TID) | INTRAMUSCULAR | Status: DC | PRN
  Administered 2023-09-10 – 2023-09-12 (×2): 2 mg via INTRAMUSCULAR
  Filled 2023-09-10: qty 0.4
  Filled 2023-09-10: qty 1

## 2023-09-10 MED ORDER — HALOPERIDOL 1 MG PO TABS: 2.0000 mg | ORAL_TABLET | Freq: Three times a day (TID) | ORAL | Status: DC | PRN

## 2023-09-10 MED ORDER — HALOPERIDOL 1 MG PO TABS
2.0000 mg | ORAL_TABLET | Freq: Three times a day (TID) | ORAL | Status: DC | PRN
  Filled 2023-09-10: qty 2

## 2023-09-10 NOTE — Plan of Care (Signed)
  Problem: Health Behavior/Discharge Planning: Goal: Ability to manage health-related needs will improve Outcome: Progressing   Problem: Clinical Measurements: Goal: Ability to maintain clinical measurements within normal limits will improve Outcome: Progressing Goal: Will remain free from infection Outcome: Progressing Goal: Diagnostic test results will improve Outcome: Progressing   Problem: Activity: Goal: Risk for activity intolerance will decrease Outcome: Progressing   Problem: Skin Integrity: Goal: Risk for impaired skin integrity will decrease Outcome: Progressing   Problem: Education: Goal: Ability to describe self-care measures that may prevent or decrease complications (Diabetes Survival Skills Education) will improve Outcome: Progressing Goal: Individualized Educational Video(s) Outcome: Progressing   Problem: Coping: Goal: Ability to adjust to condition or change in health will improve Outcome: Progressing   Problem: Fluid Volume: Goal: Ability to maintain a balanced intake and output will improve Outcome: Progressing   Problem: Health Behavior/Discharge Planning: Goal: Ability to identify and utilize available resources and services will improve Outcome: Progressing Goal: Ability to manage health-related needs will improve Outcome: Progressing   Problem: Metabolic: Goal: Ability to maintain appropriate glucose levels will improve Outcome: Progressing   Problem: Nutritional: Goal: Maintenance of adequate nutrition will improve Outcome: Progressing Goal: Progress toward achieving an optimal weight will improve Outcome: Progressing   Problem: Skin Integrity: Goal: Risk for impaired skin integrity will decrease Outcome: Progressing   Problem: Tissue Perfusion: Goal: Adequacy of tissue perfusion will improve Outcome: Progressing

## 2023-09-10 NOTE — Progress Notes (Signed)
 PROGRESS NOTE  Shannon Barrett ZOX:[id number] DOB: Dec 05, 1955 DOA: 08/12/2023 PCP: Health, [facility]   LOS: 29 days   Brief narrative:  68 year old female with past medical history of diabetes mellitus type 2, hypertension presented to hospital on 08/12/2023 with complaints of confusion, visual and auditory hallucinations.  She was found to have bedbugs with multiple bites and was in an unkempt state.  Admission laboratories were unrevealing but UDS was positive for benzodiazepines and UA was equivocal.  CT head was unrevealing.Over the course of her prolonged hospital stay she has continued to experience episodes of confusion and agitation.  The patient's family reported that they had noted a significant cognitive decline dating back at least 2 months.  EEG was negative for seizure activity.  MRI of the brain was without significant acute findings.  CT chest abdomen and pelvis did not reveal any acute pathology.  A fluoroscopically guided LP was able to be completed on 1/30 and there was no evidence consistent with a meningitis.  At this time patient is awaiting for disposition to memory care unit.     Assessment/Plan: Principal Problem:   UTI (urinary tract infection) Active Problems:   Acute metabolic encephalopathy   Microcytic anemia   Essential hypertension   Type 2 diabetes mellitus (HCC)   Severe dementia Family reports cognitive decline dating back at least a year with rapid worsening over the last 2 months - extensive workup with no evidence of a reversible etiology, stable on Zyprexa.  Awaiting for memory care unit placement.  Keeps walking in the hallway.  Pyuria - resolved  Patient has completed course of antibiotic   AKI - resolved  Patient prerenal secondary to volume depletion and poor oral intake.   Diabetes mellitus type 2 Latest A1c 6.2 despite recent noncompliance with medications, relatively controlled at this time. On regular diet.    HTN On  metoprolol.  Continue, BP is controlled.    Microcytic anemia Patient with vitamin B12 deficiency.  Continue vitamin B12   Bedbug infestation - resolved  DVT prophylaxis: enoxaparin (LOVENOX) injection 40 mg Start: 09/09/23 1115ambulatory   Disposition: awaiting for memory care unit.  Medically stable for discharge.  Status is: Inpatient Remains inpatient appropriate because: Awaiting for memory care unit.    Code Status:     Code Status: Full Code  Family Communication: None at bedside  Consultants: Interventional radiology Psychiatry Neurology  Procedures: EEG Lumbar puncture  Anti-infectives:  None currently   Subjective: Today, patient was seen and examined at bedside.  Walking in the hallway  Denies any pain, nausea, vomiting, fever, chills or rigor.   Objective: Vitals:   09/08/23 0759 09/09/23 1935  BP: 118/68 118/82  Pulse: 70 96  Resp: 17 18  Temp: 97.9 F (36.6 C) 98.3 F (36.8 C)  SpO2: 100% 98%   No intake or output data in the 24 hours ending 09/10/23 0948  Filed Weights   08/13/23 1723  Weight: 64.9 kg   Body mass index is 26.17 kg/m.   Physical Exam:  GENERAL: Patient is alert awake and Communicative not in obvious distress.   comfortable.  Sleeping in bed.  Significant cognitive impairment. HENT: No scleral pallor or icterus. Pupils equally reactive to light. Oral mucosa is moist NECK: is supple, no gross swelling noted. CHEST: Clear to auscultation. No crackles or wheezes.   CVS: S1 and S2 heard, no murmur. Regular rate and rhythm.  ABDOMEN: Soft, non-tender, bowel sounds are present. EXTREMITIES: No edema. CNS: Cranial  nerves are intact. No focal motor deficits. SKIN: warm and dry without rashes.  Data Review: I have personally reviewed the following laboratory data and studies,  CBC: Recent Labs  Lab 09/06/23 0656  WBC 4.8  HGB 11.3*  HCT 32.6*  MCV 76.7*  PLT 210   Basic Metabolic Panel: Recent Labs  Lab  09/06/23 0656  NA 140  K 4.2  CL 106  CO2 23  GLUCOSE 101*  BUN 17  CREATININE 0.83  CALCIUM 9.2   Liver Function Tests: No results for input(s): "AST", "ALT", "ALKPHOS", "BILITOT", "PROT", "ALBUMIN" in the last 168 hours. No results for input(s): "LIPASE", "AMYLASE" in the last 168 hours. No results for input(s): "AMMONIA" in the last 168 hours. Cardiac Enzymes: No results for input(s): "CKTOTAL", "CKMB", "CKMBINDEX", "TROPONINI" in the last 168 hours. BNP (last 3 results) No results for input(s): "BNP" in the last 8760 hours.  ProBNP (last 3 results) No results for input(s): "PROBNP" in the last 8760 hours.  CBG: Recent Labs  Lab 09/04/23 1142 09/04/23 1703  GLUCAP 97 97   No results found for this or any previous visit (from the past 240 hours).   Studies: No results found.    Joycelyn Das, MD  [facility] 09/10/2023  If 7PM-7AM, please contact night-coverage

## 2023-09-11 DIAGNOSIS — N3000 Acute cystitis without hematuria: Secondary | ICD-10-CM | POA: Diagnosis not present

## 2023-09-11 NOTE — Progress Notes (Signed)
 PROGRESS NOTE  Shannon Barrett WUJ:[id number] DOB: 1956/05/10 DOA: 08/12/2023 PCP: Health, [facility] Public   LOS: 30 days   Brief narrative:  68 year old female with past medical history of diabetes mellitus type 2, hypertension presented to hospital on 08/12/2023 with complaints of confusion, visual and auditory hallucinations.  She was found to have bedbugs with multiple bites and was in an unkempt state.  Admission laboratories were unrevealing but UDS was positive for benzodiazepines and UA was equivocal.  CT head was unrevealing.Over the course of her prolonged hospital stay she has continued to experience episodes of confusion and agitation.  The patient's family reported that they had noted a significant cognitive decline dating back at least 2 months.  EEG was negative for seizure activity.  MRI of the brain was without significant acute findings.  CT chest abdomen and pelvis did not reveal any acute pathology.  A fluoroscopically guided LP was able to be completed on 1/30 and there was no evidence consistent with a meningitis.  At this time patient is awaiting for disposition to memory care unit.     Assessment/Plan: Principal Problem:   UTI (urinary tract infection) Active Problems:   Acute metabolic encephalopathy   Microcytic anemia   Essential hypertension   Type 2 diabetes mellitus (HCC)   Severe dementia Family reports cognitive decline dating back at least a year with rapid worsening over the last 2 months - extensive workup with no evidence of a reversible etiology, stable on Zyprexa.  Awaiting for memory care unit placement.  Keeps walking in the hallway.  Pyuria - resolved  Patient has completed course of antibiotic   AKI - resolved  Patient prerenal secondary to volume depletion and poor oral intake.   Diabetes mellitus type 2 Latest A1c 6.2 despite recent noncompliance with medications, relatively controlled at this time. On regular diet.    HTN On  metoprolol.  Continue, BP is controlled.    Microcytic anemia Patient with vitamin B12 deficiency.  Continue vitamin B12   Bedbug infestation - resolved  DVT prophylaxis: enoxaparin (LOVENOX) injection 40 mg Start: 09/09/23 1115ambulatory   Disposition: awaiting for memory care unit.  Medically stable for discharge.  Status is: Inpatient Remains inpatient appropriate because: Awaiting for memory care unit.    Code Status:     Code Status: Full Code  Family Communication: None at bedside  Consultants: Interventional radiology Psychiatry Neurology  Procedures: EEG Lumbar puncture  Anti-infectives:  None currently   Subjective: Today, patient was seen and examined at bedside.  Patient was quite agitated yesterday and was walking in the hallway entering patient's room.  Required Zyprexa IM with increased dose of Haldol yesterday.    Objective: Vitals:   09/10/23 2044 09/11/23 0518  BP: (!) 127/107 (!) 127/58  Pulse: 86 67  Resp: 16 16  Temp: 98 F (36.7 C) 98 F (36.7 C)  SpO2: 100% 100%   No intake or output data in the 24 hours ending 09/11/23 1051  Filed Weights   08/13/23 1723  Weight: 64.9 kg   Body mass index is 26.17 kg/m.   Physical Exam:  GENERAL: Patient is sleeping in bed.   HENT: No scleral pallor or icterus. Pupils equally reactive to light. Oral mucosa is moist NECK: is supple, no gross swelling noted. CHEST: Clear to auscultation. No crackles or wheezes.   CVS: S1 and S2 heard, no murmur. Regular rate and rhythm.  ABDOMEN: Soft, non-tender, bowel sounds are present. EXTREMITIES: No edema. CNS: Cranial nerves  are intact. No focal motor deficits. SKIN: warm and dry without rashes.  Data Review: I have personally reviewed the following laboratory data and studies,  CBC: Recent Labs  Lab 09/06/23 0656  WBC 4.8  HGB 11.3*  HCT 32.6*  MCV 76.7*  PLT 210   Basic Metabolic Panel: Recent Labs  Lab 09/06/23 0656  NA 140  K 4.2   CL 106  CO2 23  GLUCOSE 101*  BUN 17  CREATININE 0.83  CALCIUM 9.2   Liver Function Tests: No results for input(s): "AST", "ALT", "ALKPHOS", "BILITOT", "PROT", "ALBUMIN" in the last 168 hours. No results for input(s): "LIPASE", "AMYLASE" in the last 168 hours. No results for input(s): "AMMONIA" in the last 168 hours. Cardiac Enzymes: No results for input(s): "CKTOTAL", "CKMB", "CKMBINDEX", "TROPONINI" in the last 168 hours. BNP (last 3 results) No results for input(s): "BNP" in the last 8760 hours.  ProBNP (last 3 results) No results for input(s): "PROBNP" in the last 8760 hours.  CBG: Recent Labs  Lab 09/04/23 1142 09/04/23 1703  GLUCAP 97 97   No results found for this or any previous visit (from the past 240 hours).   Studies: No results found.    Joycelyn Das, MD  [facility] 09/11/2023  If 7PM-7AM, please contact night-coverage

## 2023-09-12 DIAGNOSIS — L8990 Pressure ulcer of unspecified site, unspecified stage: Secondary | ICD-10-CM | POA: Diagnosis present

## 2023-09-12 DIAGNOSIS — N3000 Acute cystitis without hematuria: Secondary | ICD-10-CM | POA: Diagnosis not present

## 2023-09-12 DIAGNOSIS — F03C0 Unspecified dementia, severe, without behavioral disturbance, psychotic disturbance, mood disturbance, and anxiety: Secondary | ICD-10-CM | POA: Insufficient documentation

## 2023-09-12 MED ORDER — METOPROLOL TARTRATE 25 MG PO TABS: 12.5000 mg | ORAL_TABLET | Freq: Two times a day (BID) | ORAL | Status: AC

## 2023-09-12 MED ORDER — HALOPERIDOL 2 MG PO TABS: 2.0000 mg | ORAL_TABLET | Freq: Three times a day (TID) | ORAL | Status: AC | PRN

## 2023-09-12 MED ORDER — OLANZAPINE 7.5 MG PO TABS: ORAL_TABLET | ORAL | Status: AC

## 2023-09-12 MED ORDER — HYDROXYZINE HCL 25 MG PO TABS: 25.0000 mg | ORAL_TABLET | Freq: Three times a day (TID) | ORAL | Status: AC | PRN

## 2023-09-12 NOTE — TOC Progression Note (Signed)
 Transition of Care [facility]) - Progression Note    Patient Details  Name: Shannon Barrett MRN: [id number] Date of Birth: 1956-07-02  Transition of Care [facility]) CM/SW Contact  Carley Hammed, LCSW Phone Number: 09/12/2023, 12:32 PM  Clinical Narrative:    CSW spoke with [facility] and confirmed they are able to take pt tomorrow. CSW sent over DC summary and updated FL2 in preparation. CSW spoke with Gabriel Rung, who notes she will sign paperwork around 41 and then will plan to transport pt. She will let CSW know when she will be able to pick pt up tomorrow. TOC will continue to follow.    Expected Discharge Plan: Assisted Living Barriers to Discharge: Continued Medical Work up, English as a second language teacher, Inadequate or no insurance, Unsafe home situation, Other (must enter comment) (MC/ALF placement)  Expected Discharge Plan and Services In-house Referral: Clinical Social Work   Post Acute Care Choice:  (ALF/ [facility]) Living arrangements for the past 2 months: Apartment Expected Discharge Date: 09/12/23                                     Social Determinants of Health (SDOH) Interventions SDOH Screenings   Food Insecurity: Patient Unable To Answer (08/13/2023)  Housing: Patient Unable To Answer (08/13/2023)  Transportation Needs: Patient Unable To Answer (08/13/2023)  Utilities: Patient Unable To Answer (08/13/2023)  Social Connections: Unknown (08/13/2023)  Tobacco Use: Low Risk  (08/12/2023)    Readmission Risk Interventions     No data to display

## 2023-09-12 NOTE — Discharge Summary (Addendum)
 Physician Discharge Summary  Shannon Barrett UEA:[id number] DOB: 1955-12-17 DOA: 08/12/2023  PCP: [facility] Patient [facility]  Admit date: 08/12/2023 Discharge date: 09/13/2023  Admitted From: Home  Discharge disposition: SNF   Recommendations for Outpatient Follow-Up:   Follow up with your primary care provide at the skilled nursing facility in 3 to 5 days. Check CBC, BMP, magnesium in the next visit   Discharge Diagnosis:   Principal Problem:   UTI (urinary tract infection) Active Problems:   Acute metabolic encephalopathy   Microcytic anemia   Essential hypertension   Type 2 diabetes mellitus (HCC)   Severe dementia (HCC)   Pressure injury of skin    Discharge Condition: Improved.  Diet recommendation: Regular.  Wound care: None.  Code status: Full.   History of Present Illness:   68 year old female with past medical history of diabetes mellitus type 2, hypertension presented to hospital on 08/12/2023 with complaints of confusion, visual and auditory hallucinations.  She was found to have bedbugs with multiple bites and was in an unkempt state.  Admission laboratories were unrevealing but UDS was positive for benzodiazepines and UA was equivocal.  CT head was unrevealing.Over the course of her prolonged hospital stay she has continued to experience episodes of confusion and agitation.  The patient's family reported that they had noted a significant cognitive decline dating back at least 2 months.  EEG was negative for seizure activity.  MRI of the brain was without significant acute findings.  CT chest abdomen and pelvis did not reveal any acute pathology.  A fluoroscopically guided LP was able to be completed on 1/30 and there was no evidence consistent with a meningitis.  At this time patient is awaiting for disposition to memory care unit.   Hospital Course:   Following conditions were addressed during hospitalization as listed below,  Severe  dementia Family reports cognitive decline dating back at least a year with rapid worsening over the last 2 months - extensive workup with no evidence of a reversible etiology, stable on Zyprexa and Haldol.  Awaiting for memory care unit placement.  Keeps walking in the hallway.  Metabolic encephalopathy likely secondary to acute cystitis on the background of dementia.   Pyuria - resolved  Patient has completed course of antibiotic   AKI - resolved  Patient prerenal secondary to volume depletion and poor oral intake.   Diabetes mellitus type 2 Latest A1c 6.2 despite recent noncompliance with medications, relatively controlled at this time. On regular diet.    HTN On metoprolol.  Continue, BP is controlled.    Microcytic anemia Patient with vitamin B12 deficiency.  Continue vitamin B12   Bedbug infestation - resolved  Pressure injury.  Left elbow stage II, right elbow stage II.  Continue pressure injury prevention protocol. Pressure Injury 08/21/23 Elbow Left;Posterior Stage 2 -  Partial thickness loss of dermis presenting as a shallow open injury with a red, pink wound bed without slough. (Active)  08/21/23 1346  Location: Elbow  Location Orientation: Left;Posterior  Staging: Stage 2 -  Partial thickness loss of dermis presenting as a shallow open injury with a red, pink wound bed without slough.  Wound Description (Comments):   Present on Admission: No     Pressure Injury 08/21/23 Elbow Posterior;Right Stage 2 -  Partial thickness loss of dermis presenting as a shallow open injury with a red, pink wound bed without slough. (Active)  08/21/23 1348  Location: Elbow  Location Orientation: Posterior;Right  Staging: Stage 2 -  Partial thickness loss of dermis presenting as a shallow open injury with a red, pink wound bed without slough.  Wound Description (Comments):   Present on Admission: No     Disposition.  At this time, patient is stable for disposition skilled nursing  facility when a bed is found.  Medical Consultants:   Interventional radiology Psychiatry Neurolog  Procedures:   EEG Lumbar puncture Subjective:   Today, patient was seen and examined at bedside walking in hallway.  No interval complaints.  Discharge Exam:   Vitals:   09/11/23 2041 09/12/23 1016  BP: 105/71 129/86  Pulse: (!) 51 97  Resp: 17 18  Temp: 97.8 F (36.6 C)   SpO2: 93% 97%   Vitals:   09/10/23 2044 09/11/23 0518 09/11/23 2041 09/12/23 1016  BP: (!) 127/107 (!) 127/58 105/71 129/86  Pulse: 86 67 (!) 51 97  Resp: 16 16 17 18   Temp: 98 F (36.7 C) 98 F (36.7 C) 97.8 F (36.6 C)   TempSrc:   Oral   SpO2: 100% 100% 93% 97%  Weight:      Height:        General: Alert awake, not in obvious distress disoriented, walking in the hallway..  Significant cognitive impairment HENT: pupils equally reacting to light,  No scleral pallor or icterus noted. Oral mucosa is moist.  Chest:  Clear breath sounds. . No crackles or wheezes.  CVS: S1 &S2 heard. No murmur.  Regular rate and rhythm. Abdomen: Soft, nontender, nondistended.  Bowel sounds are heard.   Extremities: No cyanosis, clubbing or edema.  Peripheral pulses are palpable. Psych: Alert, awake Communicative, disoriented CNS:  No cranial nerve deficits.  Power equal in all extremities.   Skin: Warm and dry.  No rashes noted.  The results of significant diagnostics from this hospitalization (including imaging, microbiology, ancillary and laboratory) are listed below for reference.     Diagnostic Studies:   CT Head Wo Contrast Result Date: 08/12/2023 CLINICAL DATA:  Mental status change confusion EXAM: CT HEAD WITHOUT CONTRAST TECHNIQUE: Contiguous axial images were obtained from the base of the skull through the vertex without intravenous contrast. RADIATION DOSE REDUCTION: This exam was performed according to the departmental dose-optimization program which includes automated exposure control, adjustment of  the mA and/or kV according to patient size and/or use of iterative reconstruction technique. COMPARISON:  None Available. FINDINGS: Brain: No acute territorial infarction, hemorrhage or intracranial mass. Mild atrophy. Minimal white matter hypodensity. Nonenlarged ventricles Vascular: No hyperdense vessels.  No unexpected calcification Skull: Normal. Negative for fracture or focal lesion. Sinuses/Orbits: No acute finding. Other: None IMPRESSION: No CT evidence for acute intracranial abnormality. Mild atrophy. Electronically Signed   By: Jasmine Pang M.D.   On: 08/12/2023 19:57   DG Chest 2 View Result Date: 08/12/2023 CLINICAL DATA:  Weakness and confusion. EXAM: CHEST - 2 VIEW COMPARISON:  None Available. FINDINGS: The cardiomediastinal contours are normal. Pulmonary vasculature is normal. No consolidation, pleural effusion, or pneumothorax. No acute osseous abnormalities are seen. IMPRESSION: No active cardiopulmonary disease. Electronically Signed   By: Narda Rutherford M.D.   On: 08/12/2023 18:25     Labs:   Basic Metabolic Panel: Recent Labs  Lab 09/06/23 0656  NA 140  K 4.2  CL 106  CO2 23  GLUCOSE 101*  BUN 17  CREATININE 0.83  CALCIUM 9.2   GFR Estimated Creatinine Clearance: 58.9 mL/min (by C-G formula based on SCr of 0.83 mg/dL). Liver Function Tests: No results for input(s): "  AST", "ALT", "ALKPHOS", "BILITOT", "PROT", "ALBUMIN" in the last 168 hours. No results for input(s): "LIPASE", "AMYLASE" in the last 168 hours. No results for input(s): "AMMONIA" in the last 168 hours. Coagulation profile No results for input(s): "INR", "PROTIME" in the last 168 hours.  CBC: Recent Labs  Lab 09/06/23 0656  WBC 4.8  HGB 11.3*  HCT 32.6*  MCV 76.7*  PLT 210   Cardiac Enzymes: No results for input(s): "CKTOTAL", "CKMB", "CKMBINDEX", "TROPONINI" in the last 168 hours. BNP: Invalid input(s): "POCBNP" CBG: No results for input(s): "GLUCAP" in the last 168 hours. D-Dimer No  results for input(s): "DDIMER" in the last 72 hours. Hgb A1c No results for input(s): "HGBA1C" in the last 72 hours. Lipid Profile No results for input(s): "CHOL", "HDL", "LDLCALC", "TRIG", "CHOLHDL", "LDLDIRECT" in the last 72 hours. Thyroid function studies No results for input(s): "TSH", "T4TOTAL", "T3FREE", "THYROIDAB" in the last 72 hours.  Invalid input(s): "FREET3" Anemia work up No results for input(s): "VITAMINB12", "FOLATE", "FERRITIN", "TIBC", "IRON", "RETICCTPCT" in the last 72 hours. Microbiology No results found for this or any previous visit (from the past 240 hours).   Discharge Instructions:   Discharge Instructions     Call MD for:  redness, tenderness, or signs of infection (pain, swelling, redness, odor or green/yellow discharge around incision site)   Complete by: As directed    Call MD for:  severe uncontrolled pain   Complete by: As directed    Call MD for:  temperature >100.4   Complete by: As directed    Diet general   Complete by: As directed    Discharge instructions   Complete by: As directed    Follow-up with your primary care provider at the skilled nursing facility in 3 to 5 days.  Continue to take medications as prescribed.  Seek medical attention for worsening symptoms.   Discharge wound care:   Complete by: As directed    Continue pressure Injury prevention protocol.   Increase activity slowly   Complete by: As directed       Allergies as of 09/12/2023       Reactions   Amoxicillin    Aspirin Hives   Claritin [loratadine]    Ibuprofen    Peanut-containing Drug Products    Penicillins    Shellfish-derived Products    Strawberry (diagnostic)         Medication List     STOP taking these medications    cetirizine 10 MG tablet Commonly known as: ZYRTEC   cyclobenzaprine 10 MG tablet Commonly known as: FLEXERIL   lisinopril-hydrochlorothiazide 20-12.5 MG tablet Commonly known as: ZESTORETIC   metFORMIN 500 MG 24 hr  tablet Commonly known as: GLUCOPHAGE-XR Replaced by: metFORMIN 500 MG tablet   naproxen 500 MG tablet Commonly known as: NAPROSYN   Proventil HFA 108 (90 Base) MCG/ACT inhaler Generic drug: albuterol       TAKE these medications    acetaminophen 325 MG tablet Commonly known as: TYLENOL Take 2 tablets (650 mg total) by mouth every 6 (six) hours as needed for mild pain (pain score 1-3) (or Fever >/= 101).   cyanocobalamin 1000 MCG tablet Take 1 tablet (1,000 mcg total) by mouth daily.   haloperidol 2 MG tablet Commonly known as: HALDOL Take 1 tablet (2 mg total) by mouth every 8 (eight) hours as needed (restlessness).   hydrOXYzine 25 MG tablet Commonly known as: ATARAX Take 1 tablet (25 mg total) by mouth 3 (three) times daily as needed for  anxiety.   metFORMIN 500 MG tablet Commonly known as: GLUCOPHAGE Take 1 tablet (500 mg total) by mouth 2 (two) times daily with a meal. Replaces: metFORMIN 500 MG 24 hr tablet   metoprolol tartrate 25 MG tablet Commonly known as: LOPRESSOR Take 0.5 tablets (12.5 mg total) by mouth 2 (two) times daily.   OLANZapine 7.5 MG tablet Commonly known as: ZYPREXA Take 1 tablet (7.5 mg total) by mouth in the morning AND 2 tablets (15 mg total) at bedtime.   vitamin D3 25 MCG tablet Commonly known as: CHOLECALCIFEROL Take 2 tablets (2,000 Units total) by mouth daily.               Discharge Care Instructions  (From admission, onward)           Start     Ordered   09/12/23 0000  Discharge wound care:       Comments: Continue pressure Injury prevention protocol.   09/12/23 0959              Time coordinating discharge: 39 minutes  Signed:  Jaci Desanto  [facility] 09/12/2023, 11:22 AM

## 2023-09-12 NOTE — Plan of Care (Signed)
   Problem: Health Behavior/Discharge Planning: Goal: Ability to manage health-related needs will improve Outcome: Progressing   Problem: Clinical Measurements: Goal: Ability to maintain clinical measurements within normal limits will improve Outcome: Progressing Goal: Will remain free from infection Outcome: Progressing   Problem: Activity: Goal: Risk for activity intolerance will decrease Outcome: Progressing

## 2023-09-12 NOTE — [place] FL2 (Signed)
 [place] MEDICAID FL2 LEVEL OF CARE FORM     IDENTIFICATION  Patient Name: Shannon Barrett Birthdate: 10-30-1955 Sex: female Admission Date (Current Location): 08/12/2023  [facility] and [place] Number:  Producer, television/film/video and Address:  [facility], [address], [place], [place] [address]      Provider Number: [id number]  Attending Physician Name and Address:  Joycelyn Das, MD  Relative Name and Phone Number:  Caryl Ada [phone]    Current Level of Care: Hospital Recommended Level of Care: Memory Care, Other (Comment) (SCU) Prior Approval Number:    Date Approved/Denied:   PASRR Number: N/A  Discharge Plan: Other (Comment) (Memory Care SCU)    Current Diagnoses: Patient Active Problem List   Diagnosis Date Noted   Severe dementia (HCC) 09/12/2023   Pressure injury of skin 09/12/2023   Acute metabolic encephalopathy 08/13/2023   Microcytic anemia 08/13/2023   Essential hypertension 08/13/2023   Type 2 diabetes mellitus (HCC) 08/13/2023   UTI (urinary tract infection) 08/12/2023    Orientation RESPIRATION BLADDER Height & Weight     Self  Normal Continent Weight: 143 lb 1.3 oz (64.9 kg) Height:  5\' 2"  (157.5 cm)  BEHAVIORAL SYMPTOMS/MOOD NEUROLOGICAL BOWEL NUTRITION STATUS  Wanderer   Continent Diet (Regular)  AMBULATORY STATUS COMMUNICATION OF NEEDS Skin   Supervision Verbally PU Stage and Appropriate Care (Bilateral Elbow PU St 2)                       Personal Care Assistance Level of Assistance  Bathing, Feeding, Dressing Bathing Assistance: Limited assistance Feeding assistance: Limited assistance Dressing Assistance: Limited assistance     Functional Limitations Info  Sight, Hearing, Speech Sight Info: Adequate Hearing Info: Adequate Speech Info: Adequate    SPECIAL CARE FACTORS FREQUENCY  PT (By licensed PT), OT (By licensed OT)     PT Frequency: 1x week OT Frequency: 1x week             Contractures Contractures Info: Not present    Additional Factors Info  Code Status, Allergies, Psychotropic Code Status Info: Full Allergies Info: Amoxicillin  Aspirin  Claritin (Loratadine)  Ibuprofen  Peanut-containing Drug Products  Penicillins  Shellfish-derived Products  Strawberry (Diagnostic) Psychotropic Info: Olanzapine Insulin Sliding Scale Info: insulin aspart (novoLOG) injection 0-6 Units  Dose: 0-6 Units  Freq: 3 times daily with meals Route: [place]       Current Medications (09/12/2023):  This is the current hospital active medication list Current Facility-Administered Medications  Medication Dose Route Frequency Provider Last Rate Last Admin   acetaminophen (TYLENOL) tablet 650 mg  650 mg Oral Q6H PRN Adefeso, Oladapo, DO   650 mg at 09/06/23 1101   cholecalciferol (VITAMIN D3) 25 MCG (1000 UNIT) tablet 2,000 Units  2,000 Units Oral Daily Candelaria Stagers T, MD   2,000 Units at 09/12/23 1011   cyanocobalamin (VITAMIN B12) tablet 1,000 mcg  1,000 mcg Oral Daily Candelaria Stagers T, MD   1,000 mcg at 09/12/23 1011   enoxaparin (LOVENOX) injection 40 mg  40 mg Subcutaneous Q24H Pokhrel, Laxman, MD   40 mg at 09/09/23 1045   feeding supplement (ENSURE ENLIVE / ENSURE PLUS) liquid 237 mL  237 mL Oral BID BM Gonfa, Taye T, MD   237 mL at 09/08/23 1522   haloperidol (HALDOL) tablet 2 mg  2 mg Oral Q8H PRN Pokhrel, Laxman, MD       haloperidol (HALDOL) tablet 2  mg  2 mg Oral Q8H PRN Pokhrel, Laxman, MD       Or   haloperidol lactate (HALDOL) injection 2 mg  2 mg Intramuscular Q8H PRN Pokhrel, Laxman, MD   2 mg at 09/10/23 1435   hydrOXYzine (ATARAX) tablet 25 mg  25 mg Oral TID PRN Lonia Blood, MD   25 mg at 09/10/23 1155   metFORMIN (GLUCOPHAGE) tablet 500 mg  500 mg Oral BID WC Candelaria Stagers T, MD   500 mg at 09/12/23 1011   metoprolol tartrate (LOPRESSOR) tablet 12.5 mg  12.5 mg Oral BID Jetty Duhamel T, MD   12.5 mg at 09/12/23 1017   OLANZapine (ZYPREXA) tablet 7.5 mg  7.5 mg  Oral q AM Lonia Blood, MD   7.5 mg at 09/12/23 1010   And   OLANZapine (ZYPREXA) tablet 15 mg  15 mg Oral QHS Lonia Blood, MD   15 mg at 09/11/23 2125   ondansetron (ZOFRAN) tablet 4 mg  4 mg Oral Q6H PRN Adefeso, Oladapo, DO       Or   ondansetron (ZOFRAN) injection 4 mg  4 mg Intravenous Q6H PRN Adefeso, Oladapo, DO       Oral care mouth rinse  15 mL Mouth Rinse PRN Almon Hercules, MD         Discharge Medications:  acetaminophen 325 MG tablet Commonly known as: TYLENOL Take 2 tablets (650 mg total) by mouth every 6 (six) hours as needed for mild pain (pain score 1-3) (or Fever >/= 101).    amLODipine 10 MG tablet Commonly known as: NORVASC Take 1 tablet (10 mg total) by mouth daily.    cyanocobalamin 1000 MCG tablet Take 1 tablet (1,000 mcg total) by mouth daily.    haloperidol 2 MG tablet Commonly known as: HALDOL Take 1 tablet (2 mg total) by mouth every 8 (eight) hours as needed (restlessness).    hydrOXYzine 25 MG tablet Commonly known as: ATARAX Take 1 tablet (25 mg total) by mouth 3 (three) times daily as needed for anxiety.    metFORMIN 500 MG tablet Commonly known as: GLUCOPHAGE Take 1 tablet (500 mg total) by mouth 2 (two) times daily with a meal. Replaces: metFORMIN 500 MG 24 hr tablet    metoprolol tartrate 25 MG tablet Commonly known as: LOPRESSOR Take 0.5 tablets (12.5 mg total) by mouth 2 (two) times daily.    OLANZapine 7.5 MG tablet Commonly known as: ZYPREXA Take 1 tablet (7.5 mg total) by mouth in the morning AND 2 tablets (15 mg total) at bedtime.    vitamin D3 25 MCG tablet Commonly known as: CHOLECALCIFEROL Take 2 tablets (2,000 Units total) by mouth daily.           Relevant Imaging Results:  Relevant Lab Results:   Additional Information SS# [id number] [place]

## 2023-09-13 NOTE — Progress Notes (Signed)
 Patient wheeled to main entrance where she was picked up by her niece to d/c to [facility]. Report called.

## 2023-09-13 NOTE — Plan of Care (Signed)
  Problem: Health Behavior/Discharge Planning: Goal: Ability to manage health-related needs will improve 09/13/2023 1412 by Karolee Ohs, RN Outcome: Adequate for Discharge 09/13/2023 1411 by Karolee Ohs, RN Outcome: Progressing   Problem: Clinical Measurements: Goal: Ability to maintain clinical measurements within normal limits will improve 09/13/2023 1412 by Karolee Ohs, RN Outcome: Adequate for Discharge 09/13/2023 1411 by Karolee Ohs, RN Outcome: Progressing Goal: Will remain free from infection 09/13/2023 1412 by Karolee Ohs, RN Outcome: Adequate for Discharge 09/13/2023 1411 by Karolee Ohs, RN Outcome: Progressing Goal: Diagnostic test results will improve Outcome: Adequate for Discharge   Problem: Activity: Goal: Risk for activity intolerance will decrease Outcome: Adequate for Discharge   Problem: Skin Integrity: Goal: Risk for impaired skin integrity will decrease Outcome: Adequate for Discharge   Problem: Education: Goal: Ability to describe self-care measures that may prevent or decrease complications (Diabetes Survival Skills Education) will improve Outcome: Adequate for Discharge Goal: Individualized Educational Video(s) Outcome: Adequate for Discharge   Problem: Coping: Goal: Ability to adjust to condition or change in health will improve Outcome: Adequate for Discharge   Problem: Fluid Volume: Goal: Ability to maintain a balanced intake and output will improve Outcome: Adequate for Discharge   Problem: Health Behavior/Discharge Planning: Goal: Ability to identify and utilize available resources and services will improve Outcome: Adequate for Discharge Goal: Ability to manage health-related needs will improve Outcome: Adequate for Discharge   Problem: Metabolic: Goal: Ability to maintain appropriate glucose levels will improve Outcome: Adequate for Discharge   Problem: Nutritional: Goal: Maintenance of adequate nutrition will  improve Outcome: Adequate for Discharge Goal: Progress toward achieving an optimal weight will improve Outcome: Adequate for Discharge   Problem: Skin Integrity: Goal: Risk for impaired skin integrity will decrease Outcome: Adequate for Discharge   Problem: Tissue Perfusion: Goal: Adequacy of tissue perfusion will improve Outcome: Adequate for Discharge   Problem: SLP Cognition Goals Goal: Patient will demonstrate attention to functional Description: Patient will demonstrate attention to functional task with Outcome: Adequate for Discharge Goal: Patient will demonstrate problem solving skills Description: Patient will demonstrate problem solving skills during functional ADL's with Outcome: Adequate for Discharge Goal: Patient will demonstrate awareness during Description: Patient will demonstrate awareness during functional ADL for improved safety Outcome: Adequate for Discharge Goal: Misc Cognitive Goal #1 Outcome: Adequate for Discharge   Problem: Acute Rehab PT Goals(only PT should resolve) Goal: Pt Will Go Up/Down Stairs Outcome: Adequate for Discharge   Problem: Acute Rehab OT Goals (only OT should resolve) Goal: Pt. Will Perform Grooming Outcome: Adequate for Discharge Goal: Pt. Will Perform Lower Body Dressing Outcome: Adequate for Discharge Goal: Pt. Will Transfer To Toilet Outcome: Adequate for Discharge Goal: Pt. Will Perform Toileting-Clothing Manipulation Outcome: Adequate for Discharge Goal: OT Additional ADL Goal #1 Outcome: Adequate for Discharge   Problem: Acute Rehab OT Goals (only OT should resolve) Goal: Pt. Will Perform Grooming Outcome: Adequate for Discharge Goal: Pt. Will Perform Lower Body Dressing Outcome: Adequate for Discharge Goal: Pt. Will Transfer To Toilet Outcome: Adequate for Discharge Goal: Pt. Will Perform Toileting-Clothing Manipulation Outcome: Adequate for Discharge Goal: OT Additional ADL Goal #1 Outcome: Adequate for  Discharge

## 2023-09-13 NOTE — Plan of Care (Signed)
  Problem: Health Behavior/Discharge Planning: Goal: Ability to manage health-related needs will improve Outcome: Progressing   Problem: Activity: Goal: Risk for activity intolerance will decrease Outcome: Progressing   Problem: Coping: Goal: Ability to adjust to condition or change in health will improve Outcome: Progressing   Problem: Health Behavior/Discharge Planning: Goal: Ability to manage health-related needs will improve Outcome: Progressing

## 2023-09-13 NOTE — TOC Transition Note (Signed)
 Transition of Care [facility]) - Discharge Note   Patient Details  Name: Shannon Barrett MRN: [id number] Date of Birth: [birth year]  [facility] CM/SW Contact:  Carley Hammed, LCSW Phone Number: 09/13/2023, 1:17 PM   Clinical Narrative:    Pt to be transported to [facility] via [facility]. Nurse to call report to [phone].   Final next level of care: Memory Care Barriers to Discharge: Barriers Resolved   Patient Goals and CMS Choice Patient states their goals for this hospitalization and ongoing recovery are:: Pt disoriented and unable to participate in goal setting. CMS Medicare.gov Compare Post Acute Care list provided to:: Patient Represenative (must comment) Choice offered to / list presented to : Adult Children, HC POA / Guardian      Discharge Placement              Patient chooses bed at: [facility] Patient to be transferred to facility by: [facility] Name of family member notified: [facility] Patient and family notified of of transfer: 09/13/23  Discharge Plan and Services Additional resources added to the After Visit Summary for   In-house Referral: Clinical Social Work   Post Acute Care Choice:  (ALF/ [facility])                               Social Drivers of Health (SDOH) Interventions SDOH Screenings   Food Insecurity: Patient Unable To Answer (08/13/2023)  Housing: Patient Unable To Answer (08/13/2023)  Transportation Needs: Patient Unable To Answer (08/13/2023)  Utilities: Patient Unable To Answer (08/13/2023)  Social Connections: Unknown (08/13/2023)  Tobacco Use: Low Risk  (08/12/2023)     Readmission Risk Interventions     No data to display

## 2023-09-13 NOTE — Progress Notes (Signed)
 Patient seen and examined at bedside.  Sleeping quietly in bed.  No interval changes.  Medically stable for disposition to skilled nursing facility.  Please refer to discharge instructions done 09/12/2023.

## 2023-09-13 NOTE — Plan of Care (Signed)
   Problem: Health Behavior/Discharge Planning: Goal: Ability to manage health-related needs will improve Outcome: Progressing   Problem: Clinical Measurements: Goal: Ability to maintain clinical measurements within normal limits will improve Outcome: Progressing Goal: Will remain free from infection Outcome: Progressing
